# Patient Record
Sex: Male | Born: 1935 | Race: White | Hispanic: No | Marital: Married | Smoking: Never smoker
Health system: Southern US, Community
[De-identification: ages and names within clinical notes are randomized; demographics above are authoritative.]

## PROBLEM LIST (undated history)

## (undated) DIAGNOSIS — E785 Hyperlipidemia, unspecified: Secondary | ICD-10-CM

## (undated) DIAGNOSIS — K5901 Slow transit constipation: Secondary | ICD-10-CM

## (undated) DIAGNOSIS — D62 Acute posthemorrhagic anemia: Secondary | ICD-10-CM

## (undated) DIAGNOSIS — S7291XA Unspecified fracture of right femur, initial encounter for closed fracture: Secondary | ICD-10-CM

## (undated) DIAGNOSIS — I2510 Atherosclerotic heart disease of native coronary artery without angina pectoris: Secondary | ICD-10-CM

## (undated) DIAGNOSIS — I10 Essential (primary) hypertension: Secondary | ICD-10-CM

## (undated) DIAGNOSIS — E559 Vitamin D deficiency, unspecified: Secondary | ICD-10-CM

## (undated) DIAGNOSIS — F419 Anxiety disorder, unspecified: Secondary | ICD-10-CM

## (undated) DIAGNOSIS — R339 Retention of urine, unspecified: Secondary | ICD-10-CM

## (undated) DIAGNOSIS — R2681 Unsteadiness on feet: Secondary | ICD-10-CM

## (undated) DIAGNOSIS — F0390 Unspecified dementia without behavioral disturbance: Secondary | ICD-10-CM

## (undated) HISTORY — DX: Atherosclerotic heart disease of native coronary artery without angina pectoris: I25.10

## (undated) HISTORY — DX: Unspecified fracture of right femur, initial encounter for closed fracture: S72.91XA

## (undated) HISTORY — DX: Unsteadiness on feet: R26.81

## (undated) HISTORY — DX: Retention of urine, unspecified: R33.9

## (undated) HISTORY — DX: Vitamin D deficiency, unspecified: E55.9

## (undated) HISTORY — DX: Anxiety disorder, unspecified: F41.9

## (undated) HISTORY — DX: Acute posthemorrhagic anemia: D62

## (undated) HISTORY — PX: INGUINAL HERNIA REPAIR: SUR1180

## (undated) HISTORY — PX: REPLACEMENT TOTAL KNEE BILATERAL: SUR1225

## (undated) HISTORY — DX: Hyperlipidemia, unspecified: E78.5

## (undated) HISTORY — DX: Slow transit constipation: K59.01

## (undated) HISTORY — DX: Essential (primary) hypertension: I10

---

## 2001-04-17 ENCOUNTER — Ambulatory Visit (HOSPITAL_COMMUNITY): Admission: RE | Admit: 2001-04-17 | Discharge: 2001-04-17 | Payer: Self-pay | Admitting: *Deleted

## 2002-11-14 ENCOUNTER — Ambulatory Visit (HOSPITAL_COMMUNITY): Admission: RE | Admit: 2002-11-14 | Discharge: 2002-11-14 | Payer: Self-pay | Admitting: Surgery

## 2002-11-14 ENCOUNTER — Encounter: Payer: Self-pay | Admitting: Surgery

## 2003-04-05 HISTORY — PX: CORONARY ANGIOPLASTY WITH STENT PLACEMENT: SHX49

## 2003-08-19 ENCOUNTER — Inpatient Hospital Stay (HOSPITAL_COMMUNITY): Admission: EM | Admit: 2003-08-19 | Discharge: 2003-08-22 | Payer: Self-pay | Admitting: *Deleted

## 2003-09-08 ENCOUNTER — Encounter (HOSPITAL_COMMUNITY): Admission: RE | Admit: 2003-09-08 | Discharge: 2003-12-07 | Payer: Self-pay | Admitting: Cardiovascular Disease

## 2003-12-08 ENCOUNTER — Encounter (HOSPITAL_COMMUNITY): Admission: RE | Admit: 2003-12-08 | Discharge: 2004-03-07 | Payer: Self-pay | Admitting: Cardiovascular Disease

## 2004-02-06 ENCOUNTER — Ambulatory Visit: Payer: Self-pay | Admitting: Cardiovascular Disease

## 2004-07-21 ENCOUNTER — Ambulatory Visit: Payer: Self-pay | Admitting: Cardiovascular Disease

## 2004-07-22 ENCOUNTER — Ambulatory Visit: Payer: Self-pay

## 2004-08-03 ENCOUNTER — Ambulatory Visit: Payer: Self-pay | Admitting: Cardiovascular Disease

## 2004-08-20 ENCOUNTER — Ambulatory Visit: Payer: Self-pay | Admitting: Cardiovascular Disease

## 2005-02-04 ENCOUNTER — Ambulatory Visit: Payer: Self-pay | Admitting: Cardiovascular Disease

## 2005-05-03 ENCOUNTER — Ambulatory Visit: Payer: Self-pay | Admitting: Cardiovascular Disease

## 2005-06-29 ENCOUNTER — Ambulatory Visit: Payer: Self-pay | Admitting: Cardiovascular Disease

## 2005-10-17 ENCOUNTER — Ambulatory Visit (HOSPITAL_COMMUNITY): Admission: RE | Admit: 2005-10-17 | Discharge: 2005-10-17 | Payer: Self-pay | Admitting: Chiropractic Medicine

## 2006-01-02 ENCOUNTER — Ambulatory Visit: Payer: Self-pay | Admitting: Cardiovascular Disease

## 2006-07-12 ENCOUNTER — Ambulatory Visit: Payer: Self-pay | Admitting: Cardiovascular Disease

## 2006-07-26 ENCOUNTER — Encounter: Admission: RE | Admit: 2006-07-26 | Discharge: 2006-07-26 | Payer: Self-pay | Admitting: Family Medicine

## 2006-09-06 ENCOUNTER — Inpatient Hospital Stay (HOSPITAL_COMMUNITY): Admission: RE | Admit: 2006-09-06 | Discharge: 2006-09-07 | Payer: Self-pay | Admitting: Neurosurgery

## 2006-10-04 ENCOUNTER — Ambulatory Visit: Payer: Self-pay | Admitting: Cardiovascular Disease

## 2006-11-27 ENCOUNTER — Ambulatory Visit: Payer: Self-pay | Admitting: Cardiovascular Disease

## 2006-11-27 ENCOUNTER — Ambulatory Visit: Payer: Self-pay

## 2007-04-17 ENCOUNTER — Ambulatory Visit: Payer: Self-pay | Admitting: Cardiovascular Disease

## 2007-08-29 ENCOUNTER — Emergency Department (HOSPITAL_COMMUNITY): Admission: EM | Admit: 2007-08-29 | Discharge: 2007-08-29 | Payer: Self-pay | Admitting: Emergency Medicine

## 2008-01-25 ENCOUNTER — Ambulatory Visit: Payer: Self-pay | Admitting: Cardiovascular Disease

## 2008-03-10 ENCOUNTER — Inpatient Hospital Stay (HOSPITAL_COMMUNITY): Admission: RE | Admit: 2008-03-10 | Discharge: 2008-03-14 | Payer: Self-pay | Admitting: Orthopedic Surgery

## 2008-05-23 ENCOUNTER — Encounter: Payer: Self-pay | Admitting: Cardiovascular Disease

## 2008-05-23 LAB — CONVERTED CEMR LAB
AST: 17 units/L
Total Bilirubin: 0.85 mg/dL

## 2008-07-17 ENCOUNTER — Telehealth: Payer: Self-pay | Admitting: Cardiovascular Disease

## 2008-07-21 ENCOUNTER — Encounter: Payer: Self-pay | Admitting: Cardiovascular Disease

## 2008-07-21 ENCOUNTER — Ambulatory Visit: Payer: Self-pay | Admitting: Cardiovascular Disease

## 2008-07-21 DIAGNOSIS — Z9889 Other specified postprocedural states: Secondary | ICD-10-CM | POA: Insufficient documentation

## 2008-07-21 DIAGNOSIS — E782 Mixed hyperlipidemia: Secondary | ICD-10-CM

## 2008-07-21 DIAGNOSIS — I10 Essential (primary) hypertension: Secondary | ICD-10-CM | POA: Insufficient documentation

## 2008-07-21 DIAGNOSIS — I2510 Atherosclerotic heart disease of native coronary artery without angina pectoris: Secondary | ICD-10-CM

## 2008-07-21 LAB — CONVERTED CEMR LAB
HDL: 68 mg/dL
LDL Cholesterol: 41 mg/dL
Total CHOL/HDL Ratio: 1.7

## 2008-12-11 ENCOUNTER — Encounter (INDEPENDENT_AMBULATORY_CARE_PROVIDER_SITE_OTHER): Payer: Self-pay | Admitting: *Deleted

## 2009-01-23 ENCOUNTER — Encounter: Payer: Self-pay | Admitting: Cardiovascular Disease

## 2009-01-28 ENCOUNTER — Ambulatory Visit: Payer: Self-pay | Admitting: Cardiovascular Disease

## 2009-03-18 ENCOUNTER — Telehealth: Payer: Self-pay | Admitting: Internal Medicine

## 2009-05-15 ENCOUNTER — Encounter: Payer: Self-pay | Admitting: Cardiovascular Disease

## 2009-06-04 ENCOUNTER — Encounter (INDEPENDENT_AMBULATORY_CARE_PROVIDER_SITE_OTHER): Payer: Self-pay | Admitting: *Deleted

## 2009-07-20 ENCOUNTER — Telehealth (INDEPENDENT_AMBULATORY_CARE_PROVIDER_SITE_OTHER): Payer: Self-pay | Admitting: *Deleted

## 2009-07-23 ENCOUNTER — Ambulatory Visit: Payer: Self-pay | Admitting: Cardiovascular Disease

## 2009-07-23 ENCOUNTER — Telehealth (INDEPENDENT_AMBULATORY_CARE_PROVIDER_SITE_OTHER): Payer: Self-pay | Admitting: *Deleted

## 2010-01-19 ENCOUNTER — Ambulatory Visit: Payer: Self-pay | Admitting: Cardiovascular Disease

## 2010-01-27 ENCOUNTER — Telehealth (INDEPENDENT_AMBULATORY_CARE_PROVIDER_SITE_OTHER): Payer: Self-pay | Admitting: *Deleted

## 2010-03-18 ENCOUNTER — Ambulatory Visit: Payer: Self-pay | Admitting: Cardiovascular Disease

## 2010-05-04 ENCOUNTER — Telehealth: Payer: Self-pay | Admitting: Cardiovascular Disease

## 2010-05-04 NOTE — Progress Notes (Signed)
  Phone Note Outgoing Call   Call placed by: Deliah Goody, RN,  July 23, 2009 1:23 PM Summary of Call: pt here for appt today and was unsure of meds. pt called and meds confirmed. pt states as of today dr Eden Emms had told him to stop the amlodipine. wioll foward to dr Eden Emms to review. Deliah Goody, RN  July 23, 2009 1:24 PM   Follow-up for Phone Call        ok to stop amlodimpine Follow-up by: Colon Branch, MD, [facility],  July 24, 2009 11:54 AM     Appended Document:  pt aware

## 2010-05-04 NOTE — Assessment & Plan Note (Signed)
Summary: F6M/DM   CC:  no compliants.  History of Present Illness: Austin Gallagher is seen today for f/U of CAD.  He had a stent to the RCA in 2005.  He has had both knees replaced the left most recently last year by Dr. Despina Hick.  He is active walking his dog Beauregard.  He is not having SSCP, palpitations, dyspnea or syncope  His recent LDL is 49 with normal LFT's   His BP meds have been changed by Dr. Ronne Binning   He thinks his Cozaar is making him itchy and wonders if his memory is worse on statin Rx  Current Problems (verified): 1)  Total Knee Replacement, Hx of  (ICD-V45.89) 2)  Mixed Hyperlipidemia  (ICD-272.2) 3)  Essential Hypertension, Benign  (ICD-401.1) 4)  Coronary Atherosclerosis, Native Vessel  (ICD-414.01)  Current Medications (verified): 1)  Plavix 75 Mg Tabs (Clopidogrel Bisulfate) .... Take 1 Tablet By Mouth Once A Day 2)  Toprol Xl 25 Mg Xr24h-Tab (Metoprolol Succinate) .... Take 1/2 By Mouth Once Daily 3)  Vitamin C 500 Mg Tabs (Ascorbic Acid) .Marland Kitchen.. 1 Tab By Mouth Once Daily 4)  Amlodipine Besylate 10 Mg Tabs (Amlodipine Besylate) .Marland Kitchen.. 1 Once Daily  Allergies (verified): No Known Drug Allergies  Past History:  Past Medical History: Last updated: 07/21/2008 CAD:  Stent RCA 2005 Downey IMI Bilateral knee replacement Inguinal Hernia Repair Hyperlipidemia  Family History: Last updated: 07/21/2008 non-contributory  Social History: Last updated: 07/21/2008 Married Daughter lives on [address] Non-smoker Non-drinker Enjoys golf  Review of Systems       Denies fever, malais, weight loss, blurry vision, decreased visual acuity, cough, sputum, SOB, hemoptysis, pleuritic pain, palpitaitons, heartburn, abdominal pain, melena, lower extremity edema, claudication, or rash.   Vital Signs:  Patient profile:   77 year old male Height:      66 inches Weight:      133 pounds BMI:     21.54 Pulse rate:   59 / minute Resp:     14 per minute BP sitting:   143 / 72   (left arm)  Vitals Entered By: Kem Parkinson (January 19, 2010 2:27 PM)  Physical Exam  General:  Affect appropriate Healthy:  appears stated age HEENT: normal Neck supple with no adenopathy JVP normal no bruits no thyromegaly Lungs clear with no wheezing and good diaphragmatic motion Heart:  S1/S2 no murmur,rub, gallop or click PMI normal Abdomen: benighn, BS positve, no tenderness, no AAA no bruit.  No HSM or HJR Distal pulses intact with no bruits No edema Neuro non-focal Skin warm and dry    Impression & Recommendations:  Problem # 1:  MIXED HYPERLIPIDEMIA (ICD-272.2) Hold lipitor for 8 weeks and see if memory improves.  Sustpect it is more age/dementia related The following medications were removed from the medication list:    Lipitor 40 Mg Tabs (Atorvastatin calcium) .Marland Kitchen... Take one tablet by mouth daily.  Problem # 2:  ESSENTIAL HYPERTENSION, BENIGN (ICD-401.1) D/C Cozzaar.  Issues with Cr/K make calcuim blocker best choice for him The following medications were removed from the medication list:    Hyzaar 100-12.5 Mg Tabs (Losartan potassium-hctz) .Marland Kitchen... Take 1 tablet by mouth once a day    Amlodipine Besylate 5 Mg Tabs (Amlodipine besylate) .Marland Kitchen... 1 tab by mouth once daily His updated medication list for this problem includes:    Toprol Xl 25 Mg Xr24h-tab (Metoprolol succinate) .Marland Kitchen... Take 1/2 by mouth once daily    Amlodipine Besylate 10 Mg Tabs (Amlodipine besylate) .Marland KitchenMarland KitchenMarland KitchenMarland Kitchen  1 once daily  Problem # 5:  CORONARY ATHEROSCLEROSIS, NATIVE VESSEL (ICD-414.01) Stabel no angina continue Plavix and asa The following medications were removed from the medication list:    Amlodipine Besylate 5 Mg Tabs (Amlodipine besylate) .Marland Kitchen... 1 tab by mouth once daily His updated medication list for this problem includes:    Plavix 75 Mg Tabs (Clopidogrel bisulfate) .Marland Kitchen... Take 1 tablet by mouth once a day    Toprol Xl 25 Mg Xr24h-tab (Metoprolol succinate) .Marland Kitchen... Take 1/2 by mouth once  daily    Amlodipine Besylate 10 Mg Tabs (Amlodipine besylate) .Marland Kitchen... 1 once daily  Orders: EKG w/ Interpretation (93000)  Patient Instructions: 1)  Your physician recommends that you schedule a follow-up appointment in: 10 weeks with dr Eden Emms 2)  Your physician has recommended you make the following change in your medication: STOP LIPITOR AND COZAAR 3)  START AMLODIPINE 10 MG Prescriptions: AMLODIPINE BESYLATE 10 MG TABS (AMLODIPINE BESYLATE) 1 once daily  #7 x 0   Entered by:   Scherrie Bateman, LPN   Authorized by:   Colon Branch, MD, [facility]   Signed by:   Scherrie Bateman, LPN on 32/95/1884   Method used:   Electronically to        [facility]* (retail)       [address]       [place], [place]  [address]       Ph: [phone]       Fax: [phone]   RxID:   [id number] AMLODIPINE BESYLATE 10 MG TABS (AMLODIPINE BESYLATE) 1 once daily  #90 x 3   Entered by:   Scherrie Bateman, LPN   Authorized by:   Colon Branch, MD, [facility]   Signed by:   Scherrie Bateman, LPN on 73/71/0626   Method used:   Faxed to ...       [facility] (mail-order)             , [place]         Ph:        Fax: [phone]   RxID:   [id number]    EKG Report  Procedure date:  01/19/2010  Findings:      SR 59 Normal ECG

## 2010-05-04 NOTE — Assessment & Plan Note (Signed)
Summary: f24m/dm   History of Present Illness: Austin Gallagher is seen today for f/U of CAD.  He had a stent to the RCA in 2005.  He has had both knees replaced the left most recently last year by Dr. Despina Hick.  He is active walking his dog Beauregard.  He is not having SSCP, palpitations, dyspnea or syncope  His recent LDL is 49 with normal LFT's   His BP meds have been changed by Dr. Ronne Binning   He may be on both vasotec and Hyzaar.  The enalapril is cause pruritis and throat "tickling"  I told him to stop this and call us with his exact med list.  Hi  Current Medications (verified): 1)  Hyzaar 100-12.5 Mg Tabs (Losartan Potassium-Hctz) .... Take 1 Tablet By Mouth Once A Day 2)  Lipitor 40 Mg Tabs (Atorvastatin Calcium) .... Take One Tablet By Mouth Daily. 3)  Plavix 75 Mg Tabs (Clopidogrel Bisulfate) .... Take 1 Tablet By Mouth Once A Day 4)  Toprol Xl 25 Mg Xr24h-Tab (Metoprolol Succinate) .... Take 1/2 By Mouth Once Daily 5)  Amlodipine Besylate 5 Mg Tabs (Amlodipine Besylate) .Marland Kitchen.. 1 Tab By Mouth Once Daily  Allergies (verified): No Known Drug Allergies  Past History:  Past Medical History: Last updated: 07/21/2008 CAD:  Stent RCA 2005 Downey IMI Bilateral knee replacement Inguinal Hernia Repair Hyperlipidemia  Family History: Last updated: 07/21/2008 non-contributory  Social History: Last updated: 07/21/2008 Married Daughter lives on [address] Non-smoker Non-drinker Enjoys golf  Review of Systems       Denies fever, malais, weight loss, blurry vision, decreased visual acuity, cough, sputum, SOB, hemoptysis, pleuritic pain, palpitaitons, heartburn, abdominal pain, melena, lower extremity edema, claudication, or rash.    Impression & Recommendations:  Problem # 1:  MIXED HYPERLIPIDEMIA (ICD-272.2) Good control with no side effects His updated medication list for this problem includes:    Lipitor 40 Mg Tabs (Atorvastatin calcium) .Marland Kitchen... Take one tablet by mouth  daily.  CHOL: 118 (07/21/2008)   LDL: 41 (07/21/2008)   HDL: 68 (07/21/2008)   TG: 43 (07/21/2008)  Problem # 2:  ESSENTIAL HYPERTENSION, BENIGN (ICD-401.1) Well controlled will clarify meds His updated medication list for this problem includes:    Hyzaar 100-12.5 Mg Tabs (Losartan potassium-hctz) .Marland Kitchen... Take 1 tablet by mouth once a day    Toprol Xl 25 Mg Xr24h-tab (Metoprolol succinate) .Marland Kitchen... Take 1/2 by mouth once daily    Amlodipine Besylate 5 Mg Tabs (Amlodipine besylate) .Marland Kitchen... 1 tab by mouth once daily  Problem # 3:  CORONARY ATHEROSCLEROSIS, NATIVE VESSEL (ICD-414.01) Stable no angina His updated medication list for this problem includes:    Plavix 75 Mg Tabs (Clopidogrel bisulfate) .Marland Kitchen... Take 1 tablet by mouth once a day    Toprol Xl 25 Mg Xr24h-tab (Metoprolol succinate) .Marland Kitchen... Take 1/2 by mouth once daily    Amlodipine Besylate 5 Mg Tabs (Amlodipine besylate) .Marland Kitchen... 1 tab by mouth once daily  Patient Instructions: 1)  Your physician recommends that you schedule a follow-up appointment in: 6 months 2)  CALL ME WITH YOUR MEDICATION

## 2010-05-04 NOTE — Letter (Signed)
Summary: Appointment - Reminder 2  [facility], Main Office  [address]   [place], [place] [address]   Phone: [phone]  Fax: [phone]     June 04, 2009 MRN: [id number]   Austin Gallagher [address] [place], [place]  [address]   Dear [name],  Our records indicate that it is time to schedule a follow-up appointment with Dr. Eden Emms. It is very important that we reach you to schedule this appointment. We look forward to participating in your health care needs. Please contact us at the number listed above at your earliest convenience to schedule your appointment.  If you are unable to make an appointment at this time, give [place] a call so we can update our records.     Sincerely,   [facility] Scheduling Team

## 2010-05-04 NOTE — Progress Notes (Signed)
  Phone Note Call from Patient   Caller: Patient Summary of Call: pt was seen 01/19/10 by dr Eden Emms and was started on amlodipine 10mg  once daily. he called to report swelling in his feet. he denies SOB. will foward for dr Eden Emms review Deliah Goody, RN  January 27, 2010 5:20 PM   Follow-up for Phone Call        Mr. Shells calls today b/c he started itching and his ankles started to swell after taking amlodipine.  He has d/c'd medication and symptoms have resolved.  Mr. Radler would like to know what other medication does Dr. Eden Emms recommend.  I will forward this to Dr. Eden Emms for his review. Mylo Red RN     Appended Document:  Altace 5 mg  Appended Document:  Pt aware of new med. Mylo Red RN   Clinical Lists Changes  Medications: Changed medication from AMLODIPINE BESYLATE 10 MG TABS (AMLODIPINE BESYLATE) 1 once daily to RAMIPRIL 5 MG CAPS (RAMIPRIL) Take 1 tablet daily - Signed Rx of RAMIPRIL 5 MG CAPS (RAMIPRIL) Take 1 tablet daily;  #30 x 6;  Signed;  Entered by: Lisabeth Devoid RN;  Authorized by: Colon Branch, MD, [facility];  Method used: Electronically to [facility]*, [address], [place], [place]  [address], Ph: [phone], Fax: [phone]    Prescriptions: RAMIPRIL 5 MG CAPS (RAMIPRIL) Take 1 tablet daily  #30 x 6   Entered by:   Lisabeth Devoid RN   Authorized by:   Colon Branch, MD, [facility]   Signed by:   Lisabeth Devoid RN on 01/29/2010   Method used:   Electronically to        [facility]* (retail)       [address]       [place], [place]  [address]       Ph: [phone]       Fax: [phone]   RxID:   [id number]

## 2010-05-04 NOTE — Letter (Signed)
Summary: [facility] Office Note  [facility] Office Note   Imported By: Roderic Ovens 06/19/2009 15:31:05  _____________________________________________________________________  External Attachment:    Type:   Image     Comment:   External Document

## 2010-05-04 NOTE — Progress Notes (Signed)
Summary: refill 90 x3 [facility]  Phone Note Refill Request Message from:  Patient on July 20, 2009 10:54 AM  Refills Requested: Medication #1:  PLAVIX 75 MG TABS Take 1 tablet by mouth once a day [facility] [id number]  Initial call taken by: Judie Grieve,  July 20, 2009 10:55 AM  Follow-up for Phone Call        Rx faxed to pharmacy [facility] Follow-up by: Oswald Hillock,  July 20, 2009 11:25 AM    Prescriptions: PLAVIX 75 MG TABS (CLOPIDOGREL BISULFATE) Take 1 tablet by mouth once a day  #90 x 3   Entered by:   Oswald Hillock   Authorized by:   Colon Branch, MD, [facility]   Signed by:   Oswald Hillock on 07/20/2009   Method used:   Electronically to        [facility]* (mail-order)             ,          Ph: [phone]       Fax: [phone]   RxID:   [id number]

## 2010-05-06 NOTE — Assessment & Plan Note (Signed)
Summary: PER CHECK OUT/SF   Visit Type:  Follow-up  CC:  No cardiac complaints.  History of Present Illness: Austin Gallagher is seen today for f/U of CAD.  He had a stent to the RCA in 2005.  He has had both knees replaced the left most recently last year by Dr. Despina Hick.  He is active walking his dog Beauregard.  He is not having SSCP, palpitations, dyspnea or syncope  His recent LDL is 49 with normal LFT's   His BP meds have been changed by Dr. Ronne Binning   He thinks his Cozaar is making him itchy and wonders if his memory is worse on statin Rx  Current Problems (verified): 1)  Total Knee Replacement, Hx of  (ICD-V45.89) 2)  Mixed Hyperlipidemia  (ICD-272.2) 3)  Essential Hypertension, Benign  (ICD-401.1) 4)  Coronary Atherosclerosis, Native Vessel  (ICD-414.01)  Current Medications (verified): 1)  Plavix 75 Mg Tabs (Clopidogrel Bisulfate) .... Take 1 Tablet By Mouth Once A Day 2)  Toprol Xl 25 Mg Xr24h-Tab (Metoprolol Succinate) .... Take 1/2 By Mouth Once Daily 3)  Vitamin C 500 Mg Tabs (Ascorbic Acid) .Marland Kitchen.. 1 Tab By Mouth Once Daily 4)  Ramipril 10 Mg Caps (Ramipril) .... Take One Capsule By Mouth Daily  Allergies (verified): 1)  ! * Amlodipine  Past History:  Past Medical History: Last updated: 07/21/2008 CAD:  Stent RCA 2005 Downey IMI Bilateral knee replacement Inguinal Hernia Repair Hyperlipidemia  Family History: Last updated: 07/21/2008 non-contributory  Social History: Last updated: 07/21/2008 Married Daughter lives on [address] Non-smoker Non-drinker Enjoys golf  Review of Systems       Denies fever, malais, weight loss, blurry vision, decreased visual acuity, cough, sputum, SOB, hemoptysis, pleuritic pain, palpitaitons, heartburn, abdominal pain, melena, lower extremity edema, claudication, or rash.   Vital Signs:  Patient profile:   77 year old male Height:      66 inches Weight:      131 pounds BMI:     21.22 Pulse rate:   68 / minute Pulse  rhythm:   regular Resp:     18 per minute BP sitting:   164 / 80  (left arm) Cuff size:   regular  Vitals Entered By: Austin Gallagher (March 18, 2010 11:14 AM)  Physical Exam  General:  Affect appropriate Healthy:  appears stated age HEENT: normal Neck supple with no adenopathy JVP normal no bruits no thyromegaly Lungs clear with no wheezing and good diaphragmatic motion Heart:  S1/S2 no murmur,rub, gallop or click PMI normal Abdomen: benighn, BS positve, no tenderness, no AAA no bruit.  No HSM or HJR Distal pulses intact with no bruits No edema Neuro non-focal Skin warm and dry    Impression & Recommendations:  Problem # 1:  MIXED HYPERLIPIDEMIA (ICD-272.2) Stable contineu current Rx CHOL: 118 (07/21/2008)   LDL: 41 (07/21/2008)   HDL: 68 (07/21/2008)   TG: 43 (07/21/2008)  Problem # 2:  ESSENTIAL HYPERTENSION, BENIGN (ICD-401.1) Increase ramipril for better control His updated medication list for this problem includes:    Toprol Xl 25 Mg Xr24h-tab (Metoprolol succinate) .Marland Kitchen... Take 1/2 by mouth once daily    Ramipril 10 Mg Caps (Ramipril) .Marland Kitchen... Take one capsule by mouth daily  Problem # 3:  CORONARY ATHEROSCLEROSIS, NATIVE VESSEL (ICD-414.01) Stable no angina  Continue antiplatlet and BB His updated medication list for this problem includes:    Plavix 75 Mg Tabs (Clopidogrel bisulfate) .Marland Kitchen... Take 1 tablet by mouth once a day  Toprol Xl 25 Mg Xr24h-tab (Metoprolol succinate) .Marland Kitchen... Take 1/2 by mouth once daily    Ramipril 10 Mg Caps (Ramipril) .Marland Kitchen... Take one capsule by mouth daily  Patient Instructions: 1)  Your physician recommends that you schedule a follow-up appointment in: 3 MONTHS 2)  Your physician has recommended you make the following change in your medication: INCREASE RAMIPRIL 10MG  ONCE DAILY Prescriptions: RAMIPRIL 10 MG CAPS (RAMIPRIL) Take one capsule by mouth daily  #30 x 12   Entered by:   Deliah Goody, RN   Authorized by:   Colon Branch,  MD, [facility]   Signed by:   Deliah Goody, RN on 03/18/2010   Method used:   Electronically to        [facility]* (retail)       [address]       [place], [place]  [address]       Ph: [phone]       Fax: [phone]   RxID:   [id number]

## 2010-05-12 NOTE — Progress Notes (Signed)
Summary: refill  Phone Note Refill Request Call back at [phone] [facility] Message from:  Pharmacy on May 04, 2010 11:18 AM  Refills Requested: Medication #1:  RAMIPRIL 10 MG CAPS Take one capsule by mouth daily. Need a new prescription for new dosage for this medocation  Initial call taken by: Judie Grieve,  May 04, 2010 11:19 AM Caller: [facility]*    Prescriptions: RAMIPRIL 10 MG CAPS (RAMIPRIL) Take one capsule by mouth daily  #30 x 12   Entered by:   Kem Parkinson   Authorized by:   Colon Branch, MD, [facility]   Signed by:   Kem Parkinson on 05/04/2010   Method used:   Electronically to        [facility]* (retail)       [address]       [place], [place]  [address]       Ph: [phone]       Fax: [phone]   RxID:   [id number]

## 2010-05-12 NOTE — Progress Notes (Signed)
Summary: refill meds  Phone Note Refill Request Message from:  Pharmacy on May 04, 2010 3:30 PM  Refills Requested: Medication #1:  RAMIPRIL 10 MG CAPS Take one capsule by mouth daily. [facility] [place]- phone #  [phone].   Method Requested: Fax to Mail Away Pharmacy Initial call taken by: Lorne Skeens,  May 04, 2010 3:30 PM  Follow-up for Phone Call        called pharmacy gave pt refills 1 year supply Follow-up by: Kem Parkinson,  May 04, 2010 3:33 PM

## 2010-06-11 ENCOUNTER — Encounter: Payer: Self-pay | Admitting: Cardiovascular Disease

## 2010-06-11 ENCOUNTER — Ambulatory Visit (INDEPENDENT_AMBULATORY_CARE_PROVIDER_SITE_OTHER): Payer: Medicare Other | Admitting: Cardiovascular Disease

## 2010-06-11 DIAGNOSIS — E785 Hyperlipidemia, unspecified: Secondary | ICD-10-CM

## 2010-06-11 DIAGNOSIS — I2510 Atherosclerotic heart disease of native coronary artery without angina pectoris: Secondary | ICD-10-CM

## 2010-06-11 DIAGNOSIS — I10 Essential (primary) hypertension: Secondary | ICD-10-CM

## 2010-06-15 NOTE — Assessment & Plan Note (Signed)
Summary: PT NEEDS F3M IN MARCH FROM BUMPLIST=MJ   History of Present Illness: Austin Gallagher is seen today for f/U of CAD.  He had a stent to the RCA in 2005.  He has had both knees replaced the left most recently last year by Dr. Despina Hick.  He is active walking his dog Beauregard.  He is not having SSCP, palpitations, dyspnea or syncope  His recent LDL is 49 with normal LFT's   His BP meds have been changed by Dr. Ronne Binning   He thinks his Cozaar is making him itchy and wonders if his memory is worse on statin Rx  The itchyness is better on ramipril and he prefers to staty off his statin    Current Problems (verified): 1)  Total Knee Replacement, Hx of  (ICD-V45.89) 2)  Mixed Hyperlipidemia  (ICD-272.2) 3)  Essential Hypertension, Benign  (ICD-401.1) 4)  Coronary Atherosclerosis, Native Vessel  (ICD-414.01)  Current Medications (verified): 1)  Plavix 75 Mg Tabs (Clopidogrel Bisulfate) .... Take 1 Tablet By Mouth Once A Day 2)  Toprol Xl 25 Mg Xr24h-Tab (Metoprolol Succinate) .... Take 1/2 By Mouth Once Daily 3)  Vitamin C 500 Mg Tabs (Ascorbic Acid) .Marland Kitchen.. 1 Tab By Mouth Once Daily 4)  Ramipril 10 Mg Caps (Ramipril) .... Take One Capsule By Mouth Daily  Allergies (verified): 1)  ! * Amlodipine  Past History:  Past Medical History: Last updated: 07/21/2008 CAD:  Stent RCA 2005 Downey IMI Bilateral knee replacement Inguinal Hernia Repair Hyperlipidemia  Family History: Last updated: 07/21/2008 non-contributory  Social History: Last updated: 07/21/2008 Married Daughter lives on [address] Non-smoker Non-drinker Enjoys golf  Review of Systems       Denies fever, malais, weight loss, blurry vision, decreased visual acuity, cough, sputum, SOB, hemoptysis, pleuritic pain, palpitaitons, heartburn, abdominal pain, melena, lower extremity edema, claudication, or rash.   Vital Signs:  Patient profile:   77 year old male Height:      66 inches Weight:      132 pounds BMI:      21.38 Pulse rate:   70 / minute Resp:     16 per minute BP sitting:   139 / 74  (left arm)  Vitals Entered By: Kem Parkinson (June 11, 2010 9:17 AM)  Physical Exam  General:  Affect appropriate Healthy:  appears stated age HEENT: normal Neck supple with no adenopathy JVP normal no bruits no thyromegaly Lungs clear with no wheezing and good diaphragmatic motion Heart:  S1/S2 no murmur,rub, gallop or click PMI normal Abdomen: benighn, BS positve, no tenderness, no AAA no bruit.  No HSM or HJR Distal pulses intact with no bruits No edema Neuro non-focal Skin warm and dry    Impression & Recommendations:  Problem # 1:  MIXED HYPERLIPIDEMIA (ICD-272.2) Statin stopped due to memory issues.  Continue diet Rx  Problem # 2:  ESSENTIAL HYPERTENSION, BENIGN (ICD-401.1) Improved with ramipril  Low sodium diet His updated medication list for this problem includes:    Toprol Xl 25 Mg Xr24h-tab (Metoprolol succinate) .Marland Kitchen... Take 1/2 by mouth once daily    Ramipril 10 Mg Caps (Ramipril) .Marland Kitchen... Take one capsule by mouth daily  Problem # 3:  CORONARY ATHEROSCLEROSIS, NATIVE VESSEL (ICD-414.01) Stable no angina His updated medication list for this problem includes:    Plavix 75 Mg Tabs (Clopidogrel bisulfate) .Marland Kitchen... Take 1 tablet by mouth once a day    Toprol Xl 25 Mg Xr24h-tab (Metoprolol succinate) .Marland Kitchen... Take 1/2 by mouth once daily  Ramipril 10 Mg Caps (Ramipril) .Marland Kitchen... Take one capsule by mouth daily  Patient Instructions: 1)  Your physician wants you to follow-up in: 6 MONTHS  You will receive a reminder letter in the mail two months in advance. If you don't receive a letter, please call our office to schedule the follow-up appointment.

## 2010-07-13 ENCOUNTER — Other Ambulatory Visit: Payer: Self-pay

## 2010-07-13 MED ORDER — RAMIPRIL 10 MG PO TABS: 10.0000 mg | ORAL_TABLET | Freq: Every day | ORAL | Status: DC

## 2010-07-29 ENCOUNTER — Telehealth: Payer: Self-pay | Admitting: Cardiovascular Disease

## 2010-07-29 NOTE — Telephone Encounter (Signed)
RN s/w Pt re: stopping ramipril due to a cough that began 1 month ago. Pt reports being hard of hearing and states his cough is much worse today, but he associates the cough with ramipril. RN advised will forward to Dr Eden Emms to review and decide on medication changes and his nurse will call back. Pt verbalizes understanding.

## 2010-07-29 NOTE — Telephone Encounter (Signed)
Try cozaar 25mg 

## 2010-07-29 NOTE — Telephone Encounter (Signed)
Pt taking ramipril making him cough, needs to change med [employer], pt [phone]

## 2010-07-30 MED ORDER — LOSARTAN POTASSIUM 25 MG PO TABS: 25.0000 mg | ORAL_TABLET | Freq: Every day | ORAL | Status: DC

## 2010-07-30 NOTE — Telephone Encounter (Signed)
Left message for pt of med change Austin Gallagher

## 2010-08-17 NOTE — H&P (Signed)
NAME:  Austin Gallagher, Austin Gallagher NO.:  [id number]   MEDICAL RECORD NO.:  [id number]          PATIENT TYPE:  INP   LOCATION:                               FACILITY:  [facility]   PHYSICIAN:  Ollen Gross, M.D.    DATE OF BIRTH:  02-16-1926   DATE OF ADMISSION:  03/10/2008  DATE OF DISCHARGE:  03/14/2008                              HISTORY & PHYSICAL   CHIEF COMPLAINT:  Left knee pain.   HISTORY OF PRESENT ILLNESS:  The patient is an 77 year old male who has  seen by Dr. Lequita Halt for ongoing left knee pain.  He has had a previous  right total knee many years ago and has done well with that.  He has  significant bone-on-bone arthritis with the left knee and now presents  for total knee arthroplasty.  He has been seen preoperatively by Dr.  Charlton Haws and felt to be stable for surgery.   ALLERGIES:  NO KNOWN DRUG ALLERGIES.   CURRENT MEDICATIONS:  Lipitor, Hyzaar, Toprol, Sular, Plavix, aspirin,  multivitamins.   PAST MEDICAL HISTORY:  Hypertension, coronary arterial disease, history  of myocardial infarction 2005, status post cardiac catheterization and  coronary stenting x1, hiatal hernia.   PAST SURGICAL HISTORY:  Right total knee surgery, hernia repair.   FAMILY HISTORY:  Father with history of pneumonia.  Mother with history  of stroke.   SOCIAL HISTORY:  Married, worked for [employer] for 30 years, nonsmoker.  One  drink a day.  Three children.  Wife will be assisting with care after  surgery.  He does have three or four steps entering to a one level home.   REVIEW OF SYSTEMS:  GENERAL:  No fevers, chills, night sweats.  NEURO:  No seizures, syncope or paralysis.  RESPIRATORY:  No shortness breath, productive cough or hemoptysis.  CARDIOVASCULAR:  No chest pain, angina.  GI: No nausea, vomiting, diarrhea or constipation.  GU: No dysuria, hematuria or discharge.  MUSCULOSKELETAL:  Left knee.   PHYSICAL EXAMINATION:  VITAL SIGNS:  Pulse 72, respirations 12, blood  pressure 146/62.  GENERAL:  77 year old thin, small framed male, alert, oriented and  cooperative, pleasant, excellent historian.  HEENT: Normocephalic, atraumatic.  Pupils round and reactive.  EOMs  intact.  NECK:  Supple.  No carotid bruits.  CHEST:  Clear anterior posterior chest walls.  No rhonchi, rales or  wheezing.  HEART: Regular rate and rhythm.  No murmur, S1-S2 noted.  ABDOMEN:  Soft, nontender.  Bowel sounds present.  RECTAL:  Not done.  GENITALIA:  Not done.  EXTREMITIES:  Left knee no effusion, marked crepitus, varus malalignment  deformity.  Range of motion 5-120.  No instability.   IMPRESSION:  1. Osteoarthritis left knee.   PLAN:  The patient admitted to [facility] to undergo a left  total knee replacement arthroplasty.  Surgery will be performed by Dr.  Ollen Gross.      Alexzandrew L. Perkins, P.A.C.      Ollen Gross, M.D.  Electronically Signed    ALP/MEDQ  D:  03/09/2008  T:  03/09/2008  Job:  [id number]   cc:   [name]. Eden Emms, MD, [facility]  [address]  [address]  [place]  [place] [address]   Quita Skye. Artis Flock, M.D.  Fax: [phone]   Ollen Gross, M.D.  Fax: [phone]
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
## 2010-08-17 NOTE — Op Note (Signed)
NAME:  Austin Gallagher, Austin Gallagher NO.:  [id number]   MEDICAL RECORD NO.:  [id number]          PATIENT TYPE:  INP   LOCATION:  2899                         FACILITY:  [facility]   PHYSICIAN:  Clydene Fake, M.D.  DATE OF BIRTH:  1925/04/12   DATE OF PROCEDURE:  09/06/2006  DATE OF DISCHARGE:                               OPERATIVE REPORT   DIAGNOSIS:  Herniated nucleus pulposus, left L5-S1, with left L5  radiculopathy.   POSTOPERATIVE DIAGNOSIS:  Herniated nucleus pulposus, left L5-S1, with  left L5 radiculopathy.   PROCEDURE PERFORMED:  Left L5-S1 semi hemilaminectomy and diskectomy,  microdissection with the microscope.   SURGEON:  Clydene Fake, M.D.   ASSISTANT SURGEON:  Payton Doughty, M.D..   ANESTHESIA:  General endotracheal tube anesthesia.   ESTIMATED BLOOD LOSS:  Minimal.   DRAINS:  None.   COMPLICATIONS:  None.   REASON FOR THE PROCEDURE:  The patient is a 77 year old gentleman who  has been having left leg pain and numbness with some decreased strength,  left EHL.  The patient has not improved despite steroids and  antiinflammatory medicines, and MRI scan showed a disk herniation, left-  sided L5-S1, extending cephalad, compressing the 5 root.  The patient  was brought in for decompression.   DESCRIPTION OF PROCEDURE IN DETAIL:  The patient was brought into the  operating room and general anesthesia was induced.  The patient was  placed in the prone position on the Wilson frame where all pressure  points were padded.  The patient was prepared and draped in the sterile  fashion.  The site of incision was injected with 10 mL of 1% lidocaine  with epinephrine.  A needle was placed in the interspace.  X-ray was  obtained confirming that we were pointing at the L5-S1 interspace.  The  incision was then made in the midline over the lumbar spine and centered  where the needle was.  The incision was taken down to the fascia, and  hemostasis was obtained with  Bovie cauterization.   On the left side, subperiosteal dissection was done over the L5-S1  spinous process and lamina out to the facet.  A self-retaining retractor  was placed.  A marker was placed in the interspace.  Another x-ray was  obtained confirming our position at L5-S1.  The microscope was brought  in for microdissection.  At this point a high speed drill was used to  start the semi hemilaminectomy.  The medial facetectomy was completed  with Kerrison punches.  The ligamentum flavum was removed.  Explored the  epidural space and there was some hypertrophic ligament closing the  foramen at the 5 root it was exiting.  We removed this trying, to  decompress that nerve root.  We got down to the epidural space, found  the disk space in this lateral cephalad surface where a free fragment of  disk extended out through that up under the 5 root.  We used a nerve  hook and started removing the disk material, pulling it out, and  removing it with pituitary rongeurs.  When  we were finished, we had good  decompression.   The lateral epidural space, at both the thecal sac and the L5 nerve  root, we used curets to continue removing some hypertrophic ligament out  at this foramen.  When we were finished, we could decompress the 5 root.  The S1 root was also well decompressed.  There was no further disk  coming out from the annular tear and we did not enter the disk space.  Hemostasis was obtained with Gelfoam and thrombin.  This was irrigated  out.  We irrigated with antibiotic solution.  We had good hemostasis.  The retractors were removed.  The fascia was closed with 0 Vicryl  interrupted sutures; the subcutaneous tissues were  closed with 0-2 and  3-0 Vicryl interrupted sutures; and the skin was closed with benzoin and  Steri-Strips.  A dressing was placed.  The patient was placed back in  the supine position, awakened from anesthesia, and returned to the  recovery room in stable  condition.           ______________________________  Clydene Fake, M.D.     JRH/MEDQ  D:  09/06/2006  T:  09/07/2006  Job:  [id number]

## 2010-08-17 NOTE — Assessment & Plan Note (Signed)
[facility]                            CARDIOLOGY OFFICE NOTE   NAME:Gentile, RACE LATOUR                      MRN:          [id number]  DATE:10/04/2006                            DOB:          Apr 08, 1925    Austin Gallagher returns today for followup.  He has coronary artery disease with  previous stenting of the right coronary artery.  He had been on long-  term Plavix.  This had to be stopped on two occasions recently.  He had  back surgery by Dr. Phoebe Perch on September 06, 2006 for a bulging disk.  Subsequently, he had a tooth pulled last week.  He is now back on  Plavix.  He has not had any significant angina or chest pain, PND, or  orthopnea.   After his back surgery, he did have a little bit of lethargy.  This  seems to have resolved.   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  This is from his sciatica and L5-S1 nerve root problems.   He said he has had good relief from the surgery.   CURRENT MEDICATIONS:  1. Toprol 25 a day.  2. Plavix 75 a day.  3. Lipitor 40 a day.  4. Prednisone 1 mg on Monday, Wednesday, Friday.  5. Fish oil and aspirin a day.  6. Hyzaar 100/12.5.  7. Sular.   PHYSICAL EXAMINATION:  GENERAL:  His exam is remarkable for a healthy-  appearing, thin elderly white male in no distress.  Affect is  appropriate.  VITAL SIGNS:  Weight is 138.  Blood pressure is 136/66.  Pulse is 65 and  regular.  Respiratory rate is 14.  He is afebrile.  HEENT:  Normal.  NECK:  Carotids are normal without bruit.  There is no JVP elevation.  No lymphadenopathy.  No thyromegaly.  LUNGS:  Clear with good diaphragmatic motion.  No wheezing.  CARDIAC:  There is an S1 and S2 with a short systolic murmur.  PMI is  normal.  ABDOMEN:  Benign.  Bowel sounds are positive.  No organomegaly.  No  tenderness.  No hepatosplenomegaly.  No hepatojugular reflux.  BACK:  He is status post recent lumbar back surgery.  The scar has  healed well.  VASCULAR:  Distal pulses are intact.  There is trace edema by the medial  malleolus.  There is no evidence of DVT.  NEURO:  Nonfocal.  MUSCULOSKELETAL:  There is no muscular weakness.   IMPRESSION:  1. Coronary disease, previous stenting in the right coronary artery.      Continue aspirin and Plavix.  Patient will have a follow-up      treadmill test in about eight weeks when he can walk better without      disrupting his back.  2. Hypercholesterolemia:  Continue Lipitor 40 mg a day.  Follow up      lipid and liver profile in six months.  3. Hypertension:  Continue drugs, particularly Hyzaar 100/12.5.  Samples were given today.  Continue low salt diet.  4. Mild peripheral edema and lower extremity edema.  I have no      indication for Neurontin.  Hopefully, this will slowly improve now      that the nerve compression is gone.  He is on low dose prednisone.      He will continue his low dose diuretic and elevate his legs at the      end of the day.   Overall, I think Austin Gallagher is doing well, and I will see him back in about  eight weeks when he has his stress test.     Theron Arista C. Eden Emms, MD, [facility]  Electronically Signed    PCN/MedQ  DD: 10/04/2006  DT: 10/05/2006  Job #: [id number]

## 2010-08-17 NOTE — Op Note (Signed)
NAME:  SCOUT, GUMBS NO.:  [id number]   MEDICAL RECORD NO.:  [id number]          PATIENT TYPE:  INP   LOCATION:  0003                         FACILITY:  [facility]   PHYSICIAN:  Ollen Gross, M.D.    DATE OF BIRTH:  [birth year]   DATE OF PROCEDURE:  03/10/2008  DATE OF DISCHARGE:                               OPERATIVE REPORT   PREOPERATIVE DIAGNOSIS:  Osteoarthritis, left knee.   POSTOPERATIVE DIAGNOSIS:  Osteoarthritis, left knee.   PROCEDURE:  Left total knee arthroplasty.   SURGEON:  Ollen Gross, M.D.   ASSISTANT:  Arlyn Leak, PA-C.   ANESTHESIA:  Spinal with Duramorph.   ESTIMATED BLOOD LOSS:  Minimal.   DRAINS:  None.   TOURNIQUET TIME:  33 minutes at 300 mmHg.   COMPLICATIONS:  None.   CONDITION:  Stable to recovery room.   CLINICAL NOTE:  Mr. Mcelhiney is an 77 year old male with severe end-stage  arthritis of the left knee with progressively worsening pain and  dysfunction.  He has failed nonoperative management and presents now for  left total knee arthroplasty.   PROCEDURE IN DETAIL:  After successful administration of spinal  anesthetic, a tourniquet was placed on the left thigh and left lower  extremity prepped and draped in usual sterile fashion.  Extremity is  wrapped in Esmarch, knee flexed, tourniquet inflated to 300 mmHg.  Midline incision is made with 10 blade through subcutaneous tissue to  the level of the extensor mechanism.  A fresh blade is used to make a  medial parapatellar arthrotomy.  Soft tissue over the proximal medial  tibia is subperiosteally elevated to the joint line with the knife and  into the semimembranosus bursa with a Cobb elevator.  Soft tissue  laterally is elevated with attention being paid to avoiding the patellar  tendon on tibial tubercle.  The patella subluxed laterally, knee flexed  to 90 degrees, ACL and PCL removed.  Drill was used create a starting  hole in the distal femur and the canal was thoroughly  irrigated.  The 5  degrees left valgus alignment guide is placed and referencing off  posterior condyles, rotation is marked and a block pinned to remove 10  mm off the distal femur.  Distal femoral resection is made with an  oscillating saw.  Sizing blocks placed and size 4 is the most  appropriate.  Rotation is marked at the epicondylar axis.  Size 4  cutting blocks placed and the anterior, posterior and chamfer cuts made.   Tibia subluxed forward and menisci removed.  Extramedullary tibial  alignment guide is placed referencing proximally at the medial aspect of  the tibial tubercle and distally along the second metatarsal axis and  tibial crest.  The block is pinned to remove 2 mm off the deficient  medial side.  There is a fair amount of deficiency medially.  The tibial  resection is made with an oscillating saw.  Size 4 is the most  appropriate tibial component and the proximal tibia is prepared with the  modular drill and keel punch for the size 4.  Femoral preparation  is  completed the intercondylar cut.   Size 4 mobile bearing tibial trial, size 4 posterior stabilized femoral  trial and a 12.5 mm posterior stabilized rotating platform insert trial  are placed.  I used a 12.5 because of the large that tibial cut that we  made to get down to the bottom of the defect.  Even with the 12.5 there  was a little bit of anterior-posterior play, so I went to a 15 which  allowed for full extension with excellent varus-valgus, anterior and  posterior balance.  Patella was everted and thickness measured to be 25  mm.  Freehand resection is taken to 14 mm, 41 template is placed, lug  holes were drilled, trial patella was placed and it tracks normally.  Osteophytes removed off the posterior femur with the trial in place.  All trials removed and the cut bone surfaces are prepared with pulsatile  lavage.  Cement was mixed and once ready for implantation, a size 4  mobile bearing tibia, size 4  posterior stabilized femur and 41 patella  are cemented into place.  The patella was held with a clamp.  Trial 15-  mm inserts placed, knee held in full extension and all extruded cement  removed.  When the cement was fully hardened then the permanent 15 mm  posterior stabilized rotating platform insert is placed into the tibial  tray.  The wound is copiously irrigated with saline solution and then  FloSeal injected on the posterior capsule mediolateral gutters and  suprapatellar area.  Moist sponge is placed and the tourniquet is  released for a total time of 33 minutes.  The sponge is held for 2  minutes and removed.  Minimal bleeding was encountered and bleeding that  is encountered is stopped with electrocautery.  Wound is again irrigated  with saline and then the arthrotomy closed with interrupted #1 PDS.  Flexion against gravity to 140 degrees.  Subcu closed with interrupted 2-  0 Vicryl and subcuticular running 4-0 Monocryl.  The incision is cleaned  and dried and Steri-Strips and bulky sterile dressing applied.  He is  then placed into a knee immobilizer, awakened and transferred to  recovery in stable condition.      Ollen Gross, M.D.  Electronically Signed     FA/MEDQ  D:  03/10/2008  T:  03/10/2008  Job:  [id number]

## 2010-08-17 NOTE — Assessment & Plan Note (Signed)
[facility]                            CARDIOLOGY OFFICE NOTE   NAME:Gallagher, Austin CAPANO                      MRN:          [id number]  DATE:04/17/2007                            DOB:          1925/07/02    Austin Gallagher returns today for followup. He has had previous subendocardial  MI in the inferior wall with stenting of the RCA.   He has moderate left sided disease.  He had a Myoview last August which  was nonischemic with an inferior wall scar and an EF 64% and was  considered low risk. He is not having any significant chest pain.  He is  compliant with his meds. He is active.  I actually see him when he  visits his daughter who lives on our street. He had multiple issues to  discuss. Austin Gallagher tends to read a lot of the lay literature including  newsletters from [facility] We Dr. been about stress testing  rationales and duration of Plavix use with stents.   Since I last saw Austin Gallagher, his brother actually had a couple stents  placed in [place].   REVIEW OF SYSTEMS:  Remarkable for continued paresthesias after back  surgery.  He also has significant dry skin; otherwise negative.   CURRENT MEDICATIONS:  1. Include Toprol 25 a day.  2. Plavix 75 a day.  3. Multivitamins.  4. Lipitor 40 a day.  5. Fish oil.  6. Aspirin a day.  7. Hyzaar 100/12 .5.  8. Sular.   PHYSICAL EXAMINATION:  His exam is remarkable for healthy-appearing  elderly white male in no distress.  He does have dry skin.  He may have some nummular eczema. Weight is 143,  blood pressure is 130/70, respiratory rate 14, afebrile.  HEENT:  Unremarkable.  Carotids are without bruit, no lymphadenopathy,  thyromegaly, or JVP elevation.  LUNGS:  Clear diaphragmatic motion.  No  wheezing.  S1, S2 with normal heart sounds.  PMI normal.  ABDOMEN:  Benign.  Bowel sounds positive. No bruit. No  hepatosplenomegaly. No hepatojugular reflux.  Distal pulses intact, no  edema.   His EKG shows  sinus rhythm; it is otherwise normal.   IMPRESSION:  1. Coronary disease.  Followup Myoview August 2009.  Continue aspirin      and Plavix and beta blocker.  No angina.  2. Risk factor modification.  Continue statin drug. He is due to have      a physical with Dr. Artis Flock. He will get his liver and lipids checked      at that time. Target goal would be for an LDL less than 80.  3. Labile hypertension.  His blood pressure reached near 200 with      exercise.  He has a bit of white coat syndrome. His blood pressure      was fine today at 120-130 systolic and tends to run there at home.      Would keep him on his current medications and not increase them.  4. Back pain, improvement with disk surgery.  Continued mild      paresthesias; follow-up with orthopedic  doctor.   Overall, I think Austin Gallagher is doing well.  His cardiac status appears  stable.  I also encouraged him to go see the dermatologist in regards to  his dry skin. He was encouraged to use vitamin E lotion and place oil on  his skin at the end of the shower. I also encouraged him not to use very  very hot water during his hours as this will tend to  dry up the skin  particularly in the wintertime.   I do not think any of his medicines were causing pruritus.     [name]. Eden Emms, MD, [facility]  Electronically Signed    PCN/MedQ  DD: 04/17/2007  DT: 04/17/2007  Job #: [id number]

## 2010-08-17 NOTE — Assessment & Plan Note (Signed)
[facility]                            CARDIOLOGY OFFICE NOTE   NAME:Gallagher, Austin KLEINPETER                      MRN:          [id number]  DATE:11/27/2006                            DOB:          1925-10-12    Austin Gallagher returns today for followup.  He just had a stress test today.  He is status post previous stent to the RCA in 2005.  He had some LAD  disease.  His last Myoview in April of 2006 showed an inferobasal wall  scar.  His Myoview today looked reassuring.  He did have a small  inferobasal wall infarction with no ischemia.  Anterior wall was free of  defects.  He has not had chest pain.  He just underwent back surgery and  has had some relief with the pain, still has some tingling in his left  foot.   His risk factors are well modified.  He has been compliant with his  blood pressure medicines.  His activity has been limited by his back  pain, but this is starting to increase.  He was able to exercise for 7  minutes and 30 seconds on a Bruce protocol.  He has been watching his  blood pressure at home.  They have been stable.  He was somewhat  hypertensive with exercise, up to 200 systolic.   REVIEW OF SYSTEMS:  Otherwise negative.   MEDICATIONS:  Include:  1. Toprol 25 a day.  2. Plavix 75 a day.  3. Lipitor 40 a day.  4. Prednisone intermittently.  5. Aspirin a day.  6. Hyzaar 100/12.5.  7. Sular, I believe 30 a day.   EXAMINATION:  GENERAL:  Remarkable for a thin, elderly white male in no  distress.  Affect is appropriate.  VITAL SIGNS:  Weight is 138, blood pressure is 130/66, pulse 65 and  regular.  He is afebrile.  Affect is appropriate.  HEENT:  Normal.  NECK:  Carotids normal without bruits.  There is no lymphadenopathy.  No  thyromegaly, no JVP elevation.  LUNGS:  Clear with good diaphragmatic motion, no wheezing.  HEART:  There is an S1, S2, normal heart tones.  PMI is normal.  ABDOMEN:  Benign.  Bowel sounds positive, no  tenderness, no  hepatosplenomegaly or hepatojugular reflux.  No AAA, no bruits.  EXTREMITIES:  Pulse is +3 bilaterally with no bruits.  PTs are +3.  Distal pulses are intact with no edema.  NEURO:  Nonfocal.  He does have some paresthesias in the left leg below  the knee.  There is no muscular weakness.  SKIN:  Warm and dry.   IMPRESSION:  1. Coronary disease, distant history of angioplasty of the right      coronary artery with moderate LAD disease, nonischemic Myoview.      Continue aspirin and beta blocker therapy.  2. Hyperlipidemia.  Continue Lipitor 40 a day.  Follow up lipid and      liver profile in 6 months.  3. Hypertension.  Fairly well controlled, although he does have some      exercise-induced systolic rise in blood  pressure.  I think I would      make him much weaker and sick, if I tried to treat this.  He is      consistently in the 120 to 130 range at home.  Will continue his      Hyzaar and beta blocker at their current dosages.  4. Back problems.  Follow up with surgeon, some residual paresthesias.      The patient does not want to be on prednisone to decrease the      inflammation anymore.  I will leave this up to him.  5. Overall, I think  the patient is doing well.  Will see him back in      6 months.  He will call me if he has any significant chest pain.     [name]. Eden Emms, MD, [facility]  Electronically Signed    PCN/MedQ  DD: 11/27/2006  DT: 11/27/2006  Job #: [id number]

## 2010-08-20 NOTE — Procedures (Signed)
[facility]  Patient:    Austin Gallagher, Austin Gallagher Visit Number: [id number] MRN: [id number]          Service Type: END Location: ENDO Attending Physician:  Sabino Gasser Dictated by:   Sabino Gasser, M.D. Proc. Date: 04/17/01 Admit Date:  04/17/2001   CC:         Quita Skye. Artis Flock, M.D.   Procedure Report  DATE OF BIRTH:  [birth year]  PROCEDURE PERFORMED:  Colonoscopy.  ENDOSCOPIST:  Sabino Gasser, M.D.  INDICATIONS FOR PROCEDURE:  Rectal bleeding.  Colon cancer screening.  ANESTHESIA:  Demerol 80 mg, Versed 8 mg.  DESCRIPTION OF PROCEDURE:  With the patient mildly sedated in the left lateral decubitus position, a rectal exam was performed, which was unremarkable. Subsequently, the Olympus videoscopic colonoscope was inserted in the rectum and passed under direct vision into the cecum.  The cecum was identified by the ileocecal valve and appendiceal orifice, both of which were photographed. From this point, we entered into the terminal ileum, which was also negative. The colonoscope was slowly withdrawn, taking circumferential views of the entire colonic mucosa, stopping only in the rectum, which appeared normal on direct and retroflex view and showed internal hemorrhoids on retroflex view. The endoscope was straightened and withdrawn.  Patients vital signs and pulse oximeter remained stable.  The patient tolerated the procedure well and without apparent complications.  FINDINGS:  Prep was very good using Visicol tablets.  There was scattered diverticulosis of the sigmoid colon, internal hemorrhoids, otherwise an unremarkable examination.  IMPRESSION:  Probable bleeding from the hemorrhoids assumed.  No other bleeding site noted.  Otherwise an unremarkable examination other than above.  PLAN:  Repeat examination in approximately 5 to 10 years. Dictated by:   Sabino Gasser, M.D. Attending Physician:  Sabino Gasser DD:  04/17/01 TD:  04/17/01 Job:  [id number]

## 2010-08-20 NOTE — Cardiovascular Report (Signed)
NAME:  Austin Gallagher, Austin Gallagher                         ACCOUNT NO.:  [id number]   MEDICAL RECORD NO.:  [id number]                   PATIENT TYPE:  INP   LOCATION:  6525                                 FACILITY:  [facility]   PHYSICIAN:  Salvadore Farber, M.D. [facility]         DATE OF BIRTH:  1925-11-09   DATE OF PROCEDURE:  08/20/2003  DATE OF DISCHARGE:                              CARDIAC CATHETERIZATION   PROCEDURE:  Left heart catheterization, left ventriculography, coronary  angiography, drug-eluting stent placement in the RCA, Angio-Seal closure of  the right common femoral artery.   INDICATION:  Austin Gallagher is a 77 year old gentleman without prior history of  coronary artery disease.  His only risk factor is age and sex.  He presents  with non-ST-segment elevation myocardial infarction and is referred for  diagnostic angiography.   PROCEDURAL TECHNIQUE:  Informed consent was obtained.  Under 1% lidocaine  local anesthesia, a 6 French sheath was placed in the right common femoral  artery using the modified Seldinger technique.  Diagnostic angiography and  ventriculography were performed using JL-4, JR-4, and pigtail catheters.  The case then turned to intervention.   The patient was on Angiomax as part of the ACUITY study.  Additional  Angiomax was given for the intervention per study protocol.  He was also  given 300 mg of oral Plavix.  After ACT was confirmed to be greater than 225  seconds, a 6 [place] JR-4 guide with side holes was advanced over wire and  engaged in the ostium of the RCA.  An Luge wire was advanced to the distal  RCA without difficulty.  The lesion was predilated using a 3.0 x 9-mm  Maverick at 6 atmospheres.  The lesion was then stented using a 3.0 x 18-mm  Cypher at 16 atmospheres.  The mid portion of the stent was then post  dilated using a 3.0 x 15-mm PowerSail at 18 atmospheres.  Final angiogram  demonstrated no residual stenosis, TIMI-3 flow to the distal vasculature  and  no dissection.   Arteriography via the sheath demonstrated the sheath to enter the common  femoral artery.  The arteriotomy was then closed using a 6 [place] Angio-Seal  device.  The patient was then transferred to the holding room in stable  condition having tolerated the procedure well.   COMPLICATIONS:  None.   FINDINGS:  1. LV:  115/10/18. EF 65% without regional wall motion abnormality.  2. No aortic stenosis or mitral regurgitation.  3. Left main:  305 stenosis of the mid vessel.  4. LAD:  The LAD is a very large vessel wrapping the apex of the heart to     supply the majority of the inferior wall.  There is a single diagonal     branch.  The diagonal has a 70% ostial stenosis.  There is a 30% stenosis     of the distal LAD.  5. Circumflex:  Moderate size vessel  giving rise to a single obtuse     marginal.  There is a 30% stenosis proximally.  6. RCA:  Moderate size vessel.  The LAD supplies the majority of the     inferior wall.  The RCA does supply significant portion of the posterior     lateral wall.  There was 50% ostial stenosis and a 90% stenosis of the     mid vessel.  The mid vessel lesion was treated with drug-eluting stent     placement resulting in no residual stenosis and TIMI-3 flow to the distal     vasculature.   IMPRESSION/PLAN:  Successful drug-eluting stent placement in the culprit  lesion of the mid RCA.  The patient will be treated with Plavix for one  year.  Aspirin will be continued indefinitely.  Given his acute coronary  syndrome and the results of the PROVE IT trial, high dose statin will be  initiated.  Beta blocker will also be initiated.                                               Salvadore Farber, M.D. [facility]    WED/MEDQ  D:  08/20/2003  T:  08/21/2003  Job:  [id number]   cc:   Austin Gallagher, M.D.  [address]  [place]  [place] [address]  Fax: [phone]

## 2010-08-20 NOTE — Discharge Summary (Signed)
NAME:  Austin Gallagher, Austin Gallagher                         ACCOUNT NO.:  [id number]   MEDICAL RECORD NO.:  [id number]                   PATIENT TYPE:  INP   LOCATION:  6525                                 FACILITY:  [facility]   PHYSICIAN:  Veneda Melter, M.D.                   DATE OF BIRTH:  [birth year]   DATE OF ADMISSION:  08/19/2003  DATE OF DISCHARGE:  08/22/2003                           DISCHARGE SUMMARY - REFERRING   DISCHARGE DIAGNOSES:  1. Non-ST segment elevated myocardial infarction of the inferior artery     status post stent placement to the right coronary artery.  2. Sinus bradycardia, asymptomatic and resolved.  3. Hypertension, treated.  4. Hyponatremia, improved.  5. Hypokalemia, replaced.   HOSPITAL COURSE:  This is a 77 year old male patient who has no prior  cardiac history who developed upper chest discomfort around 9:00 a.m. on the  day of admission while playing golf.  He stopped playing golf, he then went  home, ate lunch and the chest pain got better.  He went to his primary care  physician and an EKG revealed no acute changes, but he was sent to [facility].  Cardiac isoenzymes were positive for a subendocardial  myocardial infarction with a maximum Troponin of 14.81 and a maximum CK at  471 with a 70 MB fraction. He was then taken to the cardiac catheterization  laboratory and found to have a 90% right coronary artery lesion in this  area.  He had a Cypher stent placed successfully with 0% residual and Plavix  is recommended for one year.  He did have some transient sinus bradycardia  during his admission and this gradually resolved.  He was kept in the  hospital until Aug 22, 2003 and we did assure that his enzymes were trending  down before his discharge.  He was discharged to home in stable condition.   LABORATORY DATA:  Hemoglobin 11.7, hematocrit 34.0, platelets 223,000.  Sodium 134, potassium 4.3, BUN 12, creatinine 1.0.  Total cholesterol 139,  triglycerides 49, HDL 67, LDL 62.  CRP was 13.8.  TSH was 2.535.   EKG revealed sinus bradycardia, rate 57, with no acute ST or T wave changes.  No Q waves were identified.   DISCHARGE MEDICATIONS:  1. Enteric coated aspirin 325 mg daily.  2. Steroids and vitamins as prior to admission.  3. Vasotec 5 mg daily.  4. Lipitor 80 mg q.h.s.  5. Toprol XL 25 mg a day.  6. Plavix 75 mg once a day.  7. The patient does have polymyalgia rheumatica and is on chronic steroid     therapy.  8. Sublingual nitroglycerin p.r.n. chest pain.   ACTIVITY:  1. No strenuous activity and no driving for one week.  2. Increase activity as per cardiac rehabilitation.   DIET:  Remain on a low fat diet.   WOUND CARE:  Clean catheterization site with soap  and water and no  scrubbing.   SPECIAL INSTRUCTIONS:  Call [phone] with questions or concerns.   FOLLOW UP:  1. Follow-up with Dr. Eden Emms on September 05, 2003 at 10:45 a.m.      Guy Franco, P.A. [facility]                      Veneda Melter, M.D.    LB/MEDQ  D:  11/17/2003  T:  11/17/2003  Job:  [id number]   cc:   Charlton Haws, M.D.   Rodolph Bong, M.D.  [address] [place], [place] [address]  Fax: [phone]

## 2010-08-20 NOTE — H&P (Signed)
NAME:  SARAH, ZERBY NO.:  [id number]   MEDICAL RECORD NO.:  [id number]                   PATIENT TYPE:  EMS   LOCATION:  MAJO                                 FACILITY:  [facility]   PHYSICIAN:  Veneda Melter, M.D.                   DATE OF BIRTH:  03-Oct-1925   DATE OF ADMISSION:  08/19/2003  DATE OF DISCHARGE:                                HISTORY & PHYSICAL   CHIEF COMPLAINT:  Chest pain.   HISTORY OF PRESENT ILLNESS:  Mr. Lipsett is a 77 year old white male without  prior cardiac history who presents for assessment of substernal chest  discomfort.  The patient has a history of polymyalgia rheumatica and has  been on chronic steroid use for the last three years.  He had been fairly  active in the past;  however, over the past several months has not been able  to walk due to inclement weather.  Today while playing golf, he noted  midsternal discomfort with radiation to both arms described as a heaviness.  This was associated with weakness and nausea.  No vomiting, diaphoresis, or  shortness of breath.  He stopped playing golf and had his friend take him  home, and after about 5 minutes he had complete relief of discomfort.  Several hours later, he then called Dr. Blair Heys office.  After discussion  with Dr. Daleen Squibb in the cardiologist office, it was recommended the patient  present to the emergency room for further assessment.  Currently, he has no  discomfort.  He denies any rest or exertional pain recently.  No orthopnea,  paroxysmal nocturnal dyspnea, or lower extremity edema.  The patient does  admit to very mild discomfort yesterday which is minimalizes.  The patient  initially felt that his discomfort was due to his polymyalgia rheumatica;  however, he has not had this pain for quite some time.  Review of systems  otherwise noncontributory.   PAST MEDICAL HISTORY:  1. Right total knee replacement in 1991.  2. Right inguinal herniorrhaphy in 2004.  3.  Polymyalgia rheumatica.   ALLERGIES:  No known drug allergies.   MEDICATIONS:  1. Prednisone 2 mg daily.  2. Aspirin 81 mg daily.  3. Multivitamins.   SOCIAL HISTORY:  The patient lives in [place] with his wife, retired from  Airline pilot, has six children.  Denies a history of tobacco use, does drink one  alcoholic beverage per day.   FAMILY HISTORY:  Mother died at age 76 of stroke.  Father died at age 24 of  pneumonia.  He has no history of coronary artery disease in his siblings.   PHYSICAL EXAMINATION:  GENERAL:  He is a well-developed, well-nourished  white male in no acute distress.  VITAL SIGNS:  Temperature is 98.2, pulse 61 and regular, respirations 20,  blood pressure 183/93, O2 saturation 98%.  HEENT:  Pupils equal, round, reactive to light.  Extraocular movements were  intact.  Oropharynx shows no lesions.  NECK:  Supple, no adenopathy, no bruits.  HEART:  Regular rate, no murmurs, positive S4.  LUNGS:  Clear to auscultation.  ABDOMEN:  Soft and nontender.  EXTREMITIES:  No edema.  Peripheral pulses 2+ and equal bilaterally.  NEUROLOGIC:  Motor strength is 5/5.  Sensory is intact to touch.   LABORATORY DATA:  ECG shows normal sinus rhythm at 58, with no ST-T wave  changes.  Laboratory data is pending.   ASSESSMENT AND PLAN:  Mr. Gunn is a 77 year old white male who presents  with substernal chest discomfort with radiation to both arms with associated  weakness and nausea that is highly suggestive of symptomatic coronary  disease.  We plan on admitting the patient to the hospital and ruling out  acute myocardial infarction with serial enzymes and following his  electrocardiogram.  His blood pressure was elevated, and we will plan on  initiating antihypertensive therapy.  This may be reactive due to his acute  stress.  He has not had any history of hypertension in the past, and it  would be unusual for him to present with new onset hypertension at this age.  I  discussed further treatment options with the patient and his wife in  detail, including stress imaging study versus cardiac catheterization.  Given his symptoms, I believe that it would be prudent to proceed with  cardiac catheterization.  The risks, benefits, and alternatives were  discussed in detail, and the patient and his wife understand and agree to  proceed and this will be arranged per schedule.  In the interim, we will  also obtain lipid profile and initiate statin therapy as indicated.  The  patient does not have any shortness of breath, pulmonary embolism, or back  pain, or abnormal pulses to suggest aortic dissection.  Should his cardiac  catheterization prove unrevealing, further assessment of his  gastrointestinal system may be indicated, and we will go ahead and start him  on a proton pump inhibitor.                                                Veneda Melter, M.D.    NG/MEDQ  D:  08/19/2003  T:  08/19/2003  Job:  [id number]   cc:   Quita Skye. Artis Flock, M.D.  [address], [address]  [place]  [place] [address]  Fax: [phone]

## 2010-08-20 NOTE — Assessment & Plan Note (Signed)
[facility]                            CARDIOLOGY OFFICE NOTE   NAME:Grabe, CHAZ MCGLASSON                      MRN:          [id number]  DATE:07/12/2006                            DOB:          1925/04/13    Austin Gallagher returns today for followup. He is doing fairly well. He has had  previous angioplasty of the right coronary artery in 2005.   He denied any significant chest pain. His risk factors are well  modified. He is on Lipitor 40 a day with an LDL of 44. He was due to  have a followup stress test April 1. It has been over 2 years since his  last one; however, he came down with significant sciatica. He [place] in  [place] and sat around too much. He has been placed on  prednisone and Flexeril.   REVIEW OF SYSTEMS:  His 10-point review of systems is remarkable for  some irritability and edginess which is likely due to his Flexeril and  prednisone. He denied any significant chest pain, PND or orthopnea.  Interestingly on the prednisone, his blood pressure has been fine.   MEDICATIONS:  1. Toprol 25 a day.  2. Plavix 75 a day.  3. Lipitor 40 a day.  4. Prednisone Monday, Wednesday and Friday.  5. Aspirin a day.  6. Hyzaar 100/12.5.  7. Sular 30 a day.   His lab work from Dr. Blair Heys office looked fine. His LFTs were normal.   PHYSICAL EXAMINATION:  VITAL SIGNS:  Today the blood pressure was  130/70, pulse is a little higher than usual at 75.  HEENT:  Normal.  NECK:  Carotids normal without bruits.  LUNGS:  S1, S2 with normal heart sounds.  ABDOMEN:  Benign.  EXTREMITIES:  Lower extremities intact pulses. No edema.  NEUROLOGIC:  Nonfocal.  SKIN:  Warm and dry.  MUSCULOSKELETAL:  Remarkable for significant pain in the left hip with  leg extension or abduction.   His symptoms are consistent with sciatica.   IMPRESSION:  Stable coronary artery disease. Previous stenting of the  right coronary artery. Followup Myoview in 4 weeks or  whenever his  sciatica improves. I would like for him to walk in the treadmill which  he usually does. He will continue his aspirin, Plavix and beta blocker  therapy.   His LDL is superb on Lipitor 40 a day and he will continue this. I will  leave it up to Dr. Artis Flock in regards to following his lab work. His blood  pressure is well controlled on Hyzaar and Sular.   He will follow with Dr. Artis Flock in regards to his sciatic and weaning of  his Flexeril and prednisone. I will see him after his stress test in a  month or two.     [name]. Eden Emms, MD, [facility]  Electronically Signed    PCN/MedQ  DD: 07/12/2006  DT: 07/12/2006  Job #: [id number]

## 2010-08-20 NOTE — Op Note (Signed)
NAME:  Austin Gallagher, Austin Gallagher                         ACCOUNT NO.:  [id number]   MEDICAL RECORD NO.:  [id number]                   PATIENT TYPE:  AMB   LOCATION:  DAY                                  FACILITY[facility]  [facility]   PHYSICIAN:  Sandria Bales. Ezzard Standing, M.D.               DATE OF BIRTH:  1926/01/08   DATE OF PROCEDURE:  11/14/2002  DATE OF DISCHARGE:                                 OPERATIVE REPORT   CCS [phone].   PREOPERATIVE DIAGNOSIS:  Right inguinal hernia.   POSTOPERATIVE DIAGNOSIS:  Medium-sized direct right inguinal hernia.   PROCEDURE:  Laparoscopic right inguinal hernia repair with onlay 4 x 6 inch  Atrium mesh.   SURGEON:  Sandria Bales. Ezzard Standing, M.D.   ANESTHESIA:  General endotracheal.   ESTIMATED BLOOD LOSS:  Minimal.   INDICATION FOR PROCEDURE:  Austin Gallagher is a 77 year old white male who has a  symptomatic right inguinal hernia and now comes for repair of this hernia.  Discussed both open and laparoscopic methods, and he decided to go with a  laparoscopic repair.  Discussed the risks of the procedure with the patient.  The risks include but not are not limited to bleeding, infection, nerve  injury, and recurrence of the hernia.   Patient placed in a supine position with both his arms tucked, a Foley  catheter in place, given 1 g of Ancef at the initiation of the procedure.  He was given a bolus of steroids because he has been on low-grade low doses  of prednisone for polymyalgia for some time.   His lower abdomen was shaved and his abdomen prepped with Betadine solution  and sterilely draped.   An infraumbilical incision was made with sharp dissection carried down to  the rectus abdominis fascia onto the right side, went through the anterior  rectus abdominis fascia, retracted the rectus abdominis muscle anteriorly,  passed the preperitoneal balloon down into the preperitoneal space, and  dilated this under direct laparoscopic evaluation.   The distention of the balloon was  good.  There was no obvious peritoneal  tear.  I then removed the balloon and replaced it with the Hasson trocar  with the balloon tip.  I then insufflated the preperitoneal space and  inserted two 5 mm Apple trocars, one on the right side and one on the left  side at the level of the anterior iliac spine.  I then carried out the  dissection, identified the pubic tubercle, Cooper's ligament.  I found the  hernia was sort of lateral in the inguinal floor, was kind of a medium  direct inguinal hernia.  I isolated the cord structures themselves.  There  was no evidence of any indirect hernia.  I found the peritoneum around the  cord structures and reflected this back.   I then cut a piece of 6 x 4 Atrium mesh to about a 4 x 4 size with a  slight  taper laterally and then placed this in the preperitoneal space.  I tacked  it medial to the pubic tubercle, inferiorly to Cooper's ligament, superiorly  to the transversalis fascia.  I avoided the area lateral to the vessels and  inferior to the shelving edge of the inguinal ligament, and then out  laterally I hand-palpated where I tacked it above the iliopubic tract.   The mesh lay flat.  I took photos of this and included these in the chart.  I then inspected the ports.  I removed the ports.  There was no bleeding at  the port sites.  I desufflated the preperitoneal space, closed the fascia  with a 0 Vicryl suture, the skin with a 5-0 Vicryl suture, painted with  tincture of Benzoin, and steri-stripped it.   The patient tolerated the procedure well, was transported to the recovery  room in good condition.  He will be discharged home today and return to see  me in two weeks for follow-up.                                               Sandria Bales. Ezzard Standing, M.D.    DHN/MEDQ  D:  11/14/2002  T:  11/14/2002  Job:  [id number]   cc:   Quita Skye. Artis Flock, M.D.  [address]  [place]  [place] [address]  Fax: [phone]

## 2010-08-20 NOTE — Assessment & Plan Note (Signed)
[facility]                              CARDIOLOGY OFFICE NOTE   NAME:[name], [name]                      MRN:          [id number]  DATE:01/02/2006                            DOB:          [birth year]    Austin Gallagher returns today for follow up.  He is doing well.  He has  hypertension, hypercholesterolemia.  His blood pressure is better controlled  on Sular and Hyzaar.  Unfortunately one of his prescription got called in  incorrectly.  He has been on 25 of diuretic in his Hyzaar and does not like  this.  We will change him back to the 12.50. His home blood pressure  readings have been fine.  He needs to have a follow up lipid and liver  profile with Dr. Artis Flock.  I explained to him that his target LDL should be in  the 60 to 70 range.   He understands the importance of getting his liver checked.   From a heart disease standpoint he has had a previous angioplasty and stent  of his RCA in 2005.  He had some residual diagonal disease.  He is active.  He is not having exertional chest pain. I told him we could probably wait 6  months to do a repeat stress test.   REVIEW OF SYSTEMS:  Otherwise remarkable for some dermatological problems  with a biopsy done in his right arm.  He continues to bruise easily.   EXAM:  The blood pressure is 130/70.  Pulse is 56 and regular.  LUNGS:  Are clear.  Pupils are equal, round and reactive to light and accommodation.  NEUROLOGIC:  Was nonfocal.  Carotids were normal.  He has an abnormal calcific deposit in his right  clavicle  __________ joints and manubrium.  There is an S1, S2 with normal heart sounds.  ABDOMEN:  __________  __________ pulses, no edema.  SKIN:  Is warm and dry.   EKG is normal.   IMPRESSION:  Stable hypertension on Sular and Hyzaar.  Continue current  dosages.  1. Hypercholesterolemia on Lipitor to be followed by Dr. Artis Flock with lipid      and liver profile in January.  2. Coronary disease  stable without angina.  Continue aspirin and Plavix.  3. I will see the patient back in 6 months when he has his stress test.            ______________________________  [name]. Eden Emms, MD, [facility]     PCN/MedQ  DD:  01/02/2006  DT:  01/02/2006  Job #:  [id number]

## 2010-08-20 NOTE — Discharge Summary (Signed)
NAME:  ADYN, SERNA NO.:  [id number]   MEDICAL RECORD NO.:  [id number]          PATIENT TYPE:  INP   LOCATION:  1604                         FACILITY:  [facility]   PHYSICIAN:  Ollen Gross, M.D.    DATE OF BIRTH:  1925/11/14   DATE OF ADMISSION:  03/10/2008  DATE OF DISCHARGE:  03/14/2008                               DISCHARGE SUMMARY   ADMITTING DIAGNOSES:  1. Osteoarthritis left knee.  2. Hypertension.  3. Coronary arterial disease.  4. History of myocardial infarction 2005.  5. Status post cardiac catheterization with coronary stenting x1.  6. Hiatal hernia.   DISCHARGE DIAGNOSES:  1. Osteoarthritis left knee, status post left total knee replacement      arthroplasty.  2. Postop acute blood loss anemia.  3. Status post transfusion without sequelae.  4. Preop hyponatremia with exacerbation postop, but improved.  5. Mild postop hypokalemia.  6. Hypertension.  7. Coronary arterial disease.  8. History of myocardial infarction 2005.  9. Status post cardiac catheterization with coronary stenting x1.  10.Hiatal hernia.   PROCEDURE:  March 10, 2008 left total knee.  Surgeon, Dr. Lequita Halt.  Assistant, Oneida Alar, PA-C.   ANESTHESIA:  Spinal with Duramorph.   CONSULTS:  None.   BRIEF HISTORY:  Mr. Chovanec is an 77 year old male with severe end-stage  arthritis of left knee, progressive worsening pain and dysfunction,  failed nonoperative management now presents for total knee arthroplasty.   LABORATORY DATA:  Preop CBC hemoglobin 12.5, hematocrit 35.9, white cell  count 6, platelets 286,000.  Postop hemoglobin 9.8 drifted down to 8.3  as low as 8.1 given blood.  Post H and H back up to 8.3 and 24.1.  PT/PTT preop 14.4 and 37 respectively.  INR 1.1.  Serial pro time  followed per Coumadin protocol.  Last PT/INR 21.7 and 1.8.  Chem panel  on admission showed low sodium preop of 130.  Remaining chem panel  within normal limits.  Serial BMET followed.  Sodium  did drop down to a  low level of 125 but improved back up to a level higher than preop of  133, potassium started out of 4.4, got as low as 3.5 and came back up to  3.9.  Last noted minimally low at 3.4.  Preop UA negative.  Blood group  type A positive.   CHEST X-RAY:  March 05, 2008 hyperinflation without acute superimposed  abnormality.   EKG:  April 17, 2007 sinus bradycardia otherwise normal confirmed by  Dr. Eden Emms.   HOSPITAL COURSE:  The patient was admitted to [facility],  tolerated procedure well.  Later transferred to recovery room and the  orthopedic floor.  Started on PCA and p.o. analgesic pain control  following surgery.  Started on Coumadin for DVT prophylaxis.  Doing  pretty well on the morning of day one.  Had a little bit of pain but was  using the PCA with good control.  Encouraged p.o. meds.  A little bit of  low urinary output so we felt some of the blood pressure medications.  That was an ARB.  The patient was noted to have some low sodium  preoperatively which was followed.  Did get a little bit worse postop  down to 127 and then 125 on day one and day two, felt to be due to  delusional component.  The pressure was stable so we decreased the  fluids.  Once his output improved did have to use fluids to increase his  output.  Got up with therapy day one.  By day two was doing very well,  no complaints.  Pain was under excellent control.  Had a little bit of  hiccup, so we gave him some low-dose Thorazine.  His output picked up.  The sodium was down low.  We felt that was going to concentrate back up  since he was diuresing his fluids well.  Hemoglobin was down to 8.3.  He  did receive some blood during the hospital stay.  Started walking a  little bit, about 25 feet on day two.  By day three, that is when he  received 1 unit of blood and when it got down to 8.1 a hemoglobin came  back up.  The sodium had already turned the corner and started   improving.  He needed at least another day of therapy with the blood.  Did get up and walk about 80 feet.  Continued to improve and by postop  day #4 he was tolerating his meds.  Hemoglobin was on the mend and was  back up to 8.3.  Sodium was back up to 133, potassium was just barely  low at 3.4, doing well.  No other complaints.  He was discharged home.   DISCHARGE/PLAN:  1. Discharged home on March 14, 2008.  2. Discharge diagnoses, please see above.  3. Discharge meds:  Percocet, Robaxin, Nu-Iron, Coumadin.  4. Diet, heart-healthy diet.  5. Activity, weightbearing as tolerated.  Total knee protocol.  Home      health PT and home health nursing.  6. Follow up in 2 weeks.   DISPOSITION:  Home.   CONDITION ON DISCHARGE:  Improving.      Alexzandrew L. Perkins, P.A.C.      Ollen Gross, M.D.  Electronically Signed    ALP/MEDQ  D:  04/17/2008  T:  04/17/2008  Job:  [id number]   cc:   [name]. Eden Emms, MD, [facility]  [address]  [address]  [place]  [place] [address]   Rodolph Bong, M.D.  Fax: [phone]

## 2010-11-10 ENCOUNTER — Encounter: Payer: Self-pay | Admitting: Cardiovascular Disease

## 2010-11-16 ENCOUNTER — Other Ambulatory Visit: Payer: Self-pay | Admitting: *Deleted

## 2010-11-16 MED ORDER — METOPROLOL SUCCINATE ER 25 MG PO TB24: 12.5000 mg | ORAL_TABLET | Freq: Every day | ORAL | Status: DC

## 2010-11-17 ENCOUNTER — Other Ambulatory Visit: Payer: Self-pay | Admitting: Cardiovascular Disease

## 2010-11-17 MED ORDER — METOPROLOL SUCCINATE ER 25 MG PO TB24: 12.5000 mg | ORAL_TABLET | Freq: Every day | ORAL | Status: DC

## 2010-12-08 ENCOUNTER — Ambulatory Visit (INDEPENDENT_AMBULATORY_CARE_PROVIDER_SITE_OTHER): Payer: Medicare Other | Admitting: Cardiovascular Disease

## 2010-12-08 ENCOUNTER — Encounter: Payer: Self-pay | Admitting: Cardiovascular Disease

## 2010-12-08 DIAGNOSIS — E782 Mixed hyperlipidemia: Secondary | ICD-10-CM

## 2010-12-08 DIAGNOSIS — I2510 Atherosclerotic heart disease of native coronary artery without angina pectoris: Secondary | ICD-10-CM

## 2010-12-08 DIAGNOSIS — I10 Essential (primary) hypertension: Secondary | ICD-10-CM

## 2010-12-08 NOTE — Assessment & Plan Note (Signed)
Stable with no angina and good activity level.  Continue medical Rx  

## 2010-12-08 NOTE — Patient Instructions (Signed)
Your physician recommends that you schedule a follow-up appointment in: 5-6 WEEKS WITH DR [facility]  Your physician recommends that you continue on your current medications as directed. Please refer to the Current Medication list given to you today.

## 2010-12-08 NOTE — Assessment & Plan Note (Signed)
Cholesterol is at goal.  Continue current dose of statin and diet Rx.  No myalgias or side effects.  F/U  LFT's in 6 months. Lab Results  Component Value Date   LDLCALC 41 07/21/2008

## 2010-12-08 NOTE — Assessment & Plan Note (Signed)
Home monitoring  May need to start ACE back.  F/U 6-8 weeks

## 2010-12-08 NOTE — Progress Notes (Signed)
Austin Gallagher is seen today for f/U of CAD. He had a stent to the RCA in 2005. He has had both knees replaced the left most recently last year by Dr. Despina Hick. He is active walking his dog Beauregard. He is not having SSCP, palpitations, dyspnea or syncope His recent LDL is 49 with normal LFT's His BP meds have been changed by Dr. Ronne Binning He thinks his Cozaar is making him itchy and wonders if his memory is worse on statin Rx  BP is hight today and needs to be monitored at home  Had a gun go off in his hand and is having hearing problems  ROS: Denies fever, malais, weight loss, blurry vision, decreased visual acuity, cough, sputum, SOB, hemoptysis, pleuritic pain, palpitaitons, heartburn, abdominal pain, melena, lower extremity edema, claudication, or rash.  All other systems reviewed and negative  General: Affect appropriate Healthy:  appears stated age HEENT: normal Neck supple with no adenopathy JVP normal no bruits no thyromegaly Lungs clear with no wheezing and good diaphragmatic motion Heart:  S1/S2 no murmur,rub, gallop or click PMI normal Abdomen: benighn, BS positve, no tenderness, no AAA no bruit.  No HSM or HJR Distal pulses intact with no bruits No edema Neuro non-focal Skin warm and dry No muscular weakness   Current Outpatient Prescriptions  Medication Sig Dispense Refill  . aspirin 81 MG tablet Take 81 mg by mouth daily.        Marland Kitchen atorvastatin (LIPITOR) 10 MG tablet Take 5 mg by mouth daily.        . metoprolol succinate (TOPROL-XL) 25 MG 24 hr tablet Take 0.5 tablets (12.5 mg total) by mouth daily.  30 tablet  1    Allergies  Amlodipine  Electrocardiogram:  Assessment and Plan

## 2010-12-28 ENCOUNTER — Encounter: Payer: Self-pay | Admitting: Cardiovascular Disease

## 2010-12-28 ENCOUNTER — Other Ambulatory Visit: Payer: Self-pay | Admitting: Cardiovascular Disease

## 2010-12-28 NOTE — Telephone Encounter (Signed)
Pt at the pharmacy now.

## 2010-12-28 NOTE — Telephone Encounter (Signed)
Error

## 2010-12-28 NOTE — Telephone Encounter (Signed)
Pt called to let dr Eden Emms know he has a bad cough from the cozaar. He wants to know if there is something else he can take. Will forward for dr Eden Emms review  Deliah Goody

## 2010-12-29 LAB — POCT CARDIAC MARKERS
CKMB, poc: 1.4
Myoglobin, poc: 52.4
Operator id: 290111
Troponin i, poc: 0.07 — ABNORMAL HIGH
Troponin i, poc: <0.05

## 2010-12-29 LAB — DIFFERENTIAL
Eosinophils Absolute: 0.2
Eosinophils Relative: 2
Lymphocytes Relative: 12
Lymphs Abs: 1.3
Monocytes Absolute: 1
Monocytes Relative: 9

## 2010-12-29 LAB — COMPREHENSIVE METABOLIC PANEL
ALT: 22
AST: 28
Alkaline Phosphatase: 56
Calcium: 9.4
Potassium: 3.7
Sodium: 131 — ABNORMAL LOW
Total Protein: 6.2

## 2010-12-29 LAB — CBC
MCHC: 35.1
MCV: 99.2
Platelets: 215
RBC: 3.64 — ABNORMAL LOW
WBC: 11.4 — ABNORMAL HIGH

## 2010-12-30 MED ORDER — METOPROLOL SUCCINATE ER 25 MG PO TB24: 12.5000 mg | ORAL_TABLET | Freq: Every day | ORAL | Status: DC

## 2011-01-07 LAB — COMPREHENSIVE METABOLIC PANEL
Albumin: 4 g/dL (ref 3.5–5.2)
Alkaline Phosphatase: 54 U/L (ref 39–117)
BUN: 15 mg/dL (ref 6–23)
Creatinine, Ser: 0.99 mg/dL (ref 0.4–1.5)
Potassium: 4.4 mEq/L (ref 3.5–5.1)
Total Protein: 6.6 g/dL (ref 6.0–8.3)

## 2011-01-07 LAB — CBC
HCT: 23.3 % — ABNORMAL LOW (ref 39.0–52.0)
HCT: 23.5 % — ABNORMAL LOW (ref 39.0–52.0)
HCT: 24.1 % — ABNORMAL LOW (ref 39.0–52.0)
Hemoglobin: 12.5 g/dL — ABNORMAL LOW (ref 13.0–17.0)
Hemoglobin: 8.1 g/dL — ABNORMAL LOW (ref 13.0–17.0)
Hemoglobin: 8.3 g/dL — ABNORMAL LOW (ref 13.0–17.0)
Hemoglobin: 8.3 g/dL — ABNORMAL LOW (ref 13.0–17.0)
MCHC: 34.7 g/dL (ref 30.0–36.0)
MCHC: 34.9 g/dL (ref 30.0–36.0)
MCV: 100.7 fL — ABNORMAL HIGH (ref 78.0–100.0)
MCV: 99.1 fL (ref 78.0–100.0)
Platelets: 225 10*3/uL (ref 150–400)
Platelets: 286 10*3/uL (ref 150–400)
RBC: 2.31 MIL/uL — ABNORMAL LOW (ref 4.22–5.81)
RBC: 2.31 MIL/uL — ABNORMAL LOW (ref 4.22–5.81)
RDW: 12.3 % (ref 11.5–15.5)
RDW: 12.4 % (ref 11.5–15.5)
RDW: 13.4 % (ref 11.5–15.5)
WBC: 9.9 10*3/uL (ref 4.0–10.5)

## 2011-01-07 LAB — BASIC METABOLIC PANEL
BUN: 9 mg/dL (ref 6–23)
CO2: 26 mEq/L (ref 19–32)
Calcium: 8.1 mg/dL — ABNORMAL LOW (ref 8.4–10.5)
Calcium: 8.2 mg/dL — ABNORMAL LOW (ref 8.4–10.5)
Chloride: 97 mEq/L (ref 96–112)
Chloride: 97 mEq/L (ref 96–112)
Creatinine, Ser: 0.83 mg/dL (ref 0.4–1.5)
Creatinine, Ser: 0.98 mg/dL (ref 0.4–1.5)
GFR calc Af Amer: >60 mL/min (ref >60)
GFR calc Af Amer: >60 mL/min (ref >60)
GFR calc non Af Amer: >60 mL/min (ref >60)
GFR calc non Af Amer: >60 mL/min (ref >60)
GFR calc non Af Amer: >60 mL/min (ref >60)
Glucose, Bld: 113 mg/dL — ABNORMAL HIGH (ref 70–99)
Glucose, Bld: 135 mg/dL — ABNORMAL HIGH (ref 70–99)
Potassium: 3.4 mEq/L — ABNORMAL LOW (ref 3.5–5.1)
Potassium: 3.7 mEq/L (ref 3.5–5.1)
Potassium: 3.9 mEq/L (ref 3.5–5.1)
Sodium: 125 mEq/L — ABNORMAL LOW (ref 135–145)
Sodium: 129 mEq/L — ABNORMAL LOW (ref 135–145)
Sodium: 133 mEq/L — ABNORMAL LOW (ref 135–145)

## 2011-01-07 LAB — PROTIME-INR
INR: 1.1 (ref 0.00–1.49)
INR: 1.2 (ref 0.00–1.49)
INR: 1.6 — ABNORMAL HIGH (ref 0.00–1.49)
Prothrombin Time: 14.4 seconds (ref 11.6–15.2)
Prothrombin Time: 19.3 seconds — ABNORMAL HIGH (ref 11.6–15.2)

## 2011-01-07 LAB — URINALYSIS, ROUTINE W REFLEX MICROSCOPIC
Bilirubin Urine: NEGATIVE
Ketones, ur: NEGATIVE mg/dL
Nitrite: NEGATIVE
Protein, ur: NEGATIVE mg/dL
Urobilinogen, UA: 0.2 mg/dL (ref 0.0–1.0)
pH: 7 (ref 5.0–8.0)

## 2011-01-07 LAB — TYPE AND SCREEN
ABO/RH(D): A POS
Antibody Screen: NEGATIVE

## 2011-01-10 ENCOUNTER — Ambulatory Visit (INDEPENDENT_AMBULATORY_CARE_PROVIDER_SITE_OTHER): Payer: Medicare Other | Admitting: Cardiovascular Disease

## 2011-01-10 ENCOUNTER — Encounter: Payer: Self-pay | Admitting: Cardiovascular Disease

## 2011-01-10 VITALS — BP 199/82 | HR 60 | Ht 66.0 in | Wt 132.0 lb

## 2011-01-10 DIAGNOSIS — I2510 Atherosclerotic heart disease of native coronary artery without angina pectoris: Secondary | ICD-10-CM

## 2011-01-10 DIAGNOSIS — E782 Mixed hyperlipidemia: Secondary | ICD-10-CM

## 2011-01-10 DIAGNOSIS — I10 Essential (primary) hypertension: Secondary | ICD-10-CM

## 2011-01-10 MED ORDER — METOPROLOL SUCCINATE ER 25 MG PO TB24: 12.5000 mg | ORAL_TABLET | Freq: Every day | ORAL | Status: DC

## 2011-01-10 NOTE — Patient Instructions (Signed)
Your physician wants you to follow-up in: 6 MONTHS WITH DR Haywood Filler will receive a reminder letter in the mail two months in advance. If you don't receive a letter, please call our office to schedule the follow-up appointment.  Your physician has recommended you make the following change in your medication: STOP RAMIPRIL  AND START DIOVAN 320 MG 1 EVERY DAY

## 2011-01-10 NOTE — Assessment & Plan Note (Signed)
Cholesterol is at goal.  Continue current dose of statin and diet Rx.  No myalgias or side effects.  F/U  LFT's in 6 months. Lab Results  Component Value Date   LDLCALC 41 07/21/2008             

## 2011-01-10 NOTE — Progress Notes (Signed)
Austin Gallagher is seen today for f/U of CAD. He had a stent to the RCA in 2005. He has had both knees replaced the left most recently last year by Dr. Despina Hick. He is active walking his dog Beauregard. He is not having SSCP, palpitations, dyspnea or syncope His recent LDL is 49 with normal LFT's His BP meds have been changed by Dr. Ronne Binning He thinks his Cozaar is making him itchy and wonders if his memory is worse on statin Rx BP is hight today and needs to be monitored at home  Had a gun go off in his hand and is having hearing problems   ROS: Denies fever, malais, weight loss, blurry vision, decreased visual acuity, cough, sputum, SOB, hemoptysis, pleuritic pain, palpitaitons, heartburn, abdominal pain, melena, lower extremity edema, claudication, or rash.  All other systems reviewed and negative  General: Affect appropriate Healthy:  appears stated age HEENT: normal Neck supple with no adenopathy JVP normal no bruits no thyromegaly Lungs clear with no wheezing and good diaphragmatic motion Heart:  S1/S2 no murmur,rub, gallop or click PMI normal Abdomen: benighn, BS positve, no tenderness, no AAA no bruit.  No HSM or HJR Distal pulses intact with no bruits No edema Neuro non-focal Skin warm and dry No muscular weakness   Current Outpatient Prescriptions  Medication Sig Dispense Refill  . aspirin 81 MG tablet Take 81 mg by mouth daily.        . metoprolol succinate (TOPROL-XL) 25 MG 24 hr tablet Take 0.5 tablets (12.5 mg total) by mouth daily.  30 tablet  1  . ramipril (ALTACE) 10 MG tablet Take 10 mg by mouth daily.          Allergies  Amlodipine  Electrocardiogram:  12/08/10 NSR 61 normal ECG  Assessment and Plan

## 2011-01-10 NOTE — Assessment & Plan Note (Signed)
Home readings ok.  ? Cough with ramipril.  Cozaar D/C in past for itching and memory issues.  Try Diovan 320 and continue low dose Toprol 12.5 daily

## 2011-01-10 NOTE — Assessment & Plan Note (Signed)
Stable with no angina and good activity level.  Continue medical Rx  

## 2011-01-12 ENCOUNTER — Other Ambulatory Visit: Payer: Self-pay | Admitting: *Deleted

## 2011-01-12 MED ORDER — METOPROLOL SUCCINATE ER 25 MG PO TB24: 12.5000 mg | ORAL_TABLET | Freq: Every day | ORAL | Status: DC

## 2011-01-20 LAB — URINALYSIS, ROUTINE W REFLEX MICROSCOPIC
Glucose, UA: NEGATIVE
Hgb urine dipstick: NEGATIVE
Specific Gravity, Urine: 1.015 (ref 1.005–1.035)

## 2011-01-20 LAB — BASIC METABOLIC PANEL
CO2: 26
Chloride: 97
Creatinine, Ser: 0.95
GFR calc Af Amer: >60
Glucose, Bld: 98
Sodium: 133 — ABNORMAL LOW

## 2011-01-20 LAB — CBC
Hemoglobin: 12.3 — ABNORMAL LOW
MCHC: 34.7
MCV: 98.6
RBC: 3.6 — ABNORMAL LOW

## 2011-02-08 ENCOUNTER — Telehealth: Payer: Self-pay | Admitting: Cardiovascular Disease

## 2011-02-08 MED ORDER — VALSARTAN 320 MG PO TABS: 320.0000 mg | ORAL_TABLET | Freq: Every day | ORAL | Status: DC

## 2011-02-08 NOTE — Telephone Encounter (Signed)
Pt as given samples of diovan 320mg  and now out and wants refill called into [facility] [address], asap pls

## 2011-03-03 ENCOUNTER — Encounter: Payer: Self-pay | Admitting: Cardiovascular Disease

## 2011-04-11 ENCOUNTER — Other Ambulatory Visit: Payer: Self-pay | Admitting: Cardiovascular Disease

## 2011-04-11 MED ORDER — VALSARTAN 320 MG PO TABS: 320.0000 mg | ORAL_TABLET | Freq: Every day | ORAL | Status: DC

## 2011-04-13 ENCOUNTER — Other Ambulatory Visit: Payer: Self-pay | Admitting: Cardiovascular Disease

## 2011-04-13 MED ORDER — VALSARTAN 320 MG PO TABS: 320.0000 mg | ORAL_TABLET | Freq: Every day | ORAL | Status: DC

## 2011-05-10 ENCOUNTER — Ambulatory Visit: Payer: Medicare Other

## 2011-05-10 ENCOUNTER — Ambulatory Visit (INDEPENDENT_AMBULATORY_CARE_PROVIDER_SITE_OTHER): Payer: Medicare Other | Admitting: Family Medicine

## 2011-05-10 DIAGNOSIS — T148XXA Other injury of unspecified body region, initial encounter: Secondary | ICD-10-CM

## 2011-05-10 DIAGNOSIS — M549 Dorsalgia, unspecified: Secondary | ICD-10-CM | POA: Diagnosis not present

## 2011-05-10 DIAGNOSIS — Y92009 Unspecified place in unspecified non-institutional (private) residence as the place of occurrence of the external cause: Secondary | ICD-10-CM | POA: Diagnosis not present

## 2011-05-10 DIAGNOSIS — IMO0002 Reserved for concepts with insufficient information to code with codable children: Secondary | ICD-10-CM

## 2011-05-10 DIAGNOSIS — W19XXXA Unspecified fall, initial encounter: Secondary | ICD-10-CM | POA: Diagnosis not present

## 2011-05-10 MED ORDER — MUPIROCIN 2 % EX OINT: TOPICAL_OINTMENT | Freq: Two times a day (BID) | CUTANEOUS | Status: AC

## 2011-05-10 MED ORDER — TRAMADOL HCL 50 MG PO TABS: 50.0000 mg | ORAL_TABLET | Freq: Three times a day (TID) | ORAL | Status: AC | PRN

## 2011-05-10 NOTE — Progress Notes (Signed)
 [facility]:  Office Visit  Chief Complaint:  Chief Complaint  Patient presents with  . Back Pain    5 days ago fell in bathroom and hit back.    HPI: Austin Gallagher is a 78 y.o. male who complains of  Mid back pain s/p fall 5 days ago at night in bathroom at home. Denies hitting head or LOC. Back pain worsened last night. Increased with ROM. He denies osteopenia. He denies prior back trauma. He slipped and did not have any HA, dizziness, confusion, chestpain, palpitations or SOB when this all happened. He states that he simply slipped on something in bathroom fell and was able to break his fall by hitting his right elbow and back against a cabinet. Patient did jot have immediate pain but started having pain last night, unable to sleep well. He is only on aspirin and no there blood thinners.   Past Medical History  Diagnosis Date  . CAD (coronary artery disease)     Stent to RCA in 2005  . Hyperlipidemia    Past Surgical History  Procedure Date  . Replacement total knee bilateral   . Inguinal hernia repair    History   Social History  . Marital Status: Married    Spouse Name: N/A    Number of Children: 1  . Years of Education: N/A   Social History Main Topics  . Smoking status: Never Smoker   . Smokeless tobacco: None  . Alcohol Use: No  . Drug Use: None  . Sexually Active: None   Other Topics Concern  . None   Social History Narrative  . None   No family history on file. Allergies  Allergen Reactions  . Amlodipine     REACTION: ankles itching   Prior to Admission medications   Medication Sig Start Date End Date Taking? Authorizing Provider  aspirin 81 MG tablet Take 81 mg by mouth daily.     Yes Historical Provider, MD  metoprolol succinate (TOPROL-XL) 25 MG 24 hr tablet Take 0.5 tablets (12.5 mg total) by mouth daily. 01/12/11  Yes Wendall Stade, MD  valsartan (DIOVAN) 320 MG tablet Take 1 tablet (320 mg total) by mouth daily. 04/13/11  Yes  Wendall Stade, MD     ROS: The patient denies confusion, LOC, AMS, fevers, chills, night sweats, unintentional weight loss, chest pain, palpitations, wheezing, dyspnea on exertion, nausea, vomiting, abdominal pain, dysuria, hematuria, melena, numbness, weakness, or tingling.   + back pain, wound on back where fell  All other systems have been reviewed and were otherwise negative with the exception of those mentioned in the HPI and as above.    PHYSICAL EXAM: Filed Vitals:   05/10/11 1552  BP: 182/66  Pulse: 60  Temp: 98 F (36.7 C)  Resp: 14   Filed Vitals:   05/10/11 1552  Height: 5' 5.75" (1.67 m)  Weight: 137 lb (62.143 kg)   Body mass index is 22.28 kg/(m^2).  General: Alert, no acute distress HEENT:  Normocephalic, atraumatic, oropharynx patent. Cardiovascular:  Regular rate and rhythm, no rubs murmurs or gallops.  No Carotid bruits, radial pulse intact. No pedal edema.  Respiratory: Clear to auscultation bilaterally.  No wheezes, rales, or rhonchi.  No cyanosis, no use of accessory musculature GI: No organomegaly, abdomen is soft and non-tender, positive bowel sounds.  No masses. Skin: + wound from skin tear s/p fall, + ecchymosis Neurologic: Facial musculature symmetric.ROM intact in thoracic and lumbar spine.  NO gross neuor deficits appreciated Psychiatric: Patient is appropriate throughout our interaction. Lymphatic: No axillary or cervical lymphadenopathy Musculoskeletal: Gait intact.+ tenderness at T10-T12    EKG/XRAY:   Primary read interpreted by Dr. Conley Rolls at [facility].Thoracic and LUmbar spine-no appreciable acute compression fx  However hard to interpret thoracic spine due to DJD   ASSESSMENT/PLAN: 1. Mid back pain secondary to fall-most likely contusion , no fx or dislocation. Rx Tramadol 2. Skin Tear on back and right elbow-Wound care BID with soap and water, Bactroban and dry dressing BID.   F/u prn     ,  PHUONG, DO 05/10/2011 4:18 PM

## 2011-05-19 DIAGNOSIS — Z125 Encounter for screening for malignant neoplasm of prostate: Secondary | ICD-10-CM | POA: Diagnosis not present

## 2011-05-19 DIAGNOSIS — Z0000 Encounter for general adult medical examination without abnormal findings: Secondary | ICD-10-CM | POA: Diagnosis not present

## 2011-05-19 DIAGNOSIS — E785 Hyperlipidemia, unspecified: Secondary | ICD-10-CM | POA: Diagnosis not present

## 2011-05-19 DIAGNOSIS — I10 Essential (primary) hypertension: Secondary | ICD-10-CM | POA: Diagnosis not present

## 2011-05-19 DIAGNOSIS — I2510 Atherosclerotic heart disease of native coronary artery without angina pectoris: Secondary | ICD-10-CM | POA: Diagnosis not present

## 2011-05-26 DIAGNOSIS — E785 Hyperlipidemia, unspecified: Secondary | ICD-10-CM | POA: Diagnosis not present

## 2011-05-26 DIAGNOSIS — M810 Age-related osteoporosis without current pathological fracture: Secondary | ICD-10-CM | POA: Diagnosis not present

## 2011-05-26 DIAGNOSIS — I10 Essential (primary) hypertension: Secondary | ICD-10-CM | POA: Diagnosis not present

## 2011-05-26 DIAGNOSIS — I2510 Atherosclerotic heart disease of native coronary artery without angina pectoris: Secondary | ICD-10-CM | POA: Diagnosis not present

## 2011-08-01 DIAGNOSIS — C44320 Squamous cell carcinoma of skin of unspecified parts of face: Secondary | ICD-10-CM | POA: Diagnosis not present

## 2011-08-01 DIAGNOSIS — L570 Actinic keratosis: Secondary | ICD-10-CM | POA: Diagnosis not present

## 2011-08-01 DIAGNOSIS — C44319 Basal cell carcinoma of skin of other parts of face: Secondary | ICD-10-CM | POA: Diagnosis not present

## 2011-08-01 DIAGNOSIS — Z85828 Personal history of other malignant neoplasm of skin: Secondary | ICD-10-CM | POA: Diagnosis not present

## 2011-08-01 DIAGNOSIS — L821 Other seborrheic keratosis: Secondary | ICD-10-CM | POA: Diagnosis not present

## 2011-08-03 ENCOUNTER — Encounter: Payer: Self-pay | Admitting: *Deleted

## 2011-08-04 ENCOUNTER — Ambulatory Visit (INDEPENDENT_AMBULATORY_CARE_PROVIDER_SITE_OTHER): Payer: Medicare Other | Admitting: Cardiovascular Disease

## 2011-08-04 ENCOUNTER — Encounter: Payer: Self-pay | Admitting: Cardiovascular Disease

## 2011-08-04 VITALS — BP 149/66 | HR 69 | Wt 141.0 lb

## 2011-08-04 DIAGNOSIS — E782 Mixed hyperlipidemia: Secondary | ICD-10-CM | POA: Diagnosis not present

## 2011-08-04 DIAGNOSIS — F0390 Unspecified dementia without behavioral disturbance: Secondary | ICD-10-CM

## 2011-08-04 DIAGNOSIS — I10 Essential (primary) hypertension: Secondary | ICD-10-CM | POA: Diagnosis not present

## 2011-08-04 DIAGNOSIS — I2510 Atherosclerotic heart disease of native coronary artery without angina pectoris: Secondary | ICD-10-CM | POA: Diagnosis not present

## 2011-08-04 NOTE — Progress Notes (Signed)
Patient ID: Austin Gallagher, male   DOB: 29-Apr-1925, 78 y.o.   MRN: [id number] Steele is seen today for f/U of CAD. He had a stent to the RCA in 2005. He has had both knees replaced the left most 2011 by Dr. Despina Hick. He is active walking his dog Beauregard. He is not having SSCP, palpitations, dyspnea or syncope His recent LDL is 49 with normal LFT's His BP meds have been changed by Dr. Ronne Binning He thinks his Cozaar is making him itchy and wonders if his memory is worse on statin Rx Primary started diovan hctz  Had a gun go off in his hand and is having hearing problems  ROS: Denies fever, malais, weight loss, blurry vision, decreased visual acuity, cough, sputum, SOB, hemoptysis, pleuritic pain, palpitaitons, heartburn, abdominal pain, melena, lower extremity edema, claudication, or rash.  All other systems reviewed and negative  General: Affect appropriate Thin elderly male HEENT: poor hearing bilateral hearing aids Neck supple with no adenopathy JVP normal no bruits no thyromegaly Lungs clear with no wheezing and good diaphragmatic motion Heart:  S1/S2 no murmur, no rub, gallop or click PMI normal Abdomen: benighn, BS positve, no tenderness, no AAA no bruit.  No HSM or HJR Distal pulses intact with no bruits No edema Neuro non-focal Skin warm and dry No muscular weakness   Current Outpatient Prescriptions  Medication Sig Dispense Refill  . alendronate (FOSAMAX) 70 MG tablet Take 1 tablet by mouth Daily.      Marland Kitchen aspirin 81 MG tablet Take 81 mg by mouth daily.        . metoprolol succinate (TOPROL-XL) 25 MG 24 hr tablet Take 0.5 tablets (12.5 mg total) by mouth daily.  45 tablet  3  . valsartan (DIOVAN) 320 MG tablet Take 1 tablet (320 mg total) by mouth daily.  90 tablet  3  . valsartan-hydrochlorothiazide (DIOVAN-HCT) 320-12.5 MG per tablet Take 1 tablet by mouth Daily.        Allergies  Amlodipine  Electrocardiogram:  NSR rate 61  Done 12/18/10  Assessment and Plan

## 2011-08-04 NOTE — Patient Instructions (Signed)
Your physician wants you to follow-up in:  6 MONTHS WITH DR NISHAN  You will receive a reminder letter in the mail two months in advance. If you don't receive a letter, please call our office to schedule the follow-up appointment. Your physician recommends that you continue on your current medications as directed. Please refer to the Current Medication list given to you today. 

## 2011-08-04 NOTE — Assessment & Plan Note (Signed)
Well controlled.  Continue current medications and low sodium Dash type diet.    

## 2011-08-04 NOTE — Assessment & Plan Note (Signed)
Cholesterol is at goal.  Continue current dose of statin and diet Rx.  No myalgias or side effects.  F/U  LFT's in 6 months. Lab Results  Component Value Date   LDLCALC 41 07/21/2008             

## 2011-08-04 NOTE — Assessment & Plan Note (Signed)
Stable with no angina and good activity level.  Continue medical Rx  

## 2011-08-04 NOTE — Assessment & Plan Note (Signed)
No longer drinving car.  Told relative to talk to primary about namenda or aricept

## 2011-08-23 DIAGNOSIS — I2510 Atherosclerotic heart disease of native coronary artery without angina pectoris: Secondary | ICD-10-CM | POA: Diagnosis not present

## 2011-08-23 DIAGNOSIS — E785 Hyperlipidemia, unspecified: Secondary | ICD-10-CM | POA: Diagnosis not present

## 2011-08-23 DIAGNOSIS — R413 Other amnesia: Secondary | ICD-10-CM | POA: Diagnosis not present

## 2011-08-23 DIAGNOSIS — I10 Essential (primary) hypertension: Secondary | ICD-10-CM | POA: Diagnosis not present

## 2011-08-25 DIAGNOSIS — H903 Sensorineural hearing loss, bilateral: Secondary | ICD-10-CM | POA: Diagnosis not present

## 2011-08-30 DIAGNOSIS — C44319 Basal cell carcinoma of skin of other parts of face: Secondary | ICD-10-CM | POA: Diagnosis not present

## 2011-09-01 DIAGNOSIS — H6120 Impacted cerumen, unspecified ear: Secondary | ICD-10-CM | POA: Diagnosis not present

## 2011-09-14 DIAGNOSIS — R413 Other amnesia: Secondary | ICD-10-CM | POA: Diagnosis not present

## 2011-09-14 DIAGNOSIS — R269 Unspecified abnormalities of gait and mobility: Secondary | ICD-10-CM | POA: Diagnosis not present

## 2011-09-21 ENCOUNTER — Encounter: Payer: Medicare Other | Admitting: Physical Therapy

## 2011-09-21 ENCOUNTER — Ambulatory Visit: Payer: Medicare Other | Attending: Diagnostic Neuroimaging | Admitting: Physical Therapy

## 2011-09-21 DIAGNOSIS — IMO0001 Reserved for inherently not codable concepts without codable children: Secondary | ICD-10-CM | POA: Diagnosis not present

## 2011-09-21 DIAGNOSIS — R269 Unspecified abnormalities of gait and mobility: Secondary | ICD-10-CM | POA: Insufficient documentation

## 2011-09-23 ENCOUNTER — Ambulatory Visit: Payer: Medicare Other | Admitting: Physical Therapy

## 2011-09-28 ENCOUNTER — Other Ambulatory Visit: Payer: Self-pay | Admitting: Diagnostic Neuroimaging

## 2011-09-28 ENCOUNTER — Encounter: Payer: Medicare Other | Admitting: Physical Therapy

## 2011-09-28 ENCOUNTER — Ambulatory Visit
Admission: RE | Admit: 2011-09-28 | Discharge: 2011-09-28 | Disposition: A | Discharge status: Home / Self Care | Payer: Medicare Other | Source: Ambulatory Visit | Attending: Diagnostic Neuroimaging | Admitting: Diagnostic Neuroimaging

## 2011-09-28 DIAGNOSIS — R269 Unspecified abnormalities of gait and mobility: Secondary | ICD-10-CM

## 2011-09-28 DIAGNOSIS — R413 Other amnesia: Secondary | ICD-10-CM

## 2011-09-30 ENCOUNTER — Ambulatory Visit: Payer: Medicare Other | Admitting: Physical Therapy

## 2011-10-05 ENCOUNTER — Encounter: Payer: Medicare Other | Admitting: Physical Therapy

## 2011-10-05 ENCOUNTER — Ambulatory Visit: Payer: Medicare Other | Attending: Diagnostic Neuroimaging | Admitting: Physical Therapy

## 2011-10-05 DIAGNOSIS — IMO0001 Reserved for inherently not codable concepts without codable children: Secondary | ICD-10-CM | POA: Insufficient documentation

## 2011-10-05 DIAGNOSIS — R269 Unspecified abnormalities of gait and mobility: Secondary | ICD-10-CM | POA: Diagnosis not present

## 2011-10-10 ENCOUNTER — Ambulatory Visit: Payer: Medicare Other | Admitting: Physical Therapy

## 2011-10-12 ENCOUNTER — Ambulatory Visit: Payer: Medicare Other | Admitting: Physical Therapy

## 2011-10-19 ENCOUNTER — Ambulatory Visit: Payer: Medicare Other | Admitting: Physical Therapy

## 2011-10-21 ENCOUNTER — Encounter: Payer: Medicare Other | Admitting: Physical Therapy

## 2011-10-24 ENCOUNTER — Encounter: Payer: Medicare Other | Admitting: Physical Therapy

## 2011-11-23 DIAGNOSIS — M810 Age-related osteoporosis without current pathological fracture: Secondary | ICD-10-CM | POA: Diagnosis not present

## 2011-11-23 DIAGNOSIS — E785 Hyperlipidemia, unspecified: Secondary | ICD-10-CM | POA: Diagnosis not present

## 2011-11-23 DIAGNOSIS — I10 Essential (primary) hypertension: Secondary | ICD-10-CM | POA: Diagnosis not present

## 2011-11-29 DIAGNOSIS — I2510 Atherosclerotic heart disease of native coronary artery without angina pectoris: Secondary | ICD-10-CM | POA: Diagnosis not present

## 2011-11-29 DIAGNOSIS — E785 Hyperlipidemia, unspecified: Secondary | ICD-10-CM | POA: Diagnosis not present

## 2011-11-29 DIAGNOSIS — M810 Age-related osteoporosis without current pathological fracture: Secondary | ICD-10-CM | POA: Diagnosis not present

## 2011-11-29 DIAGNOSIS — I10 Essential (primary) hypertension: Secondary | ICD-10-CM | POA: Diagnosis not present

## 2011-12-12 DIAGNOSIS — F068 Other specified mental disorders due to known physiological condition: Secondary | ICD-10-CM | POA: Diagnosis not present

## 2011-12-15 DIAGNOSIS — M546 Pain in thoracic spine: Secondary | ICD-10-CM | POA: Diagnosis not present

## 2011-12-15 DIAGNOSIS — Z23 Encounter for immunization: Secondary | ICD-10-CM | POA: Diagnosis not present

## 2011-12-15 DIAGNOSIS — M47812 Spondylosis without myelopathy or radiculopathy, cervical region: Secondary | ICD-10-CM | POA: Diagnosis not present

## 2012-01-14 ENCOUNTER — Other Ambulatory Visit: Payer: Self-pay | Admitting: Cardiovascular Disease

## 2012-01-18 DIAGNOSIS — L570 Actinic keratosis: Secondary | ICD-10-CM | POA: Diagnosis not present

## 2012-02-13 DIAGNOSIS — F0280 Dementia in other diseases classified elsewhere without behavioral disturbance: Secondary | ICD-10-CM | POA: Diagnosis not present

## 2012-02-13 DIAGNOSIS — G309 Alzheimer's disease, unspecified: Secondary | ICD-10-CM | POA: Diagnosis not present

## 2012-02-21 DIAGNOSIS — H52209 Unspecified astigmatism, unspecified eye: Secondary | ICD-10-CM | POA: Diagnosis not present

## 2012-02-21 DIAGNOSIS — H5210 Myopia, unspecified eye: Secondary | ICD-10-CM | POA: Diagnosis not present

## 2012-02-21 DIAGNOSIS — H2510 Age-related nuclear cataract, unspecified eye: Secondary | ICD-10-CM | POA: Diagnosis not present

## 2012-04-30 ENCOUNTER — Telehealth: Payer: Self-pay | Admitting: Cardiovascular Disease

## 2012-04-30 MED ORDER — METOPROLOL SUCCINATE ER 25 MG PO TB24: ORAL_TABLET | ORAL | Status: DC

## 2012-04-30 NOTE — Telephone Encounter (Signed)
New Problem:    Patient's wife called in needing a prescription for his metoprolol succinate (TOPROL-XL) 25 MG 24 hr tablet sent to [facility] -[phone].  Please call back back once the order has been filled.

## 2012-05-10 ENCOUNTER — Encounter: Payer: Self-pay | Admitting: Internal Medicine

## 2012-05-10 ENCOUNTER — Ambulatory Visit (INDEPENDENT_AMBULATORY_CARE_PROVIDER_SITE_OTHER): Payer: Medicare Other | Admitting: Internal Medicine

## 2012-05-10 DIAGNOSIS — S0180XA Unspecified open wound of other part of head, initial encounter: Secondary | ICD-10-CM

## 2012-05-10 DIAGNOSIS — S0033XA Contusion of nose, initial encounter: Secondary | ICD-10-CM

## 2012-05-10 DIAGNOSIS — R6250 Unspecified lack of expected normal physiological development in childhood: Secondary | ICD-10-CM

## 2012-05-10 DIAGNOSIS — S0003XA Contusion of scalp, initial encounter: Secondary | ICD-10-CM | POA: Diagnosis not present

## 2012-05-10 DIAGNOSIS — S0083XA Contusion of other part of head, initial encounter: Secondary | ICD-10-CM

## 2012-05-10 DIAGNOSIS — S1093XA Contusion of unspecified part of neck, initial encounter: Secondary | ICD-10-CM | POA: Diagnosis not present

## 2012-05-10 MED ORDER — MUPIROCIN 2 % EX OINT: TOPICAL_OINTMENT | Freq: Three times a day (TID) | CUTANEOUS | Status: DC

## 2012-05-10 NOTE — Progress Notes (Signed)
Procedure: Consent obtained.  Local anesthesia with 1% lido with epi.  Wound explored and cleaned - Closed with 5-0 Ethilon #6 SI sutures. Good wound closure.  Wound care d/w pt and wife.  Clean with soap and water.  RTC 6-7 days for suture removal.  No ointments of creams.

## 2012-05-10 NOTE — Patient Instructions (Addendum)
Wound Care Wound care helps prevent pain and infection.  You may need a tetanus shot if:  You cannot remember when you had your last tetanus shot.  You have never had a tetanus shot.  The injury broke your skin. If you need a tetanus shot and you choose not to have one, you may get tetanus. Sickness from tetanus can be serious. HOME CARE   Only take medicine as told by your doctor.  Clean the wound daily with mild soap and water.  Change any bandages (dressings) as told by your doctor.  Put medicated cream and a bandage on the wound as told by your doctor.  Change the bandage if it gets wet, dirty, or starts to smell.  Take showers. Do not take baths, swim, or do anything that puts your wound under water.  Rest and raise (elevate) the wound until the pain and puffiness (swelling) are better.  Keep all doctor visits as told. GET HELP RIGHT AWAY IF:   Yellowish-white fluid (pus) comes from the wound.  Medicine does not lessen your pain.  There is a red streak going away from the wound.  You have a fever. MAKE SURE YOU:   Understand these instructions.  Will watch your condition.  Will get help right away if you are not doing well or get worse. Document Released: 12/29/2007 Document Revised: 06/13/2011 Document Reviewed: 07/25/2010 [facility] Patient Information 2013 [place], [place]. Fall Prevention and Home Safety Falls cause injuries and can affect all age groups. It is possible to use preventive measures to significantly decrease the likelihood of falls. There are many simple measures which can make your home safer and prevent falls. OUTDOORS  Repair cracks and edges of walkways and driveways.  Remove high doorway thresholds.  Trim shrubbery on the main path into your home.  Have good outside lighting.  Clear walkways of tools, rocks, debris, and clutter.  Check that handrails are not broken and are securely fastened. Both sides of steps should have  handrails.  Have leaves, snow, and ice cleared regularly.  Use sand or salt on walkways during winter months.  In the garage, clean up grease or oil spills. BATHROOM  Install night lights.  Install grab bars by the toilet and in the tub and shower.  Use non-skid mats or decals in the tub or shower.  Place a plastic non-slip stool in the shower to sit on, if needed.  Keep floors dry and clean up all water on the floor immediately.  Remove soap buildup in the tub or shower on a regular basis.  Secure bath mats with non-slip, double-sided rug tape.  Remove throw rugs and tripping hazards from the floors. BEDROOMS  Install night lights.  Make sure a bedside light is easy to reach.  Do not use oversized bedding.  Keep a telephone by your bedside.  Have a firm chair with side arms to use for getting dressed.  Remove throw rugs and tripping hazards from the floor. KITCHEN  Keep handles on pots and pans turned toward the center of the stove. Use back burners when possible.  Clean up spills quickly and allow time for drying.  Avoid walking on wet floors.  Avoid hot utensils and knives.  Position shelves so they are not too high or low.  Place commonly used objects within easy reach.  If necessary, use a sturdy step stool with a grab bar when reaching.  Keep electrical cables out of the way.  Do not use floor polish  or wax that makes floors slippery. If you must use wax, use non-skid floor wax.  Remove throw rugs and tripping hazards from the floor. STAIRWAYS  Never leave objects on stairs.  Place handrails on both sides of stairways and use them. Fix any loose handrails. Make sure handrails on both sides of the stairways are as long as the stairs.  Check carpeting to make sure it is firmly attached along stairs. Make repairs to worn or loose carpet promptly.  Avoid placing throw rugs at the top or bottom of stairways, or properly secure the rug with carpet  tape to prevent slippage. Get rid of throw rugs, if possible.  Have an electrician put in a light switch at the top and bottom of the stairs. OTHER FALL PREVENTION TIPS  Wear low-heel or rubber-soled shoes that are supportive and fit well. Wear closed toe shoes.  When using a stepladder, make sure it is fully opened and both spreaders are firmly locked. Do not climb a closed stepladder.  Add color or contrast paint or tape to grab bars and handrails in your home. Place contrasting color strips on first and last steps.  Learn and use mobility aids as needed. Install an electrical emergency response system.  Turn on lights to avoid dark areas. Replace light bulbs that burn out immediately. Get light switches that glow.  Arrange furniture to create clear pathways. Keep furniture in the same place.  Firmly attach carpet with non-skid or double-sided tape.  Eliminate uneven floor surfaces.  Select a carpet pattern that does not visually hide the edge of steps.  Be aware of all pets. OTHER HOME SAFETY TIPS  Set the water temperature for 120 F (48.8 C).  Keep emergency numbers on or near the telephone.  Keep smoke detectors on every level of the home and near sleeping areas. Document Released: 03/11/2002 Document Revised: 09/20/2011 Document Reviewed: 06/10/2011 [facility] Patient Information 2013 [place], [place].

## 2012-05-10 NOTE — Progress Notes (Signed)
  Subjective:    Patient ID: Austin Gallagher, male    DOB: [birth year], 79 y.o.   MRN: [id number]  HPI [name] walking the dog, has unsteady gait, arthritis, dementia. Wife, Austin Gallagher brought him in. Has wounds on face and nose and hand. No joint injurys, is alert for him, not on blood thinners except baby asa.   Review of Systems     Objective:   Physical Exam  Vitals reviewed. Constitutional: He appears well-developed and well-nourished.  HENT:  Right Ear: External ear normal.  Left Ear: External ear normal.  Mouth/Throat: Oropharynx is clear and moist.  Skin: Bruising, ecchymosis and laceration noted.      Nose wound needs repair All wounds cleaned normal saline Neuro exam intact, joints intact  Repaired by Ms. Weber    Assessment & Plan:  Contusion face and nose Wounds face and nose Pain face Ice/wound care/mupirocin/

## 2012-05-18 ENCOUNTER — Ambulatory Visit (INDEPENDENT_AMBULATORY_CARE_PROVIDER_SITE_OTHER): Payer: Medicare Other | Admitting: Internal Medicine

## 2012-05-18 VITALS — BP 167/67 | HR 77 | Temp 97.9°F | Resp 16

## 2012-05-18 DIAGNOSIS — S0180XA Unspecified open wound of other part of head, initial encounter: Secondary | ICD-10-CM | POA: Diagnosis not present

## 2012-05-18 DIAGNOSIS — S0033XA Contusion of nose, initial encounter: Secondary | ICD-10-CM

## 2012-05-18 DIAGNOSIS — S0003XA Contusion of scalp, initial encounter: Secondary | ICD-10-CM | POA: Diagnosis not present

## 2012-05-18 DIAGNOSIS — S0083XA Contusion of other part of head, initial encounter: Secondary | ICD-10-CM | POA: Diagnosis not present

## 2012-05-18 DIAGNOSIS — W19XXXA Unspecified fall, initial encounter: Secondary | ICD-10-CM

## 2012-05-18 NOTE — Progress Notes (Signed)
  Subjective:    Patient ID: Austin Gallagher, male    DOB: [birth year], 79 y.o.   MRN: [id number]  HPI [name] walking the dog 1 week ago. Chipped tooth, has seen dentist. No HAs or dizzyness. No other joint or bone pain. No neck pain, has full function all sxs except his problem list.   Review of Systems     Objective:   Physical Exam  Vitals reviewed. Constitutional: He appears well-developed and well-nourished.  Eyes: EOM are normal.  Neck: Normal range of motion. Neck supple.  Pulmonary/Chest: Effort normal.  Musculoskeletal: Normal range of motion. He exhibits no edema and no tenderness.  Neurological: He is alert. No cranial nerve deficit. He exhibits normal muscle tone. Coordination normal.  Psychiatric: He has a normal mood and affect. His behavior is normal.   Appears well Wounds face and nose healed no infection. Remove all sutures./dressing applied       Assessment & Plan:  Wound care

## 2012-05-18 NOTE — Progress Notes (Signed)
  Subjective:    Patient ID: Austin Gallagher, male    DOB: [birth year], 79 y.o.   MRN: [id number]  HPI Pt presents to clinic today for suture removal on the bridge of his nose. Pt has no complaints of aches and pains.   Review of Systems     Objective:   Physical Exam        Assessment & Plan:

## 2012-05-22 DIAGNOSIS — L821 Other seborrheic keratosis: Secondary | ICD-10-CM | POA: Diagnosis not present

## 2012-05-22 DIAGNOSIS — L570 Actinic keratosis: Secondary | ICD-10-CM | POA: Diagnosis not present

## 2012-05-22 DIAGNOSIS — Z85828 Personal history of other malignant neoplasm of skin: Secondary | ICD-10-CM | POA: Diagnosis not present

## 2012-05-29 DIAGNOSIS — E785 Hyperlipidemia, unspecified: Secondary | ICD-10-CM | POA: Diagnosis not present

## 2012-05-29 DIAGNOSIS — Z1331 Encounter for screening for depression: Secondary | ICD-10-CM | POA: Diagnosis not present

## 2012-05-29 DIAGNOSIS — Z125 Encounter for screening for malignant neoplasm of prostate: Secondary | ICD-10-CM | POA: Diagnosis not present

## 2012-05-29 DIAGNOSIS — I10 Essential (primary) hypertension: Secondary | ICD-10-CM | POA: Diagnosis not present

## 2012-05-29 DIAGNOSIS — M810 Age-related osteoporosis without current pathological fracture: Secondary | ICD-10-CM | POA: Diagnosis not present

## 2012-05-29 DIAGNOSIS — Z0000 Encounter for general adult medical examination without abnormal findings: Secondary | ICD-10-CM | POA: Diagnosis not present

## 2012-06-05 DIAGNOSIS — I2510 Atherosclerotic heart disease of native coronary artery without angina pectoris: Secondary | ICD-10-CM | POA: Diagnosis not present

## 2012-06-05 DIAGNOSIS — M810 Age-related osteoporosis without current pathological fracture: Secondary | ICD-10-CM | POA: Diagnosis not present

## 2012-06-05 DIAGNOSIS — I10 Essential (primary) hypertension: Secondary | ICD-10-CM | POA: Diagnosis not present

## 2012-06-05 DIAGNOSIS — E785 Hyperlipidemia, unspecified: Secondary | ICD-10-CM | POA: Diagnosis not present

## 2012-06-08 ENCOUNTER — Encounter (HOSPITAL_COMMUNITY): Payer: Self-pay | Admitting: Emergency Medicine

## 2012-06-08 ENCOUNTER — Inpatient Hospital Stay (HOSPITAL_COMMUNITY)
Admission: EM | Admit: 2012-06-08 | Discharge: 2012-06-11 | DRG: 101 | Disposition: A | Discharge status: Home Health | Payer: Medicare Other | Attending: Internal Medicine | Admitting: Internal Medicine

## 2012-06-08 ENCOUNTER — Emergency Department (HOSPITAL_COMMUNITY): Payer: Medicare Other

## 2012-06-08 ENCOUNTER — Inpatient Hospital Stay (HOSPITAL_COMMUNITY): Payer: Medicare Other

## 2012-06-08 DIAGNOSIS — I10 Essential (primary) hypertension: Secondary | ICD-10-CM

## 2012-06-08 DIAGNOSIS — R7989 Other specified abnormal findings of blood chemistry: Secondary | ICD-10-CM | POA: Diagnosis not present

## 2012-06-08 DIAGNOSIS — Z7982 Long term (current) use of aspirin: Secondary | ICD-10-CM

## 2012-06-08 DIAGNOSIS — Z9889 Other specified postprocedural states: Secondary | ICD-10-CM

## 2012-06-08 DIAGNOSIS — R569 Unspecified convulsions: Secondary | ICD-10-CM | POA: Diagnosis not present

## 2012-06-08 DIAGNOSIS — Z9861 Coronary angioplasty status: Secondary | ICD-10-CM | POA: Diagnosis not present

## 2012-06-08 DIAGNOSIS — R4701 Aphasia: Secondary | ICD-10-CM

## 2012-06-08 DIAGNOSIS — E782 Mixed hyperlipidemia: Secondary | ICD-10-CM

## 2012-06-08 DIAGNOSIS — J9819 Other pulmonary collapse: Secondary | ICD-10-CM | POA: Diagnosis not present

## 2012-06-08 DIAGNOSIS — F29 Unspecified psychosis not due to a substance or known physiological condition: Secondary | ICD-10-CM | POA: Diagnosis not present

## 2012-06-08 DIAGNOSIS — F0390 Unspecified dementia without behavioral disturbance: Secondary | ICD-10-CM | POA: Diagnosis not present

## 2012-06-08 DIAGNOSIS — E871 Hypo-osmolality and hyponatremia: Secondary | ICD-10-CM | POA: Diagnosis present

## 2012-06-08 DIAGNOSIS — I369 Nonrheumatic tricuspid valve disorder, unspecified: Secondary | ICD-10-CM

## 2012-06-08 DIAGNOSIS — Z96659 Presence of unspecified artificial knee joint: Secondary | ICD-10-CM

## 2012-06-08 DIAGNOSIS — I5189 Other ill-defined heart diseases: Secondary | ICD-10-CM | POA: Diagnosis present

## 2012-06-08 DIAGNOSIS — I639 Cerebral infarction, unspecified: Secondary | ICD-10-CM | POA: Insufficient documentation

## 2012-06-08 DIAGNOSIS — G40909 Epilepsy, unspecified, not intractable, without status epilepticus: Secondary | ICD-10-CM | POA: Diagnosis present

## 2012-06-08 DIAGNOSIS — I219 Acute myocardial infarction, unspecified: Secondary | ICD-10-CM | POA: Diagnosis not present

## 2012-06-08 DIAGNOSIS — I2510 Atherosclerotic heart disease of native coronary artery without angina pectoris: Secondary | ICD-10-CM

## 2012-06-08 DIAGNOSIS — I513 Intracardiac thrombosis, not elsewhere classified: Secondary | ICD-10-CM

## 2012-06-08 DIAGNOSIS — Z79899 Other long term (current) drug therapy: Secondary | ICD-10-CM | POA: Diagnosis not present

## 2012-06-08 DIAGNOSIS — R4182 Altered mental status, unspecified: Secondary | ICD-10-CM | POA: Diagnosis not present

## 2012-06-08 DIAGNOSIS — R000 Tachycardia, unspecified: Secondary | ICD-10-CM | POA: Diagnosis not present

## 2012-06-08 DIAGNOSIS — Z66 Do not resuscitate: Secondary | ICD-10-CM | POA: Diagnosis present

## 2012-06-08 HISTORY — DX: Unspecified dementia, unspecified severity, without behavioral disturbance, psychotic disturbance, mood disturbance, and anxiety: F03.90

## 2012-06-08 LAB — POCT I-STAT TROPONIN I: Troponin i, poc: 0.14 ng/mL (ref 0.00–0.08)

## 2012-06-08 LAB — URINE MICROSCOPIC-ADD ON

## 2012-06-08 LAB — CBC
HCT: 36.3 % — ABNORMAL LOW (ref 39.0–52.0)
Hemoglobin: 13 g/dL (ref 13.0–17.0)
MCH: 34.9 pg — ABNORMAL HIGH (ref 26.0–34.0)
MCHC: 36.1 g/dL — ABNORMAL HIGH (ref 30.0–36.0)
MCHC: 35.9 g/dL (ref 30.0–36.0)
MCV: 97.3 fL (ref 78.0–100.0)
RDW: 11.8 % (ref 11.5–15.5)

## 2012-06-08 LAB — URINALYSIS, ROUTINE W REFLEX MICROSCOPIC
Glucose, UA: 250 mg/dL — AB
Leukocytes, UA: NEGATIVE
Specific Gravity, Urine: 1.013 (ref 1.005–1.030)
pH: 6 (ref 5.0–8.0)

## 2012-06-08 LAB — DIFFERENTIAL
Basophils Absolute: 0 10*3/uL (ref 0.0–0.1)
Basophils Relative: 0 % (ref 0–1)
Monocytes Absolute: 0.9 10*3/uL (ref 0.1–1.0)
Neutro Abs: 10.6 10*3/uL — ABNORMAL HIGH (ref 1.7–7.7)

## 2012-06-08 LAB — PROTIME-INR
INR: 1.02 (ref 0.00–1.49)
Prothrombin Time: 13.3 seconds (ref 11.6–15.2)

## 2012-06-08 LAB — COMPREHENSIVE METABOLIC PANEL
AST: 26 U/L (ref 0–37)
Albumin: 3.9 g/dL (ref 3.5–5.2)
Calcium: 9.4 mg/dL (ref 8.4–10.5)
Chloride: 93 mEq/L — ABNORMAL LOW (ref 96–112)
Creatinine, Ser: 0.9 mg/dL (ref 0.50–1.35)
Total Bilirubin: 0.4 mg/dL (ref 0.3–1.2)

## 2012-06-08 LAB — TROPONIN I: Troponin I: 1.09 ng/mL (ref <0.30)

## 2012-06-08 LAB — LIPID PANEL
Cholesterol: 146 mg/dL (ref 0–200)
Triglycerides: 51 mg/dL (ref <150)
VLDL: 10 mg/dL (ref 0–40)

## 2012-06-08 LAB — GLUCOSE, CAPILLARY: Glucose-Capillary: 136 mg/dL — ABNORMAL HIGH (ref 70–99)

## 2012-06-08 LAB — POCT I-STAT, CHEM 8
Chloride: 96 mEq/L (ref 96–112)
HCT: 39 % (ref 39.0–52.0)
Potassium: 4.3 mEq/L (ref 3.5–5.1)

## 2012-06-08 LAB — CREATININE, SERUM: Creatinine, Ser: 0.81 mg/dL (ref 0.50–1.35)

## 2012-06-08 LAB — APTT: aPTT: 29 seconds (ref 24–37)

## 2012-06-08 MED ORDER — ASPIRIN 81 MG PO TABS: 81.0000 mg | ORAL_TABLET | Freq: Every day | ORAL | Status: DC

## 2012-06-08 MED ORDER — METOPROLOL SUCCINATE 12.5 MG HALF TABLET
12.5000 mg | ORAL_TABLET | Freq: Every day | ORAL | Status: DC
  Administered 2012-06-08 – 2012-06-11 (×3): 12.5 mg via ORAL
  Filled 2012-06-08 (×4): qty 1

## 2012-06-08 MED ORDER — ASPIRIN EC 81 MG PO TBEC
81.0000 mg | DELAYED_RELEASE_TABLET | Freq: Every day | ORAL | Status: DC
  Administered 2012-06-08 – 2012-06-11 (×3): 81 mg via ORAL
  Filled 2012-06-08 (×4): qty 1

## 2012-06-08 MED ORDER — ONDANSETRON HCL 4 MG PO TABS: 4.0000 mg | ORAL_TABLET | Freq: Four times a day (QID) | ORAL | Status: DC | PRN

## 2012-06-08 MED ORDER — SODIUM CHLORIDE 0.9 % IJ SOLN
3.0000 mL | Freq: Two times a day (BID) | INTRAMUSCULAR | Status: DC
  Administered 2012-06-08 – 2012-06-11 (×7): 3 mL via INTRAVENOUS

## 2012-06-08 MED ORDER — SODIUM CHLORIDE 0.9 % IV SOLN
500.0000 mg | Freq: Two times a day (BID) | INTRAVENOUS | Status: DC
  Filled 2012-06-08: qty 5

## 2012-06-08 MED ORDER — SODIUM CHLORIDE 0.9 % IV SOLN
500.0000 mg | Freq: Two times a day (BID) | INTRAVENOUS | Status: DC
  Administered 2012-06-08 – 2012-06-11 (×6): 500 mg via INTRAVENOUS
  Filled 2012-06-08 (×8): qty 5

## 2012-06-08 MED ORDER — DONEPEZIL HCL 10 MG PO TABS
10.0000 mg | ORAL_TABLET | Freq: Every evening | ORAL | Status: DC | PRN
  Administered 2012-06-08: 10 mg via ORAL
  Filled 2012-06-08 (×2): qty 1

## 2012-06-08 MED ORDER — ASPIRIN 300 MG RE SUPP
300.0000 mg | Freq: Every day | RECTAL | Status: DC
  Administered 2012-06-08: 300 mg via RECTAL
  Filled 2012-06-08 (×2): qty 1

## 2012-06-08 MED ORDER — SODIUM CHLORIDE 0.9 % IV SOLN: INTRAVENOUS | Status: DC

## 2012-06-08 MED ORDER — ENOXAPARIN SODIUM 30 MG/0.3ML ~~LOC~~ SOLN
30.0000 mg | SUBCUTANEOUS | Status: DC
  Administered 2012-06-08 – 2012-06-09 (×2): 30 mg via SUBCUTANEOUS
  Filled 2012-06-08 (×3): qty 0.3

## 2012-06-08 MED ORDER — ONDANSETRON HCL 4 MG/2ML IJ SOLN: 4.0000 mg | Freq: Four times a day (QID) | INTRAMUSCULAR | Status: DC | PRN

## 2012-06-08 MED ORDER — SODIUM CHLORIDE 0.9 % IV SOLN
500.0000 mg | INTRAVENOUS | Status: AC
  Administered 2012-06-08: 500 mg via INTRAVENOUS
  Filled 2012-06-08: qty 5

## 2012-06-08 NOTE — Evaluation (Signed)
Clinical/Bedside Swallow Evaluation Patient Details  Name: Austin Gallagher MRN: [id number] Date of Birth: [birth year]  Today's Date: 06/08/2012 Time: 2130-8657 SLP Time Calculation (min): 34 min  Past Medical History:  Past Medical History  Diagnosis Date  . CAD (coronary artery disease)     Stent to RCA in 2005  . Hyperlipidemia   . Essential hypertension, benign   . CORONARY ATHEROSCLEROSIS, NATIVE VESSEL   . Dementia    Past Surgical History:  Past Surgical History  Procedure Laterality Date  . Replacement total knee bilateral    . Inguinal hernia repair    . Coronary angioplasty with stent placement  20073   HPI:  79 year old male admitted today due to AMS, seizure activity and onset of receptive and expressive aphasia.   Assessment / Plan / Recommendation Clinical Impression  No overt s/s aspiration observed. Tongue is slightly swollen and bruised from where pt bit it this morning, but this does not affect speech or swallow function.  Per wife, pt on regular diet and thin liquids at home. Will begin soft diet with chopped meats for safety and energy conservation. ST to follow for education, diet tolerance and advancement.    Aspiration Risk  Mild    Diet Recommendation Dysphagia 3 (Mechanical Soft);Thin liquid (chop meats)   Liquid Administration via: Straw;Cup Medication Administration: Whole meds with liquid Supervision: Full supervision/cueing for compensatory strategies Compensations: Slow rate;Small sips/bites Postural Changes and/or Swallow Maneuvers: Seated upright 90 degrees;Upright 30-60 min after meal    Other  Recommendations Oral Care Recommendations: Oral care QID;Staff/trained caregiver to provide oral care   Follow Up Recommendations  24 hour supervision/assistance    Frequency and Duration min 1 x/week  1 week   Pertinent Vitals/Pain No pain indicated    SLP Swallow Goals Patient will consume recommended diet without observed clinical signs of  aspiration with: Supervision/safety Swallow Study Goal #1 - Progress: Progressing toward goal Patient will utilize recommended strategies during swallow to increase swallowing safety with: Supervision/safety Swallow Study Goal #2 - Progress: Progressing toward goal   Swallow Study Prior Functional Status   Pt tolerated regular diet with thin liquids prior to admit. No history of dysphagia.    General Date of Onset: 06/08/12 HPI: 79 year old male admitted today due to AMS, seizure activity and onset of receptive and expressive aphasia. Type of Study: Bedside swallow evaluation Diet Prior to this Study: NPO Temperature Spikes Noted: No Respiratory Status: Room air History of Recent Intubation: No Behavior/Cognition: Alert;Pleasant mood;Cooperative;Distractible;Requires cueing;Doesn't follow directions;Hard of hearing;Confused (dx dementia) Oral Cavity - Dentition: Adequate natural dentition Self-Feeding Abilities: Needs assist;Needs set up Patient Positioning: Upright in bed Baseline Vocal Quality: Clear Volitional Cough: Cognitively unable to elicit Volitional Swallow: Unable to elicit    Oral/Motor/Sensory Function Overall Oral Motor/Sensory Function:  (Tongue slightly swollen and bruised.)   Ice Chips Ice chips: Within functional limits Presentation: Spoon   Thin Liquid Thin Liquid: Within functional limits Presentation: Straw    Nectar Thick Nectar Thick Liquid: Not tested   Honey Thick Honey Thick Liquid: Not tested   Puree Puree: Within functional limits Presentation: Spoon   Solid   Celia B. Murvin [facility], CCC-SLP [phone] Solid: Within functional limits       Leigh Aurora 06/08/2012,3:27 PM

## 2012-06-08 NOTE — Progress Notes (Signed)
  Echocardiogram 2D Echocardiogram has been performed.  Austin Gallagher 06/08/2012, 4:58 PM

## 2012-06-08 NOTE — Consult Note (Signed)
NEURO HOSPITALIST CONSULT NOTE    Reason for Consult: code stroke  HPI:                                                                                                                                          Austin Gallagher is an 79 y.o. male who was last seen normal by wife at 8 AM.  She had went to let the dog out and then heard him moaning. When she turned around she found him leaned over moaning, drooling and shaking.  She laid him on the floor where he continued to shake.  EMS was called. When EMS arrived he was noted to have both receptive and expressive aphasia.  On arrival to ED patient continued to have difficulty with speech.  No acute infarct seen on CT head.  On examination patient was noted to have bitten tongue and it was divulged he had also urinated himself at some point.  Code stroke was canceled as patient likely suffered from seizure.   Past Medical History  Diagnosis Date  . CAD (coronary artery disease)     Stent to RCA in 2005  . Hyperlipidemia   . Essential hypertension, benign   . CORONARY ATHEROSCLEROSIS, NATIVE VESSEL     Past Surgical History  Procedure Laterality Date  . Replacement total knee bilateral    . Inguinal hernia repair        Family History: Mother HTN, CAD Father HTN  Social History:  reports that he has never smoked. He does not have any smokeless tobacco history on file. He reports that he does not drink alcohol. His drug history is not on file.  Allergies  Allergen Reactions  . Amlodipine     REACTION: ankles itching    MEDICATIONS:                                                                                                                     Current Facility-Administered Medications  Medication Dose Route Frequency Provider Last Rate Last Dose  . levETIRAcetam (KEPPRA) 500 mg in sodium chloride 0.9 % 100 mL IVPB  500 mg Intravenous NOW Ulice Dash, PA-C      . levETIRAcetam (KEPPRA) 500 mg in sodium  chloride 0.9 % 100 mL IVPB  500 mg Intravenous Q12H Ulice Dash, PA-C       Current Outpatient Prescriptions  Medication Sig Dispense Refill  . alendronate (FOSAMAX) 70 MG tablet Take 1 tablet by mouth Daily.      Marland Kitchen aspirin 81 MG tablet Take 81 mg by mouth daily.       Marland Kitchen donepezil (ARICEPT) 10 MG tablet Take 10 mg by mouth at bedtime as needed (Dementia).      . metoprolol succinate (TOPROL-XL) 25 MG 24 hr tablet TAKE 1/2 TABLET daily      . mupirocin ointment (BACTROBAN) 2 % Apply topically 3 (three) times daily.  22 g  0  . valsartan-hydrochlorothiazide (DIOVAN-HCT) 320-12.5 MG per tablet Take 1 tablet by mouth daily.          ROS:                                                                                                                                       History obtained from spouse  General ROS: negative for - chills, fatigue, fever, night sweats, weight gain or weight loss Psychological ROS: negative for - behavioral disorder, hallucinations, memory difficulties, mood swings or suicidal ideation Ophthalmic ROS: negative for - blurry vision, double vision, eye pain or loss of vision ENT ROS: negative for - epistaxis, nasal discharge, oral lesions, sore throat, tinnitus or vertigo Allergy and Immunology ROS: negative for - hives or itchy/watery eyes Hematological and Lymphatic ROS: negative for - bleeding problems, bruising or swollen lymph nodes Endocrine ROS: negative for - galactorrhea, hair pattern changes, polydipsia/polyuria or temperature intolerance Respiratory ROS: negative for - cough, hemoptysis, shortness of breath or wheezing Cardiovascular ROS: negative for - chest pain, dyspnea on exertion, edema or irregular heartbeat Gastrointestinal ROS: negative for - abdominal pain, diarrhea, hematemesis, nausea/vomiting or stool incontinence Genito-Urinary ROS: negative for - dysuria, hematuria, incontinence or urinary frequency/urgency Musculoskeletal ROS: negative for -  joint swelling or muscular weakness Neurological ROS: as noted in HPI Dermatological ROS: negative for rash and skin lesion changes   Blood pressure 127/68, pulse 74, temperature 97.5 F (36.4 C), temperature source Oral, resp. rate 17, SpO2 99.00%.   Neurologic Examination:                                                                                                      Mental Status: Alert,  Speech shows both evidence of expressive and receptive aphasia.  Able to intermittently mimic visual commands.  Cranial Nerves: II: Discs  flat bilaterally; Visual fields grossly normal -blinks to threat, pupils equal, round, reactive to light and accommodation III,IV, VI: ptosis not present, extra-ocular motions intact bilaterally V,VII: smile symmetric, facial light touch sensation normal bilaterally VIII: hearing normal bilaterally IX,X: gag reflex present XI: bilateral shoulder shrug XII: midline tongue extension Motor: Right : Upper extremity   5/5    Left:     Upper extremity   5/5  Lower extremity   5/5     Lower extremity   5/5 Tone and bulk:normal tone throughout; no atrophy noted Sensory: Pinprick and light touch intact throughout, bilaterally Deep Tendon Reflexes: 2+ and symmetric throughout bilateral UE, and KJ. No AJ Plantars: Right: downgoing   Left: downgoing Cerebellar: normal finger-to-nose no dysmetria.  H-S unable to assess due to language barrier.  CV: pulses palpable throughout    Lab Results  Component Value Date/Time   CHOL 118 07/21/2008  9:10 AM    Results for orders placed during the hospital encounter of 06/08/12 (from the past 48 hour(s))  GLUCOSE, CAPILLARY     Status: Abnormal   Collection Time    06/08/12 10:23 AM      Result Value Range   Glucose-Capillary 136 (*) 70 - 99 mg/dL  POCT I-STAT TROPONIN I     Status: Abnormal   Collection Time    06/08/12 10:35 AM      Result Value Range   Troponin i, poc 0.14 (*) 0.00 - 0.08 ng/mL   Comment NOTIFIED  PHYSICIAN     Comment 3            Comment: Due to the release kinetics of cTnI,     a negative result within the first hours     of the onset of symptoms does not rule out     myocardial infarction with certainty.     If myocardial infarction is still suspected,     repeat the test at appropriate intervals.  POCT I-STAT, CHEM 8     Status: Abnormal   Collection Time    06/08/12 10:37 AM      Result Value Range   Sodium 130 (*) 135 - 145 mEq/L   Potassium 4.3  3.5 - 5.1 mEq/L   Chloride 96  96 - 112 mEq/L   BUN 16  6 - 23 mg/dL   Creatinine, Ser 1.30  0.50 - 1.35 mg/dL   Glucose, Bld 865 (*) 70 - 99 mg/dL   Calcium, Ion 7.84  6.96 - 1.30 mmol/L   TCO2 26  0 - 100 mmol/L   Hemoglobin 13.3  13.0 - 17.0 g/dL   HCT 29.5  28.4 - 13.2 %    No results found.   Assessment/Plan: 79 YO male who was found with expressive and receptive aphasia after a period of jerking and drooling observed from wife.  With patients age, history of dementia and description of event that lead to patient being brought to ED,  episode likely represents seizure activity.   Recommend: 1) MRI brain to evaluate for seizure focus and possible CVA 2) EEG 3) Will load with 500 mg Keppra IV X1 and then follow with 500 mg IV Keppra BID.  4) seizure precautions 5) continue ASA 81 mg daily for now.   Assessment and plan discussed with with attending physician and they are in agreement.    Felicie Morn PA-C [facility] [phone]  Patient was personally including examination and formulation of above clinical assessment and recommendations.  CR Roseanne Reno  M.D. [facility] [phone]  06/08/2012, 10:53 AM

## 2012-06-08 NOTE — Consult Note (Signed)
PCP: Rene Paci, MD Cardiologist: PN  HPI:  Austin Gallagher is a 79 y.o. male with a history of CAD. He also has a history of dementia. He has been in his usual state of health lately. Today his wife noted he was having seizure activity and not speaking or moving normally. He was brought to the ER and a code stroke was called. He was seen by neuro who is following him. Cardiology was asked to see him because of an elevated Troponin.   Mr Fults does not answer questions well. His answers are not always responsive to the questions but he did give his wife's correct name and denied chest pain or SOB. No further information is available from the patient and he was not having ischemic symptoms per his wife.     Review of Systems:     Cardiac Review of Systems: {Y] = yes [ ]  = no  Chest Pain [    ]  Resting SOB [   ] Exertional SOB  [  ]  Orthopnea [  ]   Pedal Edema [   ]    Palpitations [  ] Syncope  [  ]   Presyncope [   ]  General Review of Systems: [Y] = yes [  ]=no Constitional: recent weight change [  ]; anorexia [  ]; fatigue [  ]; nausea [  ]; night sweats [  ]; fever [  ]; or chills [  ];                                                                                                                                          Dental: poor dentition[ y ];    Eye : blurred vision [  ]; diplopia [   ]; vision changes [  ];  Amaurosis fugax[  ]; Resp: cough [  ];  wheezing[  ];  hemoptysis[  ]; shortness of breath[  ]; paroxysmal nocturnal dyspnea[  ]; dyspnea on exertion[  ]; or orthopnea[  ];  GI:  gallstones[  ], vomiting[  ];  dysphagia[  ]; melena[  ];  hematochezia [  ]; heartburn[  ];   Hx of  Colonoscopy[  ]; GU: kidney stones [  ]; hematuria[  ];   dysuria [  ];  nocturia[  ];  history of     obstruction [  ];                 Skin: rash, swelling[  ];, hair loss[  ];  peripheral edema[  ];  or itching[  ]; Musculosketetal: myalgias[  ];  joint swelling[  ];  joint erythema[   ];  joint pain[  ];  back pain[  ];  Heme/Lymph: bruising[  ];  bleeding[  ];  anemia[  ];  Neuro: TIA[  ];  headaches[  ];  stroke[ ?y ];  vertigo[  ];  Seizures[?y  ];   paresthesias[  ];  difficulty walking[  ];  Psych:depression[  ]; anxiety[  ];  Endocrine: diabetes[  ];  thyroid dysfunction[  ];  Immunizations: Flu [  ]; Pneumococcal[  ];  Other:  Past Medical History  Diagnosis Date  . CAD (coronary artery disease)     Stent to RCA in 2005  . Hyperlipidemia   . Essential hypertension, benign   . CORONARY ATHEROSCLEROSIS, NATIVE VESSEL   . Dementia    Past Surgical History  Procedure Laterality Date  . Replacement total knee bilateral    . Inguinal hernia repair    . Coronary angioplasty with stent placement  2005   Prior to Admission medications   Medication Sig Start Date End Date Taking? Authorizing Provider  alendronate (FOSAMAX) 70 MG tablet Take 1 tablet by mouth Daily. 06/16/11  Yes Historical Provider, MD  aspirin 81 MG tablet Take 81 mg by mouth daily.    Yes Historical Provider, MD  donepezil (ARICEPT) 10 MG tablet Take 10 mg by mouth at bedtime as needed (Dementia).   Yes Historical Provider, MD  metoprolol succinate (TOPROL-XL) 25 MG 24 hr tablet TAKE 1/2 TABLET daily 04/30/12  Yes Wendall Stade, MD  mupirocin ointment (BACTROBAN) 2 % Apply topically 3 (three) times daily. 05/10/12  Yes Jonita Albee, MD  valsartan-hydrochlorothiazide (DIOVAN-HCT) 320-12.5 MG per tablet Take 1 tablet by mouth daily.   Yes Historical Provider, MD    Allergies  Allergen Reactions  . Amlodipine     REACTION: ankles itching    History   Social History  . Marital Status: Married    Spouse Name: N/A    Number of Children: 1  . Years of Education: N/A   Occupational History  . Retired [employer]    Social History Main Topics  . Smoking status: Never Smoker   . Smokeless tobacco: Not on file  . Alcohol Use: No  . Drug Use: Not on file  . Sexually Active: Not on file   Other  Topics Concern  . Not on file   Social History Narrative   He has no history of coronary artery disease in his siblings.  Lives with his wife who is his primary caregiver.    Family Status  Relation Status Death Age  . Mother Deceased 79    CVA  . Father Deceased 43    PNA    PHYSICAL EXAM: Filed Vitals:   06/08/12 1222  BP: 131/68  Pulse:   Temp:   Resp: 18   General:  Well appearing elderly white male. No respiratory difficulty, no acute distress HEENT: normal Neck: supple. no JVD. Carotids 2+ bilat; no bruits. No lymphadenopathy or thryomegaly appreciated. Cor: PMI nondisplaced. Regular rate & rhythm. No rubs, gallops or murmurs. Lungs: clear to auscultation bilaterally Abdomen: soft, nontender, nondistended. No hepatosplenomegaly. No bruits or masses. Good bowel sounds. Extremities: no cyanosis, clubbing, rash, edema Neuro: alert & oriented x 1, responds to his name.  Has expressive and receptive aphasia. He moves all 4 extremities w/o difficulty. Affect pleasant but not oriented.  ECG: 08-Jun-2012 09:49:29  SINUS RHYTHM ~ normal P axis, V-rate 50- 99 EARLY PRECORDIAL R/S TRANSITION ~ QRS area positive in V2 Standard 12 Lead Report ~ Unconfirmed Interpretation Otherwise Normal ECG 51mm/s 52mm/mV 150Hz  8.0.1 12SL 235 CID: [id number] Referred by: Unconfirmed Vent. rate 75 BPM PR interval 172 ms QRS duration 86 ms QT/QTc 396/442 ms  P-R-T axes 41 -1 54  Results for orders placed during the hospital encounter of 06/08/12 (from the past 24 hour(s))  PROTIME-INR     Status: None   Collection Time    06/08/12 10:23 AM      Result Value Range   Prothrombin Time 13.3  11.6 - 15.2 seconds   INR 1.02  0.00 - 1.49  APTT     Status: None   Collection Time    06/08/12 10:23 AM      Result Value Range   aPTT 29  24 - 37 seconds  CBC     Status: Abnormal   Collection Time    06/08/12 10:23 AM      Result Value Range   WBC 13.0 (*) 4.0 - 10.5 K/uL   RBC 3.72 (*) 4.22 - 5.81  MIL/uL   Hemoglobin 13.1  13.0 - 17.0 g/dL   HCT 57.8 (*) 46.9 - 62.9 %   MCV 97.6  78.0 - 100.0 fL   MCH 35.2 (*) 26.0 - 34.0 pg   MCHC 36.1 (*) 30.0 - 36.0 g/dL   RDW 52.8  41.3 - 24.4 %   Platelets 242  150 - 400 K/uL  DIFFERENTIAL     Status: Abnormal   Collection Time    06/08/12 10:23 AM      Result Value Range   Neutrophils Relative 82 (*) 43 - 77 %   Neutro Abs 10.6 (*) 1.7 - 7.7 K/uL   Lymphocytes Relative 9 (*) 12 - 46 %   Lymphs Abs 1.2  0.7 - 4.0 K/uL   Monocytes Relative 7  3 - 12 %   Monocytes Absolute 0.9  0.1 - 1.0 K/uL   Eosinophils Relative 2  0 - 5 %   Eosinophils Absolute 0.2  0.0 - 0.7 K/uL   Basophils Relative 0  0 - 1 %   Basophils Absolute 0.0  0.0 - 0.1 K/uL  COMPREHENSIVE METABOLIC PANEL     Status: Abnormal   Collection Time    06/08/12 10:23 AM      Result Value Range   Sodium 129 (*) 135 - 145 mEq/L   Potassium 4.3  3.5 - 5.1 mEq/L   Chloride 93 (*) 96 - 112 mEq/L   CO2 25  19 - 32 mEq/L   Glucose, Bld 127 (*) 70 - 99 mg/dL   BUN 15  6 - 23 mg/dL   Creatinine, Ser 0.10  0.50 - 1.35 mg/dL   Calcium 9.4  8.4 - 27.2 mg/dL   Total Protein 6.8  6.0 - 8.3 g/dL   Albumin 3.9  3.5 - 5.2 g/dL   AST 26  0 - 37 U/L   ALT 19  0 - 53 U/L   Alkaline Phosphatase 44  39 - 117 U/L   Total Bilirubin 0.4  0.3 - 1.2 mg/dL   GFR calc non Af Amer 75 (*) >90 mL/min   GFR calc Af Amer 87 (*) >90 mL/min  GLUCOSE, CAPILLARY     Status: Abnormal   Collection Time    06/08/12 10:23 AM      Result Value Range   Glucose-Capillary 136 (*) 70 - 99 mg/dL  TROPONIN I     Status: None   Collection Time    06/08/12 10:24 AM      Result Value Range   Troponin I <0.30  <0.30 ng/mL  POCT I-STAT TROPONIN I     Status: Abnormal   Collection Time  06/08/12 10:35 AM      Result Value Range   Troponin i, poc 0.14 (*) 0.00 - 0.08 ng/mL   Comment NOTIFIED PHYSICIAN     Comment 3           POCT I-STAT, CHEM 8     Status: Abnormal   Collection Time    06/08/12 10:37 AM       Result Value Range   Sodium 130 (*) 135 - 145 mEq/L   Potassium 4.3  3.5 - 5.1 mEq/L   Chloride 96  96 - 112 mEq/L   BUN 16  6 - 23 mg/dL   Creatinine, Ser 1.61  0.50 - 1.35 mg/dL   Glucose, Bld 096 (*) 70 - 99 mg/dL   Calcium, Ion 0.45  4.09 - 1.30 mmol/L   TCO2 26  0 - 100 mmol/L   Hemoglobin 13.3  13.0 - 17.0 g/dL   HCT 81.1  91.4 - 78.2 %  URINALYSIS, ROUTINE W REFLEX MICROSCOPIC     Status: Abnormal   Collection Time    06/08/12 10:47 AM      Result Value Range   Color, Urine YELLOW  YELLOW   APPearance HAZY (*) CLEAR   Specific Gravity, Urine 1.013  1.005 - 1.030   pH 6.0  5.0 - 8.0   Glucose, UA 250 (*) NEGATIVE mg/dL   Hgb urine dipstick MODERATE (*) NEGATIVE   Bilirubin Urine NEGATIVE  NEGATIVE   Ketones, ur NEGATIVE  NEGATIVE mg/dL   Protein, ur 956 (*) NEGATIVE mg/dL   Urobilinogen, UA 0.2  0.0 - 1.0 mg/dL   Nitrite NEGATIVE  NEGATIVE   Leukocytes, UA NEGATIVE  NEGATIVE  URINE MICROSCOPIC-ADD ON     Status: None   Collection Time    06/08/12 10:47 AM      Result Value Range   Squamous Epithelial / LPF RARE  RARE   WBC, UA 0-2  <3 WBC/hpf   RBC / HPF 3-6  <3 RBC/hpf   Bacteria, UA RARE  RARE   Urine-Other SPERM PRESENT    TROPONIN I     Status: Abnormal   Collection Time    06/08/12 12:06 PM      Result Value Range   Troponin I 1.09 (*) <0.30 ng/mL  LIPID PANEL     Status: None   Collection Time    06/08/12 12:06 PM      Result Value Range   Cholesterol 146  0 - 200 mg/dL   Triglycerides 51  <213 mg/dL   HDL 65  >08 mg/dL   Total CHOL/HDL Ratio 2.2     VLDL 10  0 - 40 mg/dL   LDL Cholesterol 71  0 - 99 mg/dL   Lab Results  Component Value Date   TROPONINI 1.09* 06/08/2012   Ct Head Wo Contrast  06/08/2012  *RADIOLOGY REPORT*  Clinical Data: Altered mental status  CT HEAD WITHOUT CONTRAST  Technique:  Contiguous axial images were obtained from the base of the skull through the vertex without contrast.  Comparison: 09/28/2011  Findings: Chronic ischemic  changes.  Global atrophy.  No mass effect, midline shift, or acute intracranial hemorrhage.  A right MCA branch within the right sylvian fissure is hyperdense on image 17.  Intravascular thrombus cannot be excluded.  IMPRESSION: Hyperdense right MCA branch as described.  Vessel thrombus cannot be excluded.  Chronic ischemic changes and global atrophy are noted. Critical Value/emergent results were called by telephone at the time of interpretation  on 06/08/2012 at 1050 hours to Dr. Rubin Payor, who verbally acknowledged these results.   Original Report Authenticated By: Jolaine Click, M.D.    Mr Brain Wo Contrast  06/08/2012  *RADIOLOGY REPORT*  Clinical Data: Acute onset of confusion.  Expressive aphasia.  MRI HEAD WITHOUT CONTRAST  Technique:  Multiplanar, multiecho pulse sequences of the brain and surrounding structures were obtained according to standard protocol without intravenous contrast.  Comparison: 06/08/2012 CT.  09/28/2011 MR.  Findings: Motion degraded exam.  No acute infarct.  No intracranial hemorrhage.  Moderate global atrophy.  Ventricular prominence may be related to atrophy although difficult to completely exclude mild component hydrocephalus.  Moderate small vessel disease type changes.  No intracranial mass lesion detected on this unenhanced exam.  Major intracranial vascular structures are patent.  Atherosclerotic type changes with narrowing left vertebral artery suspected.  Transverse ligament hypertrophy.  Prominent arachnoid granulations incidentally noted in the occipital bone.  IMPRESSION: No acute infarct.  Please see above.   Original Report Authenticated By: Lacy Duverney, M.D.    Dg Chest Port 1 View  06/08/2012  *RADIOLOGY REPORT*  Clinical Data: Altered mental status  PORTABLE CHEST - 1 VIEW  Comparison: 03/05/2008  Findings: Cardiomediastinal silhouette is stable.  There is poor inspiration.  No acute infiltrate or pulmonary edema.  Mild basilar atelectasis.  Degenerative changes  bilateral shoulders.  IMPRESSION: Poor inspiration.  No acute infiltrate or pulmonary edema.  Mild basilar atelectasis.   Original Report Authenticated By: Natasha Mead, M.D.      ASSESSMENT:  1.  Mixed hyperlipidemia  2.  Essential hypertension, benign  3.   CORONARY ATHEROSCLEROSIS, NATIVE VESSEL  4. Dementia  5. Seizure  6.  Stroke   7.  Elevated Troponin  PLAN/DISCUSSION:  Patient seen and examined with Theodore Demark, PA-C. We discussed all aspects of the encounter. I agree with the assessment as stated above. My thoughts below.  I suspect elevated troponin is related to neuro event and not primarily cardiac. That said, I cannot completely exclude demand ischemia or a precipitating arrhythmia. He is currently CP free and ECG is normal so I would not treat as NSTEMI.   Agree with ASA 81, monitoring on tele and checking echo. Can cycle enzymes. I doubt further cardiac work-up will be warranted. Even if cardiac enzymes trend up slightly, he would not be candidate for cath unless he developed symptoms and clinical evidence of ongoing ischemia.  Daniel Bensimhon,MD 3:01 PM

## 2012-06-08 NOTE — Progress Notes (Signed)
Utilization review completed.  P.J. Minnich,RN,BSN Case Manager [phone]  

## 2012-06-08 NOTE — ED Notes (Signed)
Results of troponin shown to Dr. Pickering 

## 2012-06-08 NOTE — ED Notes (Addendum)
Pt presents to ED via EMS with wife with complaints of altered mental status that started around 0730 this morning while eating breakfast. NAD. CBG 201 with EMS, PIV placed via EMS.

## 2012-06-08 NOTE — Progress Notes (Signed)
EEG completed.

## 2012-06-08 NOTE — H&P (Signed)
[facility] History and Physical  Austin Gallagher WUJ:[id number] DOB: 1926/02/22 DOA: 06/08/2012  Referring physician: Dr Rubin Payor. PCP: Rene Paci, MD  Specialists: Neurology, [facility].   Chief Complaint: AMS.  HPI: Austin Gallagher is a 79 y.o. male with PMH significant for CAD, Hyperlipidemia, HTN, mild dementia who was in his usual state of health until the morning of admission around 8 am, when wife notice AMS. Patient was sitting eating breakfast, wife heard a noise, he slump down, he was moaning, started to shake all over. He bite his tongue. There was mention of urinary incontinence. Patient was post ictal. He presented with expressive aphasia. Code stroke was call and subsequently cancer by neurology. History was obtain from wife. Patient unable to provide history due to aphasia.  Patient was also notice to have increase troponin. Cardiology was already consulted.   Review of Systems: Unable to obtain due to patient aphasia.   Past Medical History  Diagnosis Date  . CAD (coronary artery disease)     Stent to RCA in 2005  . Hyperlipidemia   . Essential hypertension, benign   . CORONARY ATHEROSCLEROSIS, NATIVE VESSEL    Past Surgical History  Procedure Laterality Date  . Replacement total knee bilateral    . Inguinal hernia repair     Social History: no prior history of alcohol or recreational drug. He live with wife. He was able to do activities of daily living.    Allergies  Allergen Reactions  . Amlodipine     REACTION: ankles itching   Family history: Mother died of cancer. Father died of PNA at young age.   Prior to Admission medications   Medication Sig Start Date End Date Taking? Authorizing Frankye Schwegel  alendronate (FOSAMAX) 70 MG tablet Take 1 tablet by mouth Daily. 06/16/11  Yes Historical Akacia Boltz, MD  aspirin 81 MG tablet Take 81 mg by mouth daily.    Yes Historical Ennio Houp, MD  donepezil (ARICEPT) 10 MG tablet Take 10 mg by mouth at  bedtime as needed (Dementia).   Yes Historical Cavin Longman, MD  metoprolol succinate (TOPROL-XL) 25 MG 24 hr tablet TAKE 1/2 TABLET daily 04/30/12  Yes Wendall Stade, MD  mupirocin ointment (BACTROBAN) 2 % Apply topically 3 (three) times daily. 05/10/12  Yes Jonita Albee, MD  valsartan-hydrochlorothiazide (DIOVAN-HCT) 320-12.5 MG per tablet Take 1 tablet by mouth daily.   Yes Historical Cesia Orf, MD   Physical Exam: Filed Vitals:   06/08/12 0950 06/08/12 1045 06/08/12 1050 06/08/12 1100  BP: 146/78 127/68 127/68 141/57  Pulse: 98 74  69  Temp: 97.5 F (36.4 C)     TempSrc: Oral     Resp:  14 17 15   SpO2: 100% 98% 99% 99%    General Appearance:    Alert, no distress, appears stated age  Head:    Normocephalic, without obvious abnormality, atraumatic  Eyes:    PERRL, conjunctiva/corneas clear, EOM's intact         Ears:    Normal TM's and external ear canals, both ears  Nose:   Nares normal, septum midline, mucosa normal, no drainage    or sinus tenderness  Throat:   Lips, mucosa, normal.  tongue with bite mark, echimosis.   Neck:   Supple, symmetrical, trachea midline, no adenopathy;       thyroid:  No enlargement/tenderness/nodules; no carotid   bruit or JVD     Lungs:     Clear to auscultation bilaterally, respirations unlabored  Chest wall:  No tenderness or deformity  Heart:    Regular rate and rhythm, S1 and S2 normal, Systolic  murmur, rub   or gallop  Abdomen:     Soft, non-tender, bowel sounds active all four quadrants,    no masses, no organomegaly        Extremities:   Extremities normal, atraumatic, no cyanosis or edema  Pulses:   2+ and symmetric all extremities  Skin:   Skin color, texture, turgor normal, no rashes or lesions  Lymph nodes:   Cervical, supraclavicular, and axillary nodes normal  Neurologic:   Not following command, aphasia expressive and receptive, not following command, CNII-XII intact. Normal strength, sensation and reflexes      throughout   Labs  on Admission:  Basic Metabolic Panel:  Recent Labs Lab 06/08/12 1023 06/08/12 1037  NA 129* 130*  K 4.3 4.3  CL 93* 96  CO2 25  --   GLUCOSE 127* 127*  BUN 15 16  CREATININE 0.90 1.00  CALCIUM 9.4  --    Liver Function Tests:  Recent Labs Lab 06/08/12 1023  AST 26  ALT 19  ALKPHOS 44  BILITOT 0.4  PROT 6.8  ALBUMIN 3.9   CBC:  Recent Labs Lab 06/08/12 1023 06/08/12 1037  WBC 13.0*  --   NEUTROABS 10.6*  --   HGB 13.1 13.3  HCT 36.3* 39.0  MCV 97.6  --   PLT 242  --    Cardiac Enzymes:  Recent Labs Lab 06/08/12 1024  TROPONINI <0.30    BNP (last 3 results) No results found for this basename: PROBNP,  in the last 8760 hours CBG:  Recent Labs Lab 06/08/12 1023  GLUCAP 136*    Radiological Exams on Admission: Ct Head Wo Contrast  06/08/2012  *RADIOLOGY REPORT*  Clinical Data: Altered mental status  CT HEAD WITHOUT CONTRAST  Technique:  Contiguous axial images were obtained from the base of the skull through the vertex without contrast.  Comparison: 09/28/2011  Findings: Chronic ischemic changes.  Global atrophy.  No mass effect, midline shift, or acute intracranial hemorrhage.  A right MCA branch within the right sylvian fissure is hyperdense on image 17.  Intravascular thrombus cannot be excluded.  IMPRESSION: Hyperdense right MCA branch as described.  Vessel thrombus cannot be excluded.  Chronic ischemic changes and global atrophy are noted. Critical Value/emergent results were called by telephone at the time of interpretation on 06/08/2012 at 1050 hours to Dr. Rubin Payor, who verbally acknowledged these results.   Original Report Authenticated By: Jolaine Click, M.D.    Dg Chest Port 1 View  06/08/2012  *RADIOLOGY REPORT*  Clinical Data: Altered mental status  PORTABLE CHEST - 1 VIEW  Comparison: 03/05/2008  Findings: Cardiomediastinal silhouette is stable.  There is poor inspiration.  No acute infiltrate or pulmonary edema.  Mild basilar atelectasis.   Degenerative changes bilateral shoulders.  IMPRESSION: Poor inspiration.  No acute infiltrate or pulmonary edema.  Mild basilar atelectasis.   Original Report Authenticated By: Natasha Mead, M.D.     EKG: Independently reviewed. Sinus rhythm, no ST segment Elevation.   Assessment/Plan Active Problems:   Stroke   Essential hypertension, benign   CORONARY ATHEROSCLEROSIS, NATIVE VESSEL   Seizure   1-Seizure: Patient was loaded with Keppra. Will continue with Keppra 500 mg IV BID. MRI brain evaluate for Stroke. Neurology recommendation appreciated. EEG. Seizure precaution. 2-Aphasia: MRI ordered to evaluate for stroke. Will allow permissive hypertension in case of acute stroke. Aspirin ordered. If MRI positive  for stroke will need to order , carotid doppler.  3-Increase troponin: NSTEMI, vs mild elevation demand ischemia, vs secondary to stroke. Cycle troponin. Continue with metoprolol, aspirin. Check fasting lipid panel. Cardiology Consulted. Will order ECHO.  4-Hypertension: permissive hypertension in case acute stroke. Hold HCTZ. 5-Dementia: Continue with Donepezil if pass swallow evaluation.    Code Status: DNR, discussed with wife who is POA.  Family Communication: Care discussed with Wife.  Disposition Plan: Expect 3  To 4 days inpatient.   Time spent: more than 60 minutes.   REGALADO,BELKYS [facility] Pager [phone]  If 7PM-7AM, please contact night-coverage [email or web address] Password [email or web address] 06/08/2012, 11:30 AM

## 2012-06-08 NOTE — ED Provider Notes (Signed)
History     CSN: [id number]  Arrival date & time 06/08/12  0944   First MD Initiated Contact with Patient 06/08/12 1015     Level 5 caveat due to aphasia Chief Complaint  Patient presents with  . Code Stroke    (Consider location/radiation/quality/duration/timing/severity/associated sxs/prior treatment) The history is provided by the patient and the spouse.  patient presents with altered mental status and difficulty speaking. Last normal was either 7:30 this morning or 8:30. Will need to be clarified with the wife. He reportedly had slumped down some. No trauma. No fevers. He has some mild dementia.  Past Medical History  Diagnosis Date  . CAD (coronary artery disease)     Stent to RCA in 2005  . Hyperlipidemia   . Essential hypertension, benign   . CORONARY ATHEROSCLEROSIS, NATIVE VESSEL   . Dementia     Past Surgical History  Procedure Laterality Date  . Replacement total knee bilateral    . Inguinal hernia repair    . Coronary angioplasty with stent placement  2005    History reviewed. No pertinent family history.  History  Substance Use Topics  . Smoking status: Never Smoker   . Smokeless tobacco: Not on file  . Alcohol Use: No      Review of Systems  Unable to perform ROS: Mental status change    Allergies  Amlodipine  Home Medications   No current outpatient prescriptions on file.  BP 112/62  Pulse 66  Temp(Src) 97.9 F (36.6 C) (Oral)  Resp 12  Ht 5\' 9"  (1.753 m)  Wt 137 lb 9.1 oz (62.4 kg)  BMI 20.31 kg/m2  SpO2 98%  Physical Exam  Constitutional: He appears well-developed.  HENT:  Head: Normocephalic.  Eyes: Pupils are equal, round, and reactive to light.  Cardiovascular: Normal rate and regular rhythm.   Pulmonary/Chest: Effort normal and breath sounds normal.  Abdominal: Soft. Bowel sounds are normal.  Musculoskeletal: Normal range of motion. He exhibits no edema.  Neurological: He is alert.  Patient appears to move all  extremities. He will speak some inappropriate words and would not follow commands. He is awake and appears alert.  Skin: Skin is warm.    ED Course  Procedures (including critical care time)  Labs Reviewed  CBC - Abnormal; Notable for the following:    WBC 13.0 (*)    RBC 3.72 (*)    HCT 36.3 (*)    MCH 35.2 (*)    MCHC 36.1 (*)    All other components within normal limits  DIFFERENTIAL - Abnormal; Notable for the following:    Neutrophils Relative 82 (*)    Neutro Abs 10.6 (*)    Lymphocytes Relative 9 (*)    All other components within normal limits  COMPREHENSIVE METABOLIC PANEL - Abnormal; Notable for the following:    Sodium 129 (*)    Chloride 93 (*)    Glucose, Bld 127 (*)    GFR calc non Af Amer 75 (*)    GFR calc Af Amer 87 (*)    All other components within normal limits  URINALYSIS, ROUTINE W REFLEX MICROSCOPIC - Abnormal; Notable for the following:    APPearance HAZY (*)    Glucose, UA 250 (*)    Hgb urine dipstick MODERATE (*)    Protein, ur 100 (*)    All other components within normal limits  GLUCOSE, CAPILLARY - Abnormal; Notable for the following:    Glucose-Capillary 136 (*)    All  other components within normal limits  TROPONIN I - Abnormal; Notable for the following:    Troponin I 1.09 (*)    All other components within normal limits  TROPONIN I - Abnormal; Notable for the following:    Troponin I 1.77 (*)    All other components within normal limits  TROPONIN I - Abnormal; Notable for the following:    Troponin I 1.66 (*)    All other components within normal limits  CBC - Abnormal; Notable for the following:    WBC 11.8 (*)    RBC 3.72 (*)    HCT 36.2 (*)    MCH 34.9 (*)    All other components within normal limits  CREATININE, SERUM - Abnormal; Notable for the following:    GFR calc non Af Amer 79 (*)    All other components within normal limits  BASIC METABOLIC PANEL - Abnormal; Notable for the following:    Sodium 128 (*)    Chloride 93  (*)    GFR calc non Af Amer 74 (*)    GFR calc Af Amer 86 (*)    All other components within normal limits  CBC - Abnormal; Notable for the following:    RBC 3.52 (*)    Hemoglobin 12.1 (*)    HCT 34.5 (*)    MCH 34.4 (*)    All other components within normal limits  RETICULOCYTES - Abnormal; Notable for the following:    RBC. 3.50 (*)    All other components within normal limits  POCT I-STAT, CHEM 8 - Abnormal; Notable for the following:    Sodium 130 (*)    Glucose, Bld 127 (*)    All other components within normal limits  POCT I-STAT TROPONIN I - Abnormal; Notable for the following:    Troponin i, poc 0.14 (*)    All other components within normal limits  MRSA PCR SCREENING  PROTIME-INR  APTT  TROPONIN I  URINE MICROSCOPIC-ADD ON  LIPID PANEL  VITAMIN B12  FOLATE  IRON AND TIBC  FERRITIN   Ct Head Wo Contrast  06/08/2012  *RADIOLOGY REPORT*  Clinical Data: Altered mental status  CT HEAD WITHOUT CONTRAST  Technique:  Contiguous axial images were obtained from the base of the skull through the vertex without contrast.  Comparison: 09/28/2011  Findings: Chronic ischemic changes.  Global atrophy.  No mass effect, midline shift, or acute intracranial hemorrhage.  A right MCA branch within the right sylvian fissure is hyperdense on image 17.  Intravascular thrombus cannot be excluded.  IMPRESSION: Hyperdense right MCA branch as described.  Vessel thrombus cannot be excluded.  Chronic ischemic changes and global atrophy are noted. Critical Value/emergent results were called by telephone at the time of interpretation on 06/08/2012 at 1050 hours to Dr. Rubin Payor, who verbally acknowledged these results.   Original Report Authenticated By: Jolaine Click, M.D.    Mr Brain Wo Contrast  06/08/2012  *RADIOLOGY REPORT*  Clinical Data: Acute onset of confusion.  Expressive aphasia.  MRI HEAD WITHOUT CONTRAST  Technique:  Multiplanar, multiecho pulse sequences of the brain and surrounding structures  were obtained according to standard protocol without intravenous contrast.  Comparison: 06/08/2012 CT.  09/28/2011 MR.  Findings: Motion degraded exam.  No acute infarct.  No intracranial hemorrhage.  Moderate global atrophy.  Ventricular prominence may be related to atrophy although difficult to completely exclude mild component hydrocephalus.  Moderate small vessel disease type changes.  No intracranial mass lesion detected on this unenhanced  exam.  Major intracranial vascular structures are patent.  Atherosclerotic type changes with narrowing left vertebral artery suspected.  Transverse ligament hypertrophy.  Prominent arachnoid granulations incidentally noted in the occipital bone.  IMPRESSION: No acute infarct.  Please see above.   Original Report Authenticated By: Lacy Duverney, M.D.    Dg Chest Port 1 View  06/08/2012  *RADIOLOGY REPORT*  Clinical Data: Altered mental status  PORTABLE CHEST - 1 VIEW  Comparison: 03/05/2008  Findings: Cardiomediastinal silhouette is stable.  There is poor inspiration.  No acute infiltrate or pulmonary edema.  Mild basilar atelectasis.  Degenerative changes bilateral shoulders.  IMPRESSION: Poor inspiration.  No acute infiltrate or pulmonary edema.  Mild basilar atelectasis.   Original Report Authenticated By: Natasha Mead, M.D.      1. Seizure   2. Aphasia   3. Coronary atherosclerosis of native coronary artery   4. Dementia   5. Essential hypertension, benign   6. Elevated troponin   7. LV (left ventricular) mural thrombus       MDM  Patient with altered mental status and had episode of aphasia. Possible seizure activity per neurology. CT is reassuring. Mental status has improved somewhat. He'll be admitted to medicine.        Juliet Rude. Rubin Payor, MD 06/09/12 [phone]

## 2012-06-09 DIAGNOSIS — I219 Acute myocardial infarction, unspecified: Secondary | ICD-10-CM

## 2012-06-09 DIAGNOSIS — I513 Intracardiac thrombosis, not elsewhere classified: Secondary | ICD-10-CM | POA: Diagnosis present

## 2012-06-09 DIAGNOSIS — R569 Unspecified convulsions: Secondary | ICD-10-CM

## 2012-06-09 DIAGNOSIS — R4701 Aphasia: Secondary | ICD-10-CM | POA: Diagnosis not present

## 2012-06-09 DIAGNOSIS — R7989 Other specified abnormal findings of blood chemistry: Secondary | ICD-10-CM | POA: Diagnosis not present

## 2012-06-09 DIAGNOSIS — F0390 Unspecified dementia without behavioral disturbance: Secondary | ICD-10-CM | POA: Diagnosis not present

## 2012-06-09 LAB — CBC
HCT: 34.5 % — ABNORMAL LOW (ref 39.0–52.0)
Hemoglobin: 12.1 g/dL — ABNORMAL LOW (ref 13.0–17.0)
MCH: 34.4 pg — ABNORMAL HIGH (ref 26.0–34.0)
MCHC: 35.1 g/dL (ref 30.0–36.0)
MCV: 98 fL (ref 78.0–100.0)

## 2012-06-09 LAB — RETICULOCYTES: Retic Ct Pct: 1.3 % (ref 0.4–3.1)

## 2012-06-09 LAB — BASIC METABOLIC PANEL
BUN: 13 mg/dL (ref 6–23)
Chloride: 93 mEq/L — ABNORMAL LOW (ref 96–112)
Glucose, Bld: 98 mg/dL (ref 70–99)
Potassium: 3.7 mEq/L (ref 3.5–5.1)

## 2012-06-09 MED ORDER — HALOPERIDOL LACTATE 5 MG/ML IJ SOLN
1.0000 mg | Freq: Four times a day (QID) | INTRAMUSCULAR | Status: DC | PRN
Start: 2012-06-09 — End: 2012-06-10
  Administered 2012-06-09: 1 mg via INTRAVENOUS
  Filled 2012-06-09 (×2): qty 0.2

## 2012-06-09 MED ORDER — QUETIAPINE 12.5 MG HALF TABLET
12.5000 mg | ORAL_TABLET | Freq: Every day | ORAL | Status: DC
  Administered 2012-06-09: 12.5 mg via ORAL
  Filled 2012-06-09 (×2): qty 1

## 2012-06-09 MED ORDER — IRBESARTAN 300 MG PO TABS
300.0000 mg | ORAL_TABLET | Freq: Every day | ORAL | Status: DC
  Administered 2012-06-09 – 2012-06-11 (×2): 300 mg via ORAL
  Filled 2012-06-09 (×4): qty 1

## 2012-06-09 NOTE — Procedures (Signed)
EEG NUMBER:  [id number].  INDICATION FOR STUDY:  An 79 year old man presenting with new onset of generalized seizure activity as well as a history of dementia.  DESCRIPTION:  This was a routine EEG recording performed during wakefulness.  The predominant background activity consisted of low amplitude, diffuse, irregular, but symmetrical, 1 to 2 Hz delta activity with superimposed mixed alpha and theta activity which was most prominent posteriorly.  Photic stimulation produced a symmetrical occipital driving response.  Hyperventilation was not performed.  No epileptiform discharges were recorded.  INTERPRETATION:  The EEG showed mild generalized nonspecific slowing of cerebral activity.  No evidence of an epileptic disorder was demonstrated.     Noel Christmas, MD    [id number] D:  06/08/2012 17:30:42  T:  06/09/2012 07:35:26  Job #:  [id number]

## 2012-06-09 NOTE — Progress Notes (Signed)
[facility] Progress Note [place] TEAM 1 - Stepdown/ICU TEAM   Austin Gallagher ZOX:[id number] DOB: 1925-12-03 DOA: 06/08/2012 PCP: Rene Paci, MD  Brief narrative: 79 y.o. male with PMH significant for CAD, Hyperlipidemia, HTN, mild dementia who was in his usual state of health until the morning of admission around 8 am when wife noticed AMS. Patient was sitting eating breakfast, wife heard a noise, he slumped over and was moaning, then started to shake all over. He bit his tongue. There was mention of urinary incontinence. He presented with expressive and apparent receptive aphasia. Code stroke was called and subsequently canceled by Neurology. Patient was unable to provide history at the time of his presentation due to aphasia.  Patient was also notice to have increase troponin during his initial w/u. Cardiology was consulted.   Assessment/Plan:  Seizure Was loaded w/ Keppra - Neurology following - waking up/returning to baseline - begin PT/OT - transfer to Neuro floor   Aphasia CT head w/o acute findings at time of presentation - likely a feature related to his Sz/postictal state - sx have resolved as of this morning - MRI w/o acute findings - cleared for D3 diet w/ thin liquids per SLP  Hyponatremia  Renal fxn is normal - appears to have a baseline Na of 127-133 (dating as far back as 2008) - will NOT resume HCTZ component of his home BP meds - follow trend  Elevated troponin w/ hx of CAD s/p PTCA/stent RCA 2005 Is on ASA and BB - observation and med tx at this time  LV apical thrombus Discussed w/ Cardiology - pt is an exceedingly high risk for anticoag given his dementia, new Sz d/o, and high fall risk - we will tx w/ ASA alone   Dementia Not yet at his cognitive baseline per family - will be high risk for sundowning - initiate low dose seroquel tonight   HTN Reasonably controlled at this time - no change in tx plan today   Code Status: NO CODE BLUE Family  Communication: spoke w/ wife at bedside Disposition Plan: transfer to Neuro floor   Consultants: Cardiology  Procedures: 3/7 - TTE - Apical akinesis with probable mural apical thrombus The cavity size was mildly dilated. Wall thickness was normal. Systolic function was mildly reduced. The estimated ejection fraction was in the range of 45% to 50%.  Antibiotics: none  DVT prophylaxis: lovenox  HPI/Subjective: Pt is alert and conversant, though very confused.  His wife reports that he is more confused than per his baseline.  He denies any complaints, and is fixated on going home.   Objective: Blood pressure 109/51, pulse 66, temperature 98.1 F (36.7 C), temperature source Oral, resp. rate 22, height 5\' 9"  (1.753 m), weight 62.4 kg (137 lb 9.1 oz), SpO2 99.00%.  Intake/Output Summary (Last 24 hours) at 06/09/12 0848 Last data filed at 06/09/12 0600  Gross per 24 hour  Intake    430 ml  Output    600 ml  Net   -170 ml   Exam: General: No acute respiratory distress - no focal neuro deficits - pt is alert but not oriented  Lungs: Clear to auscultation bilaterally without wheezes or crackles Cardiovascular: Regular rate and rhythm without murmur gallop or rub normal S1 and S2 Abdomen: Nontender, nondistended, soft, bowel sounds positive, no rebound, no ascites, no appreciable mass Extremities: No significant cyanosis, clubbing, or edema bilateral lower extremities  Data Reviewed: Basic Metabolic Panel:  Recent Labs Lab 06/08/12 1023 06/08/12  1037 06/08/12 1832 06/09/12 0628  NA 129* 130*  --  128*  K 4.3 4.3  --  3.7  CL 93* 96  --  93*  CO2 25  --   --  26  GLUCOSE 127* 127*  --  98  BUN 15 16  --  13  CREATININE 0.90 1.00 0.81 0.94  CALCIUM 9.4  --   --  9.2   Liver Function Tests:  Recent Labs Lab 06/08/12 1023  AST 26  ALT 19  ALKPHOS 44  BILITOT 0.4  PROT 6.8  ALBUMIN 3.9   CBC:  Recent Labs Lab 06/08/12 1023 06/08/12 1037 06/08/12 1832  06/09/12 0628  WBC 13.0*  --  11.8* 8.9  NEUTROABS 10.6*  --   --   --   HGB 13.1 13.3 13.0 12.1*  HCT 36.3* 39.0 36.2* 34.5*  MCV 97.6  --  97.3 98.0  PLT 242  --  262 253   Cardiac Enzymes:  Recent Labs Lab 06/08/12 1024 06/08/12 1206 06/08/12 1832 06/08/12 2235  TROPONINI <0.30 1.09* 1.77* 1.66*   CBG:  Recent Labs Lab 06/08/12 1023  GLUCAP 136*    Recent Results (from the past 240 hour(s))  MRSA PCR SCREENING     Status: None   Collection Time    06/08/12  8:00 PM      Result Value Range Status   MRSA by PCR NEGATIVE  NEGATIVE Final   Comment:            The GeneXpert MRSA Assay (FDA     approved for NASAL specimens     only), is one component of a     comprehensive MRSA colonization     surveillance program. It is not     intended to diagnose MRSA     infection nor to guide or     monitor treatment for     MRSA infections.     Studies:  Recent x-ray studies have been reviewed in detail by the Attending Physician  Scheduled Meds:  Scheduled Meds: . aspirin EC  81 mg Oral Daily  . aspirin  300 mg Rectal Daily  . enoxaparin (LOVENOX) injection  30 mg Subcutaneous Q24H  . levETIRAcetam  500 mg Intravenous Q12H  . metoprolol succinate  12.5 mg Oral Daily  . sodium chloride  3 mL Intravenous Q12H   Continuous Infusions: . sodium chloride      Time spent on care of this patient: 68   [facility] T  [facility] Office  [phone] Pager - Text Page per Loretha Stapler as per below:  On-Call/Text Page:      [email or web address]      password [email or web address]  If 7PM-7AM, please contact night-coverage [email or web address] Password [facility] 06/09/2012, 8:48 AM   LOS: 1 day

## 2012-06-09 NOTE — Consult Note (Signed)
PCP: Rene Paci, MD Cardiologist: PN  SUBJECTIVE:  Much more awake. Pleasantly confused. No CP/SOB.  Echo reviewed EF 45-50%. Apical HK with possible mural thrombus,   Results of head CT and EEG noted.  Trop stable 1.1-1.7    PHYSICAL EXAM: Filed Vitals:   06/09/12 0900  BP:   Pulse:   Temp:   Resp: 18   General:  Well appearing elderly white male. confused HEENT: normal Neck: supple. no JVD. Carotids 2+ bilat; no bruits. No lymphadenopathy or thryomegaly appreciated. Cor: PMI nondisplaced. Regular rate & rhythm. No rubs, gallops or murmurs. Lungs: clear to auscultation bilaterally Abdomen: soft, nontender, nondistended. No hepatosplenomegaly. No bruits or masses. Good bowel sounds. Extremities: no cyanosis, clubbing, rash, edema Neuro: alert & oriented x 1, responds to his name.  Has expressive and receptive aphasia. He moves all 4 extremities w/o difficulty. Affect pleasant but not oriented.  Results for orders placed during the hospital encounter of 06/08/12 (from the past 24 hour(s))  PROTIME-INR     Status: None   Collection Time    06/08/12 10:23 AM      Result Value Range   Prothrombin Time 13.3  11.6 - 15.2 seconds   INR 1.02  0.00 - 1.49  APTT     Status: None   Collection Time    06/08/12 10:23 AM      Result Value Range   aPTT 29  24 - 37 seconds  CBC     Status: Abnormal   Collection Time    06/08/12 10:23 AM      Result Value Range   WBC 13.0 (*) 4.0 - 10.5 K/uL   RBC 3.72 (*) 4.22 - 5.81 MIL/uL   Hemoglobin 13.1  13.0 - 17.0 g/dL   HCT 78.2 (*) 95.6 - 21.3 %   MCV 97.6  78.0 - 100.0 fL   MCH 35.2 (*) 26.0 - 34.0 pg   MCHC 36.1 (*) 30.0 - 36.0 g/dL   RDW 08.6  57.8 - 46.9 %   Platelets 242  150 - 400 K/uL  DIFFERENTIAL     Status: Abnormal   Collection Time    06/08/12 10:23 AM      Result Value Range   Neutrophils Relative 82 (*) 43 - 77 %   Neutro Abs 10.6 (*) 1.7 - 7.7 K/uL   Lymphocytes Relative 9 (*) 12 - 46 %   Lymphs Abs 1.2   0.7 - 4.0 K/uL   Monocytes Relative 7  3 - 12 %   Monocytes Absolute 0.9  0.1 - 1.0 K/uL   Eosinophils Relative 2  0 - 5 %   Eosinophils Absolute 0.2  0.0 - 0.7 K/uL   Basophils Relative 0  0 - 1 %   Basophils Absolute 0.0  0.0 - 0.1 K/uL  COMPREHENSIVE METABOLIC PANEL     Status: Abnormal   Collection Time    06/08/12 10:23 AM      Result Value Range   Sodium 129 (*) 135 - 145 mEq/L   Potassium 4.3  3.5 - 5.1 mEq/L   Chloride 93 (*) 96 - 112 mEq/L   CO2 25  19 - 32 mEq/L   Glucose, Bld 127 (*) 70 - 99 mg/dL   BUN 15  6 - 23 mg/dL   Creatinine, Ser 6.29  0.50 - 1.35 mg/dL   Calcium 9.4  8.4 - 52.8 mg/dL   Total Protein 6.8  6.0 - 8.3 g/dL   Albumin 3.9  3.5 - 5.2 g/dL   AST 26  0 - 37 U/L   ALT 19  0 - 53 U/L   Alkaline Phosphatase 44  39 - 117 U/L   Total Bilirubin 0.4  0.3 - 1.2 mg/dL   GFR calc non Af Amer 75 (*) >90 mL/min   GFR calc Af Amer 87 (*) >90 mL/min  GLUCOSE, CAPILLARY     Status: Abnormal   Collection Time    06/08/12 10:23 AM      Result Value Range   Glucose-Capillary 136 (*) 70 - 99 mg/dL  TROPONIN I     Status: None   Collection Time    06/08/12 10:24 AM      Result Value Range   Troponin I <0.30  <0.30 ng/mL  POCT I-STAT TROPONIN I     Status: Abnormal   Collection Time    06/08/12 10:35 AM      Result Value Range   Troponin i, poc 0.14 (*) 0.00 - 0.08 ng/mL   Comment NOTIFIED PHYSICIAN     Comment 3           POCT I-STAT, CHEM 8     Status: Abnormal   Collection Time    06/08/12 10:37 AM      Result Value Range   Sodium 130 (*) 135 - 145 mEq/L   Potassium 4.3  3.5 - 5.1 mEq/L   Chloride 96  96 - 112 mEq/L   BUN 16  6 - 23 mg/dL   Creatinine, Ser 4.09  0.50 - 1.35 mg/dL   Glucose, Bld 811 (*) 70 - 99 mg/dL   Calcium, Ion 9.14  7.82 - 1.30 mmol/L   TCO2 26  0 - 100 mmol/L   Hemoglobin 13.3  13.0 - 17.0 g/dL   HCT 95.6  21.3 - 08.6 %  URINALYSIS, ROUTINE W REFLEX MICROSCOPIC     Status: Abnormal   Collection Time    06/08/12 10:47 AM       Result Value Range   Color, Urine YELLOW  YELLOW   APPearance HAZY (*) CLEAR   Specific Gravity, Urine 1.013  1.005 - 1.030   pH 6.0  5.0 - 8.0   Glucose, UA 250 (*) NEGATIVE mg/dL   Hgb urine dipstick MODERATE (*) NEGATIVE   Bilirubin Urine NEGATIVE  NEGATIVE   Ketones, ur NEGATIVE  NEGATIVE mg/dL   Protein, ur 578 (*) NEGATIVE mg/dL   Urobilinogen, UA 0.2  0.0 - 1.0 mg/dL   Nitrite NEGATIVE  NEGATIVE   Leukocytes, UA NEGATIVE  NEGATIVE  URINE MICROSCOPIC-ADD ON     Status: None   Collection Time    06/08/12 10:47 AM      Result Value Range   Squamous Epithelial / LPF RARE  RARE   WBC, UA 0-2  <3 WBC/hpf   RBC / HPF 3-6  <3 RBC/hpf   Bacteria, UA RARE  RARE   Urine-Other SPERM PRESENT    TROPONIN I     Status: Abnormal   Collection Time    06/08/12 12:06 PM      Result Value Range   Troponin I 1.09 (*) <0.30 ng/mL  LIPID PANEL     Status: None   Collection Time    06/08/12 12:06 PM      Result Value Range   Cholesterol 146  0 - 200 mg/dL   Triglycerides 51  <469 mg/dL   HDL 65  >62 mg/dL   Total CHOL/HDL Ratio 2.2  VLDL 10  0 - 40 mg/dL   LDL Cholesterol 71  0 - 99 mg/dL  TROPONIN I     Status: Abnormal   Collection Time    06/08/12  6:32 PM      Result Value Range   Troponin I 1.77 (*) <0.30 ng/mL  CBC     Status: Abnormal   Collection Time    06/08/12  6:32 PM      Result Value Range   WBC 11.8 (*) 4.0 - 10.5 K/uL   RBC 3.72 (*) 4.22 - 5.81 MIL/uL   Hemoglobin 13.0  13.0 - 17.0 g/dL   HCT 95.6 (*) 21.3 - 08.6 %   MCV 97.3  78.0 - 100.0 fL   MCH 34.9 (*) 26.0 - 34.0 pg   MCHC 35.9  30.0 - 36.0 g/dL   RDW 57.8  46.9 - 62.9 %   Platelets 262  150 - 400 K/uL  CREATININE, SERUM     Status: Abnormal   Collection Time    06/08/12  6:32 PM      Result Value Range   Creatinine, Ser 0.81  0.50 - 1.35 mg/dL   GFR calc non Af Amer 79 (*) >90 mL/min   GFR calc Af Amer >90  >90 mL/min  MRSA PCR SCREENING     Status: None   Collection Time    06/08/12  8:00 PM        Result Value Range   MRSA by PCR NEGATIVE  NEGATIVE  TROPONIN I     Status: Abnormal   Collection Time    06/08/12 10:35 PM      Result Value Range   Troponin I 1.66 (*) <0.30 ng/mL  BASIC METABOLIC PANEL     Status: Abnormal   Collection Time    06/09/12  6:28 AM      Result Value Range   Sodium 128 (*) 135 - 145 mEq/L   Potassium 3.7  3.5 - 5.1 mEq/L   Chloride 93 (*) 96 - 112 mEq/L   CO2 26  19 - 32 mEq/L   Glucose, Bld 98  70 - 99 mg/dL   BUN 13  6 - 23 mg/dL   Creatinine, Ser 5.28  0.50 - 1.35 mg/dL   Calcium 9.2  8.4 - 41.3 mg/dL   GFR calc non Af Amer 74 (*) >90 mL/min   GFR calc Af Amer 86 (*) >90 mL/min  CBC     Status: Abnormal   Collection Time    06/09/12  6:28 AM      Result Value Range   WBC 8.9  4.0 - 10.5 K/uL   RBC 3.52 (*) 4.22 - 5.81 MIL/uL   Hemoglobin 12.1 (*) 13.0 - 17.0 g/dL   HCT 24.4 (*) 01.0 - 27.2 %   MCV 98.0  78.0 - 100.0 fL   MCH 34.4 (*) 26.0 - 34.0 pg   MCHC 35.1  30.0 - 36.0 g/dL   RDW 53.6  64.4 - 03.4 %   Platelets 253  150 - 400 K/uL   Lab Results  Component Value Date   TROPONINI 1.66* 06/08/2012   Ct Head Wo Contrast  06/08/2012  *RADIOLOGY REPORT*  Clinical Data: Altered mental status  CT HEAD WITHOUT CONTRAST  Technique:  Contiguous axial images were obtained from the base of the skull through the vertex without contrast.  Comparison: 09/28/2011  Findings: Chronic ischemic changes.  Global atrophy.  No mass effect, midline shift, or acute intracranial  hemorrhage.  A right MCA branch within the right sylvian fissure is hyperdense on image 17.  Intravascular thrombus cannot be excluded.  IMPRESSION: Hyperdense right MCA branch as described.  Vessel thrombus cannot be excluded.  Chronic ischemic changes and global atrophy are noted. Critical Value/emergent results were called by telephone at the time of interpretation on 06/08/2012 at 1050 hours to Dr. Rubin Payor, who verbally acknowledged these results.   Original Report Authenticated  By: Jolaine Click, M.D.    Mr Brain Wo Contrast  06/08/2012  *RADIOLOGY REPORT*  Clinical Data: Acute onset of confusion.  Expressive aphasia.  MRI HEAD WITHOUT CONTRAST  Technique:  Multiplanar, multiecho pulse sequences of the brain and surrounding structures were obtained according to standard protocol without intravenous contrast.  Comparison: 06/08/2012 CT.  09/28/2011 MR.  Findings: Motion degraded exam.  No acute infarct.  No intracranial hemorrhage.  Moderate global atrophy.  Ventricular prominence may be related to atrophy although difficult to completely exclude mild component hydrocephalus.  Moderate small vessel disease type changes.  No intracranial mass lesion detected on this unenhanced exam.  Major intracranial vascular structures are patent.  Atherosclerotic type changes with narrowing left vertebral artery suspected.  Transverse ligament hypertrophy.  Prominent arachnoid granulations incidentally noted in the occipital bone.  IMPRESSION: No acute infarct.  Please see above.   Original Report Authenticated By: Lacy Duverney, M.D.    Dg Chest Port 1 View  06/08/2012  *RADIOLOGY REPORT*  Clinical Data: Altered mental status  PORTABLE CHEST - 1 VIEW  Comparison: 03/05/2008  Findings: Cardiomediastinal silhouette is stable.  There is poor inspiration.  No acute infiltrate or pulmonary edema.  Mild basilar atelectasis.  Degenerative changes bilateral shoulders.  IMPRESSION: Poor inspiration.  No acute infiltrate or pulmonary edema.  Mild basilar atelectasis.   Original Report Authenticated By: Natasha Mead, M.D.      ASSESSMENT:  1.  Mixed hyperlipidemia  2.  Essential hypertension, benign  3.   CORONARY ATHEROSCLEROSIS, NATIVE VESSEL  4. Dementia  5. Seizure  6.  Stroke   7.  Elevated Troponin  8. LV 50% small mural thrombus  PLAN/DISCUSSION:  I suspect elevated troponin is related to neuro event and not primarily cardiac. That said, may have component of demand ischemia. No  evidence of arrhythmia on tele. Echo with apical HK and small mural thrombus.   No further cardiac w/u required. Typically we would treat mural thrombus with 3 months of coumadin but given dementia and seizures I do not think he is candidate for anti-coagulation. Suggest ASA 81. Can continue Toprol 25 daily.  We will sign off. Please call with questions. D/w Dr. Sharon Seller at bedside.   Daniel Bensimhon,MD 9:22 AM

## 2012-06-09 NOTE — Progress Notes (Signed)
Report to Gene, RN; pt transferred with tele via wheelchair; reorientation provided; family with pt; questions encouraged and answered

## 2012-06-09 NOTE — Progress Notes (Addendum)
Subjective: No recurrence of seizure activity. Patient remains confused and at times slightly agitated. Speech output is markedly improved. MRI showed no signs of acute stroke.  Objective: Current vital signs: BP 112/62  Pulse 66  Temp(Src) 97.9 F (36.6 C) (Oral)  Resp 14  Ht 5\' 9"  (1.753 m)  Wt 62.4 kg (137 lb 9.1 oz)  BMI 20.31 kg/m2  SpO2 98%  Neurologic Exam: Alert and in no acute distress. He is oriented to person and knows he is in a hospital. Speech output was normal with no signs of residual aphasia. Extraocular movements were full and conjugate. No facial weakness was noted. Speech is normal with no dysarthria. Moved extremities equally and normally with good strength which was symmetrical.  Lab Results: Results for orders placed during the hospital encounter of 06/08/12 (from the past 48 hour(s))  PROTIME-INR     Status: None   Collection Time    06/08/12 10:23 AM      Result Value Range   Prothrombin Time 13.3  11.6 - 15.2 seconds   INR 1.02  0.00 - 1.49  APTT     Status: None   Collection Time    06/08/12 10:23 AM      Result Value Range   aPTT 29  24 - 37 seconds  CBC     Status: Abnormal   Collection Time    06/08/12 10:23 AM      Result Value Range   WBC 13.0 (*) 4.0 - 10.5 K/uL   RBC 3.72 (*) 4.22 - 5.81 MIL/uL   Hemoglobin 13.1  13.0 - 17.0 g/dL   HCT 96.0 (*) 45.4 - 09.8 %   MCV 97.6  78.0 - 100.0 fL   MCH 35.2 (*) 26.0 - 34.0 pg   MCHC 36.1 (*) 30.0 - 36.0 g/dL   RDW 11.9  14.7 - 82.9 %   Platelets 242  150 - 400 K/uL  DIFFERENTIAL     Status: Abnormal   Collection Time    06/08/12 10:23 AM      Result Value Range   Neutrophils Relative 82 (*) 43 - 77 %   Neutro Abs 10.6 (*) 1.7 - 7.7 K/uL   Lymphocytes Relative 9 (*) 12 - 46 %   Lymphs Abs 1.2  0.7 - 4.0 K/uL   Monocytes Relative 7  3 - 12 %   Monocytes Absolute 0.9  0.1 - 1.0 K/uL   Eosinophils Relative 2  0 - 5 %   Eosinophils Absolute 0.2  0.0 - 0.7 K/uL   Basophils Relative 0  0 -  1 %   Basophils Absolute 0.0  0.0 - 0.1 K/uL  COMPREHENSIVE METABOLIC PANEL     Status: Abnormal   Collection Time    06/08/12 10:23 AM      Result Value Range   Sodium 129 (*) 135 - 145 mEq/L   Potassium 4.3  3.5 - 5.1 mEq/L   Chloride 93 (*) 96 - 112 mEq/L   CO2 25  19 - 32 mEq/L   Glucose, Bld 127 (*) 70 - 99 mg/dL   BUN 15  6 - 23 mg/dL   Creatinine, Ser 5.62  0.50 - 1.35 mg/dL   Calcium 9.4  8.4 - 13.0 mg/dL   Total Protein 6.8  6.0 - 8.3 g/dL   Albumin 3.9  3.5 - 5.2 g/dL   AST 26  0 - 37 U/L   ALT 19  0 - 53 U/L   Alkaline Phosphatase  44  39 - 117 U/L   Total Bilirubin 0.4  0.3 - 1.2 mg/dL   GFR calc non Af Amer 75 (*) >90 mL/min   GFR calc Af Amer 87 (*) >90 mL/min   Comment:            The eGFR has been calculated     using the CKD EPI equation.     This calculation has not been     validated in all clinical     situations.     eGFR's persistently     <90 mL/min signify     possible Chronic Kidney Disease.  GLUCOSE, CAPILLARY     Status: Abnormal   Collection Time    06/08/12 10:23 AM      Result Value Range   Glucose-Capillary 136 (*) 70 - 99 mg/dL  TROPONIN I     Status: None   Collection Time    06/08/12 10:24 AM      Result Value Range   Troponin I <0.30  <0.30 ng/mL   Comment:            Due to the release kinetics of cTnI,     a negative result within the first hours     of the onset of symptoms does not rule out     myocardial infarction with certainty.     If myocardial infarction is still suspected,     repeat the test at appropriate intervals.  POCT I-STAT TROPONIN I     Status: Abnormal   Collection Time    06/08/12 10:35 AM      Result Value Range   Troponin i, poc 0.14 (*) 0.00 - 0.08 ng/mL   Comment NOTIFIED PHYSICIAN     Comment 3            Comment: Due to the release kinetics of cTnI,     a negative result within the first hours     of the onset of symptoms does not rule out     myocardial infarction with certainty.     If  myocardial infarction is still suspected,     repeat the test at appropriate intervals.  POCT I-STAT, CHEM 8     Status: Abnormal   Collection Time    06/08/12 10:37 AM      Result Value Range   Sodium 130 (*) 135 - 145 mEq/L   Potassium 4.3  3.5 - 5.1 mEq/L   Chloride 96  96 - 112 mEq/L   BUN 16  6 - 23 mg/dL   Creatinine, Ser 8.11  0.50 - 1.35 mg/dL   Glucose, Bld 914 (*) 70 - 99 mg/dL   Calcium, Ion 7.82  9.56 - 1.30 mmol/L   TCO2 26  0 - 100 mmol/L   Hemoglobin 13.3  13.0 - 17.0 g/dL   HCT 21.3  08.6 - 57.8 %  URINALYSIS, ROUTINE W REFLEX MICROSCOPIC     Status: Abnormal   Collection Time    06/08/12 10:47 AM      Result Value Range   Color, Urine YELLOW  YELLOW   APPearance HAZY (*) CLEAR   Specific Gravity, Urine 1.013  1.005 - 1.030   pH 6.0  5.0 - 8.0   Glucose, UA 250 (*) NEGATIVE mg/dL   Hgb urine dipstick MODERATE (*) NEGATIVE   Bilirubin Urine NEGATIVE  NEGATIVE   Ketones, ur NEGATIVE  NEGATIVE mg/dL   Protein, ur 469 (*) NEGATIVE mg/dL   Urobilinogen,  UA 0.2  0.0 - 1.0 mg/dL   Nitrite NEGATIVE  NEGATIVE   Leukocytes, UA NEGATIVE  NEGATIVE  URINE MICROSCOPIC-ADD ON     Status: None   Collection Time    06/08/12 10:47 AM      Result Value Range   Squamous Epithelial / LPF RARE  RARE   WBC, UA 0-2  <3 WBC/hpf   RBC / HPF 3-6  <3 RBC/hpf   Bacteria, UA RARE  RARE   Urine-Other SPERM PRESENT    TROPONIN I     Status: Abnormal   Collection Time    06/08/12 12:06 PM      Result Value Range   Troponin I 1.09 (*) <0.30 ng/mL   Comment:            Due to the release kinetics of cTnI,     a negative result within the first hours     of the onset of symptoms does not rule out     myocardial infarction with certainty.     If myocardial infarction is still suspected,     repeat the test at appropriate intervals.     CRITICAL RESULT CALLED TO, READ BACK BY AND VERIFIED WITH:     K. ADKINS RN. @1322  ON 06/08/12 BY SASSUD/BARRK MT  LIPID PANEL     Status: None    Collection Time    06/08/12 12:06 PM      Result Value Range   Cholesterol 146  0 - 200 mg/dL   Triglycerides 51  <161 mg/dL   HDL 65  >09 mg/dL   Total CHOL/HDL Ratio 2.2     VLDL 10  0 - 40 mg/dL   LDL Cholesterol 71  0 - 99 mg/dL   Comment:            Total Cholesterol/HDL:CHD Risk     Coronary Heart Disease Risk Table                         Men   Women      1/2 Average Risk   3.4   3.3      Average Risk       5.0   4.4      2 X Average Risk   9.6   7.1      3 X Average Risk  23.4   11.0                Use the calculated Patient Ratio     above and the CHD Risk Table     to determine the patient's CHD Risk.                ATP III CLASSIFICATION (LDL):      <100     mg/dL   Optimal      604-540  mg/dL   Near or Above                        Optimal      130-159  mg/dL   Borderline      981-191  mg/dL   High      >478     mg/dL   Very High  TROPONIN I     Status: Abnormal   Collection Time    06/08/12  6:32 PM      Result Value Range   Troponin I 1.77 (*) <0.30 ng/mL  Comment:            Due to the release kinetics of cTnI,     a negative result within the first hours     of the onset of symptoms does not rule out     myocardial infarction with certainty.     If myocardial infarction is still suspected,     repeat the test at appropriate intervals.     CRITICAL VALUE NOTED.  VALUE IS CONSISTENT WITH PREVIOUSLY REPORTED AND CALLED VALUE.  CBC     Status: Abnormal   Collection Time    06/08/12  6:32 PM      Result Value Range   WBC 11.8 (*) 4.0 - 10.5 K/uL   RBC 3.72 (*) 4.22 - 5.81 MIL/uL   Hemoglobin 13.0  13.0 - 17.0 g/dL   HCT 16.1 (*) 09.6 - 04.5 %   MCV 97.3  78.0 - 100.0 fL   MCH 34.9 (*) 26.0 - 34.0 pg   MCHC 35.9  30.0 - 36.0 g/dL   RDW 40.9  81.1 - 91.4 %   Platelets 262  150 - 400 K/uL  CREATININE, SERUM     Status: Abnormal   Collection Time    06/08/12  6:32 PM      Result Value Range   Creatinine, Ser 0.81  0.50 - 1.35 mg/dL   GFR calc non Af  Amer 79 (*) >90 mL/min   GFR calc Af Amer >90  >90 mL/min   Comment:            The eGFR has been calculated     using the CKD EPI equation.     This calculation has not been     validated in all clinical     situations.     eGFR's persistently     <90 mL/min signify     possible Chronic Kidney Disease.  MRSA PCR SCREENING     Status: None   Collection Time    06/08/12  8:00 PM      Result Value Range   MRSA by PCR NEGATIVE  NEGATIVE   Comment:            The GeneXpert MRSA Assay (FDA     approved for NASAL specimens     only), is one component of a     comprehensive MRSA colonization     surveillance program. It is not     intended to diagnose MRSA     infection nor to guide or     monitor treatment for     MRSA infections.  TROPONIN I     Status: Abnormal   Collection Time    06/08/12 10:35 PM      Result Value Range   Troponin I 1.66 (*) <0.30 ng/mL   Comment:            Due to the release kinetics of cTnI,     a negative result within the first hours     of the onset of symptoms does not rule out     myocardial infarction with certainty.     If myocardial infarction is still suspected,     repeat the test at appropriate intervals.     CRITICAL VALUE NOTED.  VALUE IS CONSISTENT WITH PREVIOUSLY REPORTED AND CALLED VALUE.  BASIC METABOLIC PANEL     Status: Abnormal   Collection Time    06/09/12  6:28 AM      Result Value  Range   Sodium 128 (*) 135 - 145 mEq/L   Potassium 3.7  3.5 - 5.1 mEq/L   Chloride 93 (*) 96 - 112 mEq/L   CO2 26  19 - 32 mEq/L   Glucose, Bld 98  70 - 99 mg/dL   BUN 13  6 - 23 mg/dL   Creatinine, Ser 1.61  0.50 - 1.35 mg/dL   Calcium 9.2  8.4 - 09.6 mg/dL   GFR calc non Af Amer 74 (*) >90 mL/min   GFR calc Af Amer 86 (*) >90 mL/min   Comment:            The eGFR has been calculated     using the CKD EPI equation.     This calculation has not been     validated in all clinical     situations.     eGFR's persistently     <90 mL/min  signify     possible Chronic Kidney Disease.  CBC     Status: Abnormal   Collection Time    06/09/12  6:28 AM      Result Value Range   WBC 8.9  4.0 - 10.5 K/uL   RBC 3.52 (*) 4.22 - 5.81 MIL/uL   Hemoglobin 12.1 (*) 13.0 - 17.0 g/dL   HCT 04.5 (*) 40.9 - 81.1 %   MCV 98.0  78.0 - 100.0 fL   MCH 34.4 (*) 26.0 - 34.0 pg   MCHC 35.1  30.0 - 36.0 g/dL   RDW 91.4  78.2 - 95.6 %   Platelets 253  150 - 400 K/uL  RETICULOCYTES     Status: Abnormal   Collection Time    06/09/12 10:57 AM      Result Value Range   Retic Ct Pct 1.3  0.4 - 3.1 %   RBC. 3.50 (*) 4.22 - 5.81 MIL/uL   Retic Count, Manual 45.5  19.0 - 186.0 K/uL    Studies/Results: Ct Head Wo Contrast  06/08/2012  *RADIOLOGY REPORT*  Clinical Data: Altered mental status  CT HEAD WITHOUT CONTRAST  Technique:  Contiguous axial images were obtained from the base of the skull through the vertex without contrast.  Comparison: 09/28/2011  Findings: Chronic ischemic changes.  Global atrophy.  No mass effect, midline shift, or acute intracranial hemorrhage.  A right MCA branch within the right sylvian fissure is hyperdense on image 17.  Intravascular thrombus cannot be excluded.  IMPRESSION: Hyperdense right MCA branch as described.  Vessel thrombus cannot be excluded.  Chronic ischemic changes and global atrophy are noted. Critical Value/emergent results were called by telephone at the time of interpretation on 06/08/2012 at 1050 hours to Dr. Rubin Payor, who verbally acknowledged these results.   Original Report Authenticated By: Jolaine Click, M.D.    Mr Brain Wo Contrast  06/08/2012  *RADIOLOGY REPORT*  Clinical Data: Acute onset of confusion.  Expressive aphasia.  MRI HEAD WITHOUT CONTRAST  Technique:  Multiplanar, multiecho pulse sequences of the brain and surrounding structures were obtained according to standard protocol without intravenous contrast.  Comparison: 06/08/2012 CT.  09/28/2011 MR.  Findings: Motion degraded exam.  No acute  infarct.  No intracranial hemorrhage.  Moderate global atrophy.  Ventricular prominence may be related to atrophy although difficult to completely exclude mild component hydrocephalus.  Moderate small vessel disease type changes.  No intracranial mass lesion detected on this unenhanced exam.  Major intracranial vascular structures are patent.  Atherosclerotic type changes with narrowing left vertebral artery suspected.  Transverse ligament hypertrophy.  Prominent arachnoid granulations incidentally noted in the occipital bone.  IMPRESSION: No acute infarct.  Please see above.   Original Report Authenticated By: Lacy Duverney, M.D.    Dg Chest Port 1 View  06/08/2012  *RADIOLOGY REPORT*  Clinical Data: Altered mental status  PORTABLE CHEST - 1 VIEW  Comparison: 03/05/2008  Findings: Cardiomediastinal silhouette is stable.  There is poor inspiration.  No acute infiltrate or pulmonary edema.  Mild basilar atelectasis.  Degenerative changes bilateral shoulders.  IMPRESSION: Poor inspiration.  No acute infiltrate or pulmonary edema.  Mild basilar atelectasis.   Original Report Authenticated By: Natasha Mead, M.D.     Medications:  I have reviewed the patient's current medications. Scheduled: . aspirin EC  81 mg Oral Daily  . enoxaparin (LOVENOX) injection  30 mg Subcutaneous Q24H  . irbesartan  300 mg Oral Daily  . levETIRAcetam  500 mg Intravenous Q12H  . metoprolol succinate  12.5 mg Oral Daily  . QUEtiapine  12.5 mg Oral QHS  . sodium chloride  3 mL Intravenous Q12H   Continuous:  NWG:[id number], ondansetron (ZOFRAN) IV, ondansetron  Assessment/Plan: 79 year old man with dementia presenting with generalized seizur, likely new onset seizure disorder as a manifestation of progression of dementia. MRI showed no indications of acute ischemic lesion or any signs of intracranial abnormality otherwise. EEG showed generalized slowing with no epileptiform activity demonstrated.  Recommendations: Continue  Keppra 500 mg twice a day for seizure management. No further neurodiagnostic studies are indicated at this point. We will continue to follow this patient with you.  C.R. Roseanne Reno, MD [facility] [phone]  06/09/2012  12:29 PM

## 2012-06-09 NOTE — Progress Notes (Signed)
Pt. Increasingly agitated.  Unable to calm.  Striking out at staff and disoriented x 4.  Wants "to go home" with daughter.  Staff x 3 ambulating him in hall.  Dr. Sharon Seller notified.  Order obtained for Haldol IV.  Difficult to give to patient due to lashing out at staff.  Medication given and lavender oil placed strategically around patient for calming effect.  One on one sitter ordered for safety.  Pt. In recliner with posey vest on due to safety.

## 2012-06-10 DIAGNOSIS — F0390 Unspecified dementia without behavioral disturbance: Secondary | ICD-10-CM | POA: Diagnosis not present

## 2012-06-10 DIAGNOSIS — R7989 Other specified abnormal findings of blood chemistry: Secondary | ICD-10-CM | POA: Diagnosis not present

## 2012-06-10 DIAGNOSIS — R569 Unspecified convulsions: Secondary | ICD-10-CM | POA: Diagnosis not present

## 2012-06-10 DIAGNOSIS — E782 Mixed hyperlipidemia: Secondary | ICD-10-CM

## 2012-06-10 DIAGNOSIS — R4701 Aphasia: Secondary | ICD-10-CM | POA: Diagnosis not present

## 2012-06-10 LAB — CBC
Hemoglobin: 12.4 g/dL — ABNORMAL LOW (ref 13.0–17.0)
MCH: 34.8 pg — ABNORMAL HIGH (ref 26.0–34.0)
MCHC: 35.4 g/dL (ref 30.0–36.0)

## 2012-06-10 LAB — BASIC METABOLIC PANEL
BUN: 12 mg/dL (ref 6–23)
Calcium: 8.7 mg/dL (ref 8.4–10.5)
GFR calc non Af Amer: 76 mL/min — ABNORMAL LOW (ref >90)
Glucose, Bld: 93 mg/dL (ref 70–99)
Potassium: 3.6 mEq/L (ref 3.5–5.1)

## 2012-06-10 LAB — FERRITIN: Ferritin: 310 ng/mL (ref 22–322)

## 2012-06-10 MED ORDER — LORAZEPAM 2 MG/ML IJ SOLN: 1.0000 mg | INTRAMUSCULAR | Status: DC | PRN

## 2012-06-10 MED ORDER — DONEPEZIL HCL 10 MG PO TABS
10.0000 mg | ORAL_TABLET | Freq: Every day | ORAL | Status: DC
  Administered 2012-06-10: 10 mg via ORAL
  Filled 2012-06-10 (×2): qty 1

## 2012-06-10 MED ORDER — SODIUM CHLORIDE 1 G PO TABS
1.0000 g | ORAL_TABLET | Freq: Once | ORAL | Status: AC
  Administered 2012-06-10: 1 g via ORAL
  Filled 2012-06-10: qty 1

## 2012-06-10 MED ORDER — ENOXAPARIN SODIUM 40 MG/0.4ML ~~LOC~~ SOLN
40.0000 mg | SUBCUTANEOUS | Status: DC
  Administered 2012-06-10: 30 mg via SUBCUTANEOUS
  Filled 2012-06-10 (×2): qty 0.4

## 2012-06-10 MED ORDER — LORAZEPAM 2 MG/ML IJ SOLN: 0.5000 mg | INTRAMUSCULAR | Status: DC | PRN

## 2012-06-10 MED ORDER — LORAZEPAM 2 MG/ML IJ SOLN
INTRAMUSCULAR | Status: AC
  Filled 2012-06-10: qty 1

## 2012-06-10 MED ORDER — LORAZEPAM 2 MG/ML IJ SOLN
2.0000 mg | Freq: Once | INTRAMUSCULAR | Status: AC
  Administered 2012-06-10: 2 mg via INTRAVENOUS

## 2012-06-10 NOTE — Progress Notes (Signed)
[facility] PROGRESS NOTE  Austin Gallagher ZOX:[id number] DOB: 1925-10-21 DOA: 06/08/2012 PCP: Rene Paci, MD  Assessment/Plan: Seizure  Was loaded w/ Keppra - Neurology followed while in house and has cleared for discharge.  As per their recommendations Keppra will be continued at 500 mg po twice daily for maintenance dosing.  Aphasia  CT head w/o acute findings at time of presentation - likely a feature related to his Sz/postictal state  - MRI w/o acute findings  - cleared for D3 diet w/ thin liquids per SLP   Hyponatremia  Renal fxn is normal - appears to have a baseline Na of 127-133 (dating as far back as 2008) - will plan on continuing to hold HCTZ - slightly under normal limits today.  Will administer sodium chloride tablet and reassess next am.  Elevated troponin w/ hx of CAD s/p PTCA/stent RCA 2005  Is on ASA and BB - observation and med tx at this time  Cardiology evaluated and impression:  "suspect elevated troponin is related to neuro event and not primarily cardiac."   LV apical thrombus  Was discussed with Cardiology - pt is an exceedingly high risk for anticoag given his dementia, new Sz d/o, and high fall risk - Will continue Aspirin treatment.  Dementia  Patient has been combatant and nursing requested Ativan given that haldol did not help patient with his agitation.  Ativan 2 mg IV was subsequently administered.  Currently patient is somnolent.  Family concerned that he will not be able to ambulate and transfer well at home.   - As such will monitor overnight and decrease ativan dose.  Will manage agitation with low dose ativan at 0.5 mg q 4 hours. - discontinue seroquel  HTN  Reasonably controlled at this time - no change in tx plan today    Code Status: DNR Family Communication: spoke to patient's wife Disposition Plan: Pending further improvement in condition   Consultants:  Neurology  Procedures:  none  Antibiotics:   none  HPI/Subjective: No acute issue reported overnight.  Family is concerned about patient's somnolence after ativan administration.  Also they report that they have no electricity at house and are requesting another overnight stay at the hospital.    Objective: Filed Vitals:   06/09/12 1700 06/09/12 2100 06/10/12 0525 06/10/12 1500  BP:  136/67 154/66 116/68  Pulse:   94 70  Temp:  97 F (36.1 C) 97.3 F (36.3 C) 97.8 F (36.6 C)  TempSrc:   Oral Oral  Resp: 17 16 18 18   Height:      Weight:      SpO2:  98% 98% 97%    Intake/Output Summary (Last 24 hours) at 06/10/12 1546 Last data filed at 06/10/12 0700  Gross per 24 hour  Intake    100 ml  Output    725 ml  Net   -625 ml   Filed Weights   06/08/12 1638 06/09/12 0313  Weight: 61.5 kg (135 lb 9.3 oz) 62.4 kg (137 lb 9.1 oz)    Exam:   General:  Pt in NAD, laying supine in bed resting  Cardiovascular: RRR, no MRG  Respiratory: CTA BL, no wheezes  Abdomen: soft, NT, ND  Musculoskeletal: no cyanosis or clubbing.   Data Reviewed: Basic Metabolic Panel:  Recent Labs Lab 06/08/12 1023 06/08/12 1037 06/08/12 1832 06/09/12 0628 06/10/12 0530  NA 129* 130*  --  128* 126*  K 4.3 4.3  --  3.7 3.6  CL 93* 96  --  93* 91*  CO2 25  --   --  26 24  GLUCOSE 127* 127*  --  98 93  BUN 15 16  --  13 12  CREATININE 0.90 1.00 0.81 0.94 0.89  CALCIUM 9.4  --   --  9.2 8.7   Liver Function Tests:  Recent Labs Lab 06/08/12 1023  AST 26  ALT 19  ALKPHOS 44  BILITOT 0.4  PROT 6.8  ALBUMIN 3.9   No results found for this basename: LIPASE, AMYLASE,  in the last 168 hours No results found for this basename: AMMONIA,  in the last 168 hours CBC:  Recent Labs Lab 06/08/12 1023 06/08/12 1037 06/08/12 1832 06/09/12 0628 06/10/12 0530  WBC 13.0*  --  11.8* 8.9 9.6  NEUTROABS 10.6*  --   --   --   --   HGB 13.1 13.3 13.0 12.1* 12.4*  HCT 36.3* 39.0 36.2* 34.5* 35.0*  MCV 97.6  --  97.3 98.0 98.3  PLT 242  --   262 253 232   Cardiac Enzymes:  Recent Labs Lab 06/08/12 1024 06/08/12 1206 06/08/12 1832 06/08/12 2235  TROPONINI <0.30 1.09* 1.77* 1.66*   BNP (last 3 results) No results found for this basename: PROBNP,  in the last 8760 hours CBG:  Recent Labs Lab 06/08/12 1023  GLUCAP 136*    Recent Results (from the past 240 hour(s))  MRSA PCR SCREENING     Status: None   Collection Time    06/08/12  8:00 PM      Result Value Range Status   MRSA by PCR NEGATIVE  NEGATIVE Final   Comment:            The GeneXpert MRSA Assay (FDA     approved for NASAL specimens     only), is one component of a     comprehensive MRSA colonization     surveillance program. It is not     intended to diagnose MRSA     infection nor to guide or     monitor treatment for     MRSA infections.     Studies: No results found.  Scheduled Meds: . aspirin EC  81 mg Oral Daily  . donepezil  10 mg Oral QHS  . enoxaparin (LOVENOX) injection  40 mg Subcutaneous Q24H  . irbesartan  300 mg Oral Daily  . levETIRAcetam  500 mg Intravenous Q12H  . metoprolol succinate  12.5 mg Oral Daily  . QUEtiapine  12.5 mg Oral QHS  . sodium chloride  3 mL Intravenous Q12H   Continuous Infusions:   Active Problems:   Mixed hyperlipidemia   Essential hypertension, benign   CORONARY ATHEROSCLEROSIS, NATIVE VESSEL   Dementia   Seizure   Elevated troponin   LV (left ventricular) mural thrombus    Time spent: > 45 minutes    Penny Pia  [facility] Pager [phone] If 7PM-7AM, please contact night-coverage at [email or web address], password [facility] 06/10/2012, 3:46 PM  LOS: 2 days

## 2012-06-10 NOTE — Progress Notes (Addendum)
Subjective: No recurrence of seizure activity reported. Patient has required sedation because of spells of agitation.  Objective: Current vital signs: BP 154/66  Pulse 94  Temp(Src) 97.3 F (36.3 C) (Oral)  Resp 18  Ht 5\' 9"  (1.753 m)  Wt 62.4 kg (137 lb 9.1 oz)  BMI 20.31 kg/m2  SpO2 98%  Neurologic Exam: Patient is markedly sedated and could not be aroused. He received 2 mg of Ativan earlier this morning for marked agitation. He is breathing normally and appears to be in no distress.  Lab Results: Results for orders placed during the hospital encounter of 06/08/12 (from the past 48 hour(s))  URINALYSIS, ROUTINE W REFLEX MICROSCOPIC     Status: Abnormal   Collection Time    06/08/12 10:47 AM      Result Value Range   Color, Urine YELLOW  YELLOW   APPearance HAZY (*) CLEAR   Specific Gravity, Urine 1.013  1.005 - 1.030   pH 6.0  5.0 - 8.0   Glucose, UA 250 (*) NEGATIVE mg/dL   Hgb urine dipstick MODERATE (*) NEGATIVE   Bilirubin Urine NEGATIVE  NEGATIVE   Ketones, ur NEGATIVE  NEGATIVE mg/dL   Protein, ur 213 (*) NEGATIVE mg/dL   Urobilinogen, UA 0.2  0.0 - 1.0 mg/dL   Nitrite NEGATIVE  NEGATIVE   Leukocytes, UA NEGATIVE  NEGATIVE  URINE MICROSCOPIC-ADD ON     Status: None   Collection Time    06/08/12 10:47 AM      Result Value Range   Squamous Epithelial / LPF RARE  RARE   WBC, UA 0-2  <3 WBC/hpf   RBC / HPF 3-6  <3 RBC/hpf   Bacteria, UA RARE  RARE   Urine-Other SPERM PRESENT    TROPONIN I     Status: Abnormal   Collection Time    06/08/12 12:06 PM      Result Value Range   Troponin I 1.09 (*) <0.30 ng/mL   Comment:            Due to the release kinetics of cTnI,     a negative result within the first hours     of the onset of symptoms does not rule out     myocardial infarction with certainty.     If myocardial infarction is still suspected,     repeat the test at appropriate intervals.     CRITICAL RESULT CALLED TO, READ BACK BY AND VERIFIED WITH:     K.  ADKINS RN. @1322  ON 06/08/12 BY SASSUD/BARRK MT  LIPID PANEL     Status: None   Collection Time    06/08/12 12:06 PM      Result Value Range   Cholesterol 146  0 - 200 mg/dL   Triglycerides 51  <086 mg/dL   HDL 65  >57 mg/dL   Total CHOL/HDL Ratio 2.2     VLDL 10  0 - 40 mg/dL   LDL Cholesterol 71  0 - 99 mg/dL   Comment:            Total Cholesterol/HDL:CHD Risk     Coronary Heart Disease Risk Table                         Men   Women      1/2 Average Risk   3.4   3.3      Average Risk       5.0   4.4  2 X Average Risk   9.6   7.1      3 X Average Risk  23.4   11.0                Use the calculated Patient Ratio     above and the CHD Risk Table     to determine the patient's CHD Risk.                ATP III CLASSIFICATION (LDL):      <100     mg/dL   Optimal      425-956  mg/dL   Near or Above                        Optimal      130-159  mg/dL   Borderline      387-564  mg/dL   High      >332     mg/dL   Very High  TROPONIN I     Status: Abnormal   Collection Time    06/08/12  6:32 PM      Result Value Range   Troponin I 1.77 (*) <0.30 ng/mL   Comment:            Due to the release kinetics of cTnI,     a negative result within the first hours     of the onset of symptoms does not rule out     myocardial infarction with certainty.     If myocardial infarction is still suspected,     repeat the test at appropriate intervals.     CRITICAL VALUE NOTED.  VALUE IS CONSISTENT WITH PREVIOUSLY REPORTED AND CALLED VALUE.  CBC     Status: Abnormal   Collection Time    06/08/12  6:32 PM      Result Value Range   WBC 11.8 (*) 4.0 - 10.5 K/uL   RBC 3.72 (*) 4.22 - 5.81 MIL/uL   Hemoglobin 13.0  13.0 - 17.0 g/dL   HCT 95.1 (*) 88.4 - 16.6 %   MCV 97.3  78.0 - 100.0 fL   MCH 34.9 (*) 26.0 - 34.0 pg   MCHC 35.9  30.0 - 36.0 g/dL   RDW 06.3  01.6 - 01.0 %   Platelets 262  150 - 400 K/uL  CREATININE, SERUM     Status: Abnormal   Collection Time    06/08/12  6:32 PM       Result Value Range   Creatinine, Ser 0.81  0.50 - 1.35 mg/dL   GFR calc non Af Amer 79 (*) >90 mL/min   GFR calc Af Amer >90  >90 mL/min   Comment:            The eGFR has been calculated     using the CKD EPI equation.     This calculation has not been     validated in all clinical     situations.     eGFR's persistently     <90 mL/min signify     possible Chronic Kidney Disease.  MRSA PCR SCREENING     Status: None   Collection Time    06/08/12  8:00 PM      Result Value Range   MRSA by PCR NEGATIVE  NEGATIVE   Comment:            The GeneXpert MRSA Assay (FDA     approved for NASAL specimens  only), is one component of a     comprehensive MRSA colonization     surveillance program. It is not     intended to diagnose MRSA     infection nor to guide or     monitor treatment for     MRSA infections.  TROPONIN I     Status: Abnormal   Collection Time    06/08/12 10:35 PM      Result Value Range   Troponin I 1.66 (*) <0.30 ng/mL   Comment:            Due to the release kinetics of cTnI,     a negative result within the first hours     of the onset of symptoms does not rule out     myocardial infarction with certainty.     If myocardial infarction is still suspected,     repeat the test at appropriate intervals.     CRITICAL VALUE NOTED.  VALUE IS CONSISTENT WITH PREVIOUSLY REPORTED AND CALLED VALUE.  BASIC METABOLIC PANEL     Status: Abnormal   Collection Time    06/09/12  6:28 AM      Result Value Range   Sodium 128 (*) 135 - 145 mEq/L   Potassium 3.7  3.5 - 5.1 mEq/L   Chloride 93 (*) 96 - 112 mEq/L   CO2 26  19 - 32 mEq/L   Glucose, Bld 98  70 - 99 mg/dL   BUN 13  6 - 23 mg/dL   Creatinine, Ser 5.40  0.50 - 1.35 mg/dL   Calcium 9.2  8.4 - 98.1 mg/dL   GFR calc non Af Amer 74 (*) >90 mL/min   GFR calc Af Amer 86 (*) >90 mL/min   Comment:            The eGFR has been calculated     using the CKD EPI equation.     This calculation has not been     validated  in all clinical     situations.     eGFR's persistently     <90 mL/min signify     possible Chronic Kidney Disease.  CBC     Status: Abnormal   Collection Time    06/09/12  6:28 AM      Result Value Range   WBC 8.9  4.0 - 10.5 K/uL   RBC 3.52 (*) 4.22 - 5.81 MIL/uL   Hemoglobin 12.1 (*) 13.0 - 17.0 g/dL   HCT 19.1 (*) 47.8 - 29.5 %   MCV 98.0  78.0 - 100.0 fL   MCH 34.4 (*) 26.0 - 34.0 pg   MCHC 35.1  30.0 - 36.0 g/dL   RDW 62.1  30.8 - 65.7 %   Platelets 253  150 - 400 K/uL  RETICULOCYTES     Status: Abnormal   Collection Time    06/09/12 10:57 AM      Result Value Range   Retic Ct Pct 1.3  0.4 - 3.1 %   RBC. 3.50 (*) 4.22 - 5.81 MIL/uL   Retic Count, Manual 45.5  19.0 - 186.0 K/uL  BASIC METABOLIC PANEL     Status: Abnormal   Collection Time    06/10/12  5:30 AM      Result Value Range   Sodium 126 (*) 135 - 145 mEq/L   Potassium 3.6  3.5 - 5.1 mEq/L   Chloride 91 (*) 96 - 112 mEq/L   CO2 24  19 -  32 mEq/L   Glucose, Bld 93  70 - 99 mg/dL   BUN 12  6 - 23 mg/dL   Creatinine, Ser 4.09  0.50 - 1.35 mg/dL   Calcium 8.7  8.4 - 81.1 mg/dL   GFR calc non Af Amer 76 (*) >90 mL/min   GFR calc Af Amer 88 (*) >90 mL/min   Comment:            The eGFR has been calculated     using the CKD EPI equation.     This calculation has not been     validated in all clinical     situations.     eGFR's persistently     <90 mL/min signify     possible Chronic Kidney Disease.  CBC     Status: Abnormal   Collection Time    06/10/12  5:30 AM      Result Value Range   WBC 9.6  4.0 - 10.5 K/uL   RBC 3.56 (*) 4.22 - 5.81 MIL/uL   Hemoglobin 12.4 (*) 13.0 - 17.0 g/dL   HCT 91.4 (*) 78.2 - 95.6 %   MCV 98.3  78.0 - 100.0 fL   MCH 34.8 (*) 26.0 - 34.0 pg   MCHC 35.4  30.0 - 36.0 g/dL   RDW 21.3  08.6 - 57.8 %   Platelets 232  150 - 400 K/uL    Studies/Results: Mr Brain Wo Contrast  06/08/2012  *RADIOLOGY REPORT*  Clinical Data: Acute onset of confusion.  Expressive aphasia.  MRI  HEAD WITHOUT CONTRAST  Technique:  Multiplanar, multiecho pulse sequences of the brain and surrounding structures were obtained according to standard protocol without intravenous contrast.  Comparison: 06/08/2012 CT.  09/28/2011 MR.  Findings: Motion degraded exam.  No acute infarct.  No intracranial hemorrhage.  Moderate global atrophy.  Ventricular prominence may be related to atrophy although difficult to completely exclude mild component hydrocephalus.  Moderate small vessel disease type changes.  No intracranial mass lesion detected on this unenhanced exam.  Major intracranial vascular structures are patent.  Atherosclerotic type changes with narrowing left vertebral artery suspected.  Transverse ligament hypertrophy.  Prominent arachnoid granulations incidentally noted in the occipital bone.  IMPRESSION: No acute infarct.  Please see above.   Original Report Authenticated By: Lacy Duverney, M.D.    Dg Chest Port 1 View  06/08/2012  *RADIOLOGY REPORT*  Clinical Data: Altered mental status  PORTABLE CHEST - 1 VIEW  Comparison: 03/05/2008  Findings: Cardiomediastinal silhouette is stable.  There is poor inspiration.  No acute infiltrate or pulmonary edema.  Mild basilar atelectasis.  Degenerative changes bilateral shoulders.  IMPRESSION: Poor inspiration.  No acute infiltrate or pulmonary edema.  Mild basilar atelectasis.   Original Report Authenticated By: Natasha Mead, M.D.     Medications:  I have reviewed the patient's current medications. Scheduled: . aspirin EC  81 mg Oral Daily  . donepezil  10 mg Oral QHS  . enoxaparin (LOVENOX) injection  30 mg Subcutaneous Q24H  . irbesartan  300 mg Oral Daily  . levETIRAcetam  500 mg Intravenous Q12H  . LORazepam      . metoprolol succinate  12.5 mg Oral Daily  . QUEtiapine  12.5 mg Oral QHS  . sodium chloride  3 mL Intravenous Q12H   ION:[id number], ondansetron (ZOFRAN) IV, ondansetron  Assessment/Plan: New-onset seizure disorder, likely a  manifestation of progressive dementia. EEG was unremarkable except for generalized slowing. MRI showed no signs of acute intracranial abnormality, including  no signs of acute stroke.  Recommend continuing Keppra 500 mg twice a day for anticonvulsant management. I have ordered Ativan 1 mg every 4 hours when necessary agitation. Patient takes Xanax at home which he tolerates well, and may resume on discharge.  No objection to discharging patient to home at this point. I will sign off on his care. He will need outpatient neurology followup for management of seizure disorder as well as dementia.  C.R. Roseanne Reno, MD [facility] [phone]  06/10/2012  10:42 AM

## 2012-06-10 NOTE — Progress Notes (Signed)
   CARE MANAGEMENT NOTE 06/10/2012  Patient:  Austin Gallagher, Austin Gallagher   Account Number:  [id number]  Date Initiated:  06/10/2012  Documentation initiated by:  [facility]  Subjective/Objective Assessment:   dementia, seizures     Action/Plan:   lives at home with wife, Austin Gallagher   Anticipated DC Date:  06/10/2012   Anticipated DC Plan:  HOME W HOME HEALTH SERVICES      DC Planning Services  CM consult      [facility] Choice  HOME HEALTH   Choice offered to / List presented to:  C-3 Spouse        HH arranged  HH-1 RN  HH-2 PT      [facility] agency  [employer]   Status of service:  Completed, signed off Medicare Important Message given?   (If response is "NO", the following Medicare IM given date fields will be blank) Date Medicare IM given:   Date Additional Medicare IM given:    Discharge Disposition:  HOME W HOME HEALTH SERVICES  Per UR Regulation:    If discussed at Long Length of Stay Meetings, dates discussed:    Comments:  06/10/2012 1500 NCM spoke to pt's wife, Austin Gallagher. States they just moved to their new home. They have a power outage at this time and things are not set up at home. She is arranging temp housing for dc on 3/10. They will go home with dtr or friends. She is requesting Austin Gallagher for [facility]. They had [employer] in the past. No DME is needed. Provided wife with private duty agency list. Austin Gallagher with new referral. Will fax to [place] intake for scheduled dc tomorrow. Austin Donning RN CCM Case Mgmt phone [phone]

## 2012-06-10 NOTE — Progress Notes (Signed)
Pt morning Qtc 0.62 (previous 0.44). Pt received one PRN dose of haldol at shift change last night. Paged MD on-call. Order given to DC prn haldol. Will continue to monitor.  Harless Litten, RN 06/10/12

## 2012-06-10 NOTE — Progress Notes (Signed)
Pt in NSR with intermittent Junctional rhythm. Pt VS stable, no chest pain. MD on-call paged and notified. Will continue to monitor.  Harless Litten, RN 06/10/12

## 2012-06-10 NOTE — Progress Notes (Signed)
PT Cancellation Note  Patient Details Name: Austin Gallagher MRN: [id number] DOB: 08-12-1925   Cancelled Treatment:    Reason Eval/Treat Not Completed: Medical issues which prohibited therapy;Patient's level of consciousness.  Patient agitated and received Ativan.  RN to contact PT if family comes in today.  Will return at later time for PT evaluation.   Vena Austria 06/10/2012, 3:52 PM [phone]

## 2012-06-11 DIAGNOSIS — R569 Unspecified convulsions: Secondary | ICD-10-CM | POA: Diagnosis not present

## 2012-06-11 DIAGNOSIS — I2510 Atherosclerotic heart disease of native coronary artery without angina pectoris: Secondary | ICD-10-CM | POA: Diagnosis not present

## 2012-06-11 DIAGNOSIS — R4701 Aphasia: Secondary | ICD-10-CM | POA: Diagnosis not present

## 2012-06-11 DIAGNOSIS — F0390 Unspecified dementia without behavioral disturbance: Secondary | ICD-10-CM | POA: Diagnosis not present

## 2012-06-11 LAB — IRON AND TIBC
Iron: 84 ug/dL (ref 42–135)
Saturation Ratios: 34 % (ref 20–55)
TIBC: 250 ug/dL (ref 215–435)
UIBC: 166 ug/dL (ref 125–400)

## 2012-06-11 LAB — BASIC METABOLIC PANEL
CO2: 27 mEq/L (ref 19–32)
Calcium: 9.2 mg/dL (ref 8.4–10.5)
Creatinine, Ser: 0.79 mg/dL (ref 0.50–1.35)
GFR calc Af Amer: >90 mL/min (ref >90)
GFR calc non Af Amer: 80 mL/min — ABNORMAL LOW (ref >90)
Sodium: 132 mEq/L — ABNORMAL LOW (ref 135–145)

## 2012-06-11 MED ORDER — LEVETIRACETAM 500 MG PO TABS
500.0000 mg | ORAL_TABLET | Freq: Two times a day (BID) | ORAL | Status: DC
  Filled 2012-06-11: qty 1

## 2012-06-11 MED ORDER — LEVETIRACETAM 500 MG PO TABS: 500.0000 mg | ORAL_TABLET | Freq: Two times a day (BID) | ORAL | Status: DC

## 2012-06-11 NOTE — Progress Notes (Signed)
PT Cancellation Note  Patient Details Name: Austin Gallagher MRN: [id number] DOB: Mar 30, 1926   Cancelled Treatment:    Reason Eval/Treat Not Completed: Other (comment) (Patient being discharged).  Patient being discharged - wife declined PT eval.  OT had just completed eval - patient min guard for ambulation.  Wife is used to assisting patient with ambulation - needs close guarding.  Ready for discharge.  HHPT arranged.   Vena Austria 06/11/2012, 1:53 PM

## 2012-06-11 NOTE — Progress Notes (Signed)
Notified by CCMD pt is in and out of Junctional/Junctional Tachy.  Pt is asymptomatic--no complaints voiced. Per Report pt as a history of doing this--this admission.  Will continue to monitor.  CCMD saved strips to be viewed is needed. Dierdre Highman, RN

## 2012-06-11 NOTE — Discharge Summary (Signed)
Physician Discharge Summary  Patient ID: BUCKLEY BRADLY MRN: [id number] DOB/AGE: Mar 06, 1926 79 y.o.  Admit date: 06/08/2012 Discharge date: 06/11/2012  Primary Care Physician:  Rene Paci, MD  Discharge Diagnoses:    . CORONARY ATHEROSCLEROSIS, NATIVE VESSEL . Essential hypertension, benign . Seizure- NEW placed on Keppra . Dementia . Mixed hyperlipidemia . LV (left ventricular) mural thrombus Elevated troponin  Consults: Cardiology, Dr. Gala Romney                    Neurology, Dr. Roseanne Reno     Discharge Medications:   Medication List    TAKE these medications       alendronate 70 MG tablet  Commonly known as:  FOSAMAX  Take 1 tablet by mouth Daily.     aspirin 81 MG tablet  Take 81 mg by mouth daily.     donepezil 10 MG tablet  Commonly known as:  ARICEPT  Take 10 mg by mouth at bedtime as needed (Dementia).     levETIRAcetam 500 MG tablet  Commonly known as:  KEPPRA  Take 1 tablet (500 mg total) by mouth 2 (two) times daily.     metoprolol succinate 25 MG 24 hr tablet  Commonly known as:  TOPROL-XL  TAKE 1/2 TABLET daily     mupirocin ointment 2 %  Commonly known as:  BACTROBAN  Apply topically 3 (three) times daily.     valsartan-hydrochlorothiazide 320-12.5 MG per tablet  Commonly known as:  DIOVAN-HCT  Take 1 tablet by mouth daily.         Brief H and P: For complete details please refer to admission H and P, but in brief patient is 79 year old male with past medical history significant for coronary disease, hyperlipidemia, hypertension, mild dementia who was in his usual state of health until the morning of admission, around 8 AM, patient was noticed to have altered mental status per his wife. Patient was apparently sitting eating breakfast when she heard a noise, he was slumped down, moaning and then started to shake over. She bit his home and possibly had a urinary incontinence. Patient was post ictal. He also had expressive aphasia on  presentation. Code stroke was initially called and subsequently canceled by neurology. Patient was unable to provide any history due to aphasia. Patient was also noticed to have elevated troponin and cardiology was consulted in ED.  Hospital Course:  79 y.o. male with PMH significant for CAD, Hyperlipidemia, HTN, mild dementia who presented on the morning of admission with a seizure episode. Patient was also noticed to have increase troponin during his initial w/u. Cardiology was consulted. He was initially admitted to step down unit. Seizure: Patient was loaded with IV Keppra. CT head was negative for acute findings, MRI did not show any acute stroke except moderate global atrophy. EEG showed mild generalized nonspecific slowing of cerebral activity, no evidence of epileptic disorder was demonstrated. The patient was subsequently placed on Keppra 500mg  BID and did not have any repeat seizure episode during the hospitalization. He will followup with his primary neurologist Dr. Jamelle Rushing in 2 weeks.   Aphasia  CT head w/o acute findings at time of presentation - likely a feature related to his seizure and postictal state, symptoms have resolved and patient is currently at his baseline status and conversing, with family at bedside. He is cleared for D3 diet w/ thin liquids per SLP   Hyponatremia  Renal function is normal, appears to have a baseline Na of 127-133.  He should have renal function checked in at the next labs at followup appointment. Currently Na 132, if Na trending is low, may DC HCTZ out-patient.   Elevated troponin w/ hx of CAD s/p PTCA/stent RCA 2005: Cardiology was consulted who felt that the elevated troponin is related to the neurology event and not primarily cardiac. However cannot exclude a demand ischemia or precipitating arrhythmia. Patient did not have any chest pain or acute shortness of breath, ECG was normal. Cont ASA and BP control.   LV apical thrombus  2-D  echocardiogram did show apical mural thrombus, per cardiology no further cardiac workup is required, typically will treat mural thrombus with 3 months of Coumadin but given dementia, fall risk and seizures, he is not a candidate for anticoagulation. Recommended to continue aspirin only.   Dementia : The patient did have episodes of agitation and sundowning during the hospitalization however currently at baseline at the time of discharge.  HTN  Reasonably controlled at this time     Day of Discharge BP 136/80  Pulse 84  Temp(Src) 97.5 F (36.4 C) (Oral)  Resp 16  Ht 5\' 9"  (1.753 m)  Wt 62.4 kg (137 lb 9.1 oz)  BMI 20.31 kg/m2  SpO2 95%  Physical Exam: General: Alert and awake oriented x3 not in any acute distress. HEENT: anicteric sclera, pupils reactive to light and accommodation CVS: S1-S2 clear no murmur rubs or gallops Chest: clear to auscultation bilaterally, no wheezing rales or rhonchi Abdomen: soft nontender, nondistended, normal bowel sounds, no organomegaly Extremities: no cyanosis, clubbing or edema noted bilaterally Neuro: Cranial nerves II-XII intact, no focal neurological deficits   The results of significant diagnostics from this hospitalization (including imaging, microbiology, ancillary and laboratory) are listed below for reference.    LAB RESULTS: Basic Metabolic Panel:  Recent Labs Lab 06/10/12 0530 06/11/12 1050  NA 126* 132*  K 3.6 3.8  CL 91* 96  CO2 24 27  GLUCOSE 93 122*  BUN 12 11  CREATININE 0.89 0.79  CALCIUM 8.7 9.2   Liver Function Tests:  Recent Labs Lab 06/08/12 1023  AST 26  ALT 19  ALKPHOS 44  BILITOT 0.4  PROT 6.8  ALBUMIN 3.9   CBC:  Recent Labs Lab 06/08/12 1023  06/09/12 0628 06/10/12 0530  WBC 13.0*  < > 8.9 9.6  NEUTROABS 10.6*  --   --   --   HGB 13.1  < > 12.1* 12.4*  HCT 36.3*  < > 34.5* 35.0*  MCV 97.6  < > 98.0 98.3  PLT 242  < > 253 232  < > = values in this interval not displayed. Cardiac  Enzymes:  Recent Labs Lab 06/08/12 1832 06/08/12 2235  TROPONINI 1.77* 1.66*   BNP: No components found with this basename: POCBNP,  CBG:  Recent Labs Lab 06/08/12 1023  GLUCAP 136*    Significant Diagnostic Studies:  Ct Head Wo Contrast  06/08/2012  *RADIOLOGY REPORT*  Clinical Data: Altered mental status  CT HEAD WITHOUT CONTRAST  Technique:  Contiguous axial images were obtained from the base of the skull through the vertex without contrast.  Comparison: 09/28/2011  Findings: Chronic ischemic changes.  Global atrophy.  No mass effect, midline shift, or acute intracranial hemorrhage.  A right MCA branch within the right sylvian fissure is hyperdense on image 17.  Intravascular thrombus cannot be excluded.  IMPRESSION: Hyperdense right MCA branch as described.  Vessel thrombus cannot be excluded.  Chronic ischemic changes and global atrophy are  noted. Critical Value/emergent results were called by telephone at the time of interpretation on 06/08/2012 at 1050 hours to Dr. Rubin Payor, who verbally acknowledged these results.   Original Report Authenticated By: Jolaine Click, M.D.    Mr Brain Wo Contrast  06/08/2012  *RADIOLOGY REPORT*  Clinical Data: Acute onset of confusion.  Expressive aphasia.  MRI HEAD WITHOUT CONTRAST  Technique:  Multiplanar, multiecho pulse sequences of the brain and surrounding structures were obtained according to standard protocol without intravenous contrast.  Comparison: 06/08/2012 CT.  09/28/2011 MR.  Findings: Motion degraded exam.  No acute infarct.  No intracranial hemorrhage.  Moderate global atrophy.  Ventricular prominence may be related to atrophy although difficult to completely exclude mild component hydrocephalus.  Moderate small vessel disease type changes.  No intracranial mass lesion detected on this unenhanced exam.  Major intracranial vascular structures are patent.  Atherosclerotic type changes with narrowing left vertebral artery suspected.  Transverse  ligament hypertrophy.  Prominent arachnoid granulations incidentally noted in the occipital bone.  IMPRESSION: No acute infarct.  Please see above.   Original Report Authenticated By: Lacy Duverney, M.D.    Dg Chest Port 1 View  06/08/2012  *RADIOLOGY REPORT*  Clinical Data: Altered mental status  PORTABLE CHEST - 1 VIEW  Comparison: 03/05/2008  Findings: Cardiomediastinal silhouette is stable.  There is poor inspiration.  No acute infiltrate or pulmonary edema.  Mild basilar atelectasis.  Degenerative changes bilateral shoulders.  IMPRESSION: Poor inspiration.  No acute infiltrate or pulmonary edema.  Mild basilar atelectasis.   Original Report Authenticated By: Natasha Mead, M.D.     2D ECHO:   Disposition and Follow-up:     Discharge Orders   Future Orders Complete By Expires     Diet - low sodium heart healthy  As directed     Discharge instructions  As directed     Comments:      FALL PRECAUTIONS    Increase activity slowly  As directed         DISPOSITION: Home, home health services were arranged  DIET: Heart healthy diet, dysphagia 3  ACTIVITY: As tolerated    DISCHARGE FOLLOW-UP Follow-up Information   Follow up with [facility]. (Home Health RN and Physical Therapy)    Contact information:   [phone]      Follow up with Rene Paci, MD. Schedule an appointment as soon as possible for a visit in 10 days. (for hospital follow-up)    Contact information:   [address] [place] [address] [phone]       Follow up with Joycelyn Schmid, MD. Schedule an appointment as soon as possible for a visit in 2 weeks. (for hospital follow-up)    Contact information:   [address], [address] [address] [facility] [place] [address] [phone]       Time spent on Discharge: 35 mins Signed:   RAI,RIPUDEEP M.D. [facility] 06/11/2012, 2:04 PM Pager: [phone]

## 2012-06-11 NOTE — Evaluation (Signed)
Occupational Therapy Evaluation Patient Details Name: Austin Gallagher MRN: [id number] DOB: 05-27-1925 Today's Date: 06/11/2012 Time: 2952-8413 OT Time Calculation (min): 17 min  OT Assessment / Plan / Recommendation Clinical Impression  Pleasantly confused 79 yr old male admitted secondary to seizure.  Presents at an overall min guard to supervision level for simulated selfcare tasks and functional transfers.  Wife can provide 24 hour supervision at discharge.  No further acute or post acute OT needs.  Recommend direct 24 hour supervision currently secondary to decreased dynamic balance issues.    OT Assessment  Patient does not need any further OT services    Follow Up Recommendations  No OT follow up       Equipment Recommendations  None recommended by OT          Precautions / Restrictions Precautions Precautions: Fall Restrictions Weight Bearing Restrictions: No   Pertinent Vitals/Pain Vitals stable    ADL  Eating/Feeding: Simulated;Set up Where Assessed - Eating/Feeding: Edge of bed Grooming: Simulated;Min guard Where Assessed - Grooming: Unsupported standing Upper Body Bathing: Simulated;Set up Where Assessed - Upper Body Bathing: Unsupported sitting Lower Body Bathing: Simulated;Min guard Where Assessed - Lower Body Bathing: Unsupported sit to stand Upper Body Dressing: Performed;Minimal assistance Where Assessed - Upper Body Dressing: Unsupported sitting Lower Body Dressing: Simulated;Min guard Where Assessed - Lower Body Dressing: Supported sit to stand Toilet Transfer: Simulated;Min guard Toilet Transfer Method: Other (comment) (Ambulating without assistive device) Acupuncturist: Other (comment) (To bedside chair.) Toileting - Clothing Manipulation and Hygiene: Simulated;Min guard Where Assessed - Glass blower/designer Manipulation and Hygiene: Other (comment) (sit to stand from bedsdie chair) Tub/Shower Transfer Method: Not assessed Equipment Used:  Gait belt Transfers/Ambulation Related to ADLs: Pt is overall min guard assist for mobility without use of assistive device.  Pt with occasional wobbling but did not need assistance to regain her balance. ADL Comments: Pt needs constant supervision for safety and to sequence all bathing and dressing tasks secondary to his history of dementia.  Discussed use of routine to help with pt at home as well as writing things down.   Pt's wife reports using a whiteboard for him to recognize the date.        Visit Information  Last OT Received On: 06/11/12 Assistance Needed: +1    Subjective Data  Subjective: I have to tell him everything to do at home. (wife) Patient Stated Goal: Pt wants to go home.   Prior Functioning     Home Living Lives With: Spouse Available Help at Discharge: Family;Available 24 hours/day Type of Home: House Home Access: Stairs to enter [employer] of Steps: 1 Home Layout: One level Bathroom Shower/Tub: Health visitor: Handicapped height Bathroom Accessibility: Yes Home Adaptive Equipment: Grab bars around toilet;Grab bars in shower;Shower chair with back Prior Function Level of Independence: Needs assistance Needs Assistance: Bathing;Dressing;Grooming;Toileting Bath: Supervision/set-up Dressing: Supervision/set-up Grooming: Supervision/set-up Toileting: Supervision/set-up Driving: No Vocation: Retired Musician: No difficulties         Vision/Perception Vision - History Baseline Vision: No visual deficits Patient Visual Report: No change from baseline Vision - Assessment Vision Assessment: Vision not tested Perception Perception: Within Functional Limits Praxis Praxis: Intact   Cognition  Cognition Overall Cognitive Status: History of cognitive impairments - at baseline Arousal/Alertness: Awake/alert Orientation Level: Disoriented to;Time;Situation Behavior During Session: [facility] for tasks performed     Extremity/Trunk Assessment Right Upper Extremity Assessment RUE ROM/Strength/Tone: Within functional levels RUE Sensation: WFL - Light Touch RUE Coordination: WFL -  gross/fine motor Left Upper Extremity Assessment LUE ROM/Strength/Tone: Within functional levels LUE Sensation: WFL - Light Touch LUE Coordination: WFL - gross/fine motor Trunk Assessment Trunk Assessment: Normal     Mobility Transfers Transfers: Sit to Stand;Stand to Sit Sit to Stand: 5: Supervision;With upper extremity assist;From chair/3-in-1 Stand to Sit: 5: Supervision;With upper extremity assist;To chair/3-in-1        Balance Balance Balance Assessed: Yes Dynamic Standing Balance Dynamic Standing - Balance Support: No upper extremity supported Dynamic Standing - Level of Assistance: 4: Min assist   End of Session OT - End of Session Equipment Utilized During Treatment: Gait belt Activity Tolerance: Patient tolerated treatment well Patient left: in chair;with call bell/phone within reach;with family/visitor present Nurse Communication: Mobility status     MCGUIRE,JAMES OTR/L Pager number [phone] 06/11/2012, 1:43 PM

## 2012-06-12 DIAGNOSIS — F0391 Unspecified dementia with behavioral disturbance: Secondary | ICD-10-CM | POA: Diagnosis not present

## 2012-06-12 DIAGNOSIS — I2510 Atherosclerotic heart disease of native coronary artery without angina pectoris: Secondary | ICD-10-CM | POA: Diagnosis not present

## 2012-06-12 DIAGNOSIS — G40909 Epilepsy, unspecified, not intractable, without status epilepticus: Secondary | ICD-10-CM | POA: Diagnosis not present

## 2012-06-12 DIAGNOSIS — R269 Unspecified abnormalities of gait and mobility: Secondary | ICD-10-CM | POA: Diagnosis not present

## 2012-06-12 DIAGNOSIS — I69920 Aphasia following unspecified cerebrovascular disease: Secondary | ICD-10-CM | POA: Diagnosis not present

## 2012-06-13 ENCOUNTER — Telehealth: Payer: Self-pay | Admitting: General Practice

## 2012-06-13 DIAGNOSIS — R413 Other amnesia: Secondary | ICD-10-CM | POA: Diagnosis not present

## 2012-06-13 DIAGNOSIS — R269 Unspecified abnormalities of gait and mobility: Secondary | ICD-10-CM | POA: Diagnosis not present

## 2012-06-13 NOTE — Telephone Encounter (Signed)
Attempted transitional care call.  Spoke with male in the home.  Patient is out at a doctor's appointment.  Will attempt to reach patient later on this afternoon.

## 2012-06-14 ENCOUNTER — Other Ambulatory Visit: Payer: Self-pay | Admitting: Diagnostic Neuroimaging

## 2012-06-14 DIAGNOSIS — G40309 Generalized idiopathic epilepsy and epileptic syndromes, not intractable, without status epilepticus: Secondary | ICD-10-CM | POA: Diagnosis not present

## 2012-06-15 DIAGNOSIS — R269 Unspecified abnormalities of gait and mobility: Secondary | ICD-10-CM | POA: Diagnosis not present

## 2012-06-15 DIAGNOSIS — G40909 Epilepsy, unspecified, not intractable, without status epilepticus: Secondary | ICD-10-CM | POA: Diagnosis not present

## 2012-06-15 DIAGNOSIS — F0391 Unspecified dementia with behavioral disturbance: Secondary | ICD-10-CM | POA: Diagnosis not present

## 2012-06-15 DIAGNOSIS — I69920 Aphasia following unspecified cerebrovascular disease: Secondary | ICD-10-CM | POA: Diagnosis not present

## 2012-06-15 DIAGNOSIS — I2510 Atherosclerotic heart disease of native coronary artery without angina pectoris: Secondary | ICD-10-CM | POA: Diagnosis not present

## 2012-06-19 ENCOUNTER — Telehealth: Payer: Self-pay | Admitting: General Practice

## 2012-06-19 DIAGNOSIS — F0391 Unspecified dementia with behavioral disturbance: Secondary | ICD-10-CM | POA: Diagnosis not present

## 2012-06-19 DIAGNOSIS — R269 Unspecified abnormalities of gait and mobility: Secondary | ICD-10-CM | POA: Diagnosis not present

## 2012-06-19 DIAGNOSIS — I2510 Atherosclerotic heart disease of native coronary artery without angina pectoris: Secondary | ICD-10-CM | POA: Diagnosis not present

## 2012-06-19 DIAGNOSIS — G40909 Epilepsy, unspecified, not intractable, without status epilepticus: Secondary | ICD-10-CM | POA: Diagnosis not present

## 2012-06-19 DIAGNOSIS — I69920 Aphasia following unspecified cerebrovascular disease: Secondary | ICD-10-CM | POA: Diagnosis not present

## 2012-06-19 NOTE — Telephone Encounter (Signed)
Attempt at transitional care call.  LMOM

## 2012-06-19 NOTE — Telephone Encounter (Signed)
Spoke with patient's daughter.  Daughter states that patient's PCP is Dr. Ronne Binning with [facility].

## 2012-06-21 DIAGNOSIS — I2510 Atherosclerotic heart disease of native coronary artery without angina pectoris: Secondary | ICD-10-CM | POA: Diagnosis not present

## 2012-06-21 DIAGNOSIS — G40909 Epilepsy, unspecified, not intractable, without status epilepticus: Secondary | ICD-10-CM | POA: Diagnosis not present

## 2012-06-21 DIAGNOSIS — F0391 Unspecified dementia with behavioral disturbance: Secondary | ICD-10-CM | POA: Diagnosis not present

## 2012-06-21 DIAGNOSIS — I69920 Aphasia following unspecified cerebrovascular disease: Secondary | ICD-10-CM | POA: Diagnosis not present

## 2012-06-21 DIAGNOSIS — R269 Unspecified abnormalities of gait and mobility: Secondary | ICD-10-CM | POA: Diagnosis not present

## 2012-06-26 DIAGNOSIS — R269 Unspecified abnormalities of gait and mobility: Secondary | ICD-10-CM | POA: Diagnosis not present

## 2012-06-26 DIAGNOSIS — F0391 Unspecified dementia with behavioral disturbance: Secondary | ICD-10-CM | POA: Diagnosis not present

## 2012-06-26 DIAGNOSIS — I69920 Aphasia following unspecified cerebrovascular disease: Secondary | ICD-10-CM | POA: Diagnosis not present

## 2012-06-26 DIAGNOSIS — I2510 Atherosclerotic heart disease of native coronary artery without angina pectoris: Secondary | ICD-10-CM | POA: Diagnosis not present

## 2012-06-26 DIAGNOSIS — G40909 Epilepsy, unspecified, not intractable, without status epilepticus: Secondary | ICD-10-CM | POA: Diagnosis not present

## 2012-06-28 DIAGNOSIS — I69920 Aphasia following unspecified cerebrovascular disease: Secondary | ICD-10-CM | POA: Diagnosis not present

## 2012-06-28 DIAGNOSIS — F0391 Unspecified dementia with behavioral disturbance: Secondary | ICD-10-CM | POA: Diagnosis not present

## 2012-06-28 DIAGNOSIS — I2510 Atherosclerotic heart disease of native coronary artery without angina pectoris: Secondary | ICD-10-CM | POA: Diagnosis not present

## 2012-06-28 DIAGNOSIS — R269 Unspecified abnormalities of gait and mobility: Secondary | ICD-10-CM | POA: Diagnosis not present

## 2012-06-28 DIAGNOSIS — G40909 Epilepsy, unspecified, not intractable, without status epilepticus: Secondary | ICD-10-CM | POA: Diagnosis not present

## 2012-06-30 DIAGNOSIS — G40909 Epilepsy, unspecified, not intractable, without status epilepticus: Secondary | ICD-10-CM | POA: Diagnosis not present

## 2012-06-30 DIAGNOSIS — F0391 Unspecified dementia with behavioral disturbance: Secondary | ICD-10-CM | POA: Diagnosis not present

## 2012-06-30 DIAGNOSIS — I2510 Atherosclerotic heart disease of native coronary artery without angina pectoris: Secondary | ICD-10-CM | POA: Diagnosis not present

## 2012-06-30 DIAGNOSIS — I69920 Aphasia following unspecified cerebrovascular disease: Secondary | ICD-10-CM | POA: Diagnosis not present

## 2012-06-30 DIAGNOSIS — R269 Unspecified abnormalities of gait and mobility: Secondary | ICD-10-CM | POA: Diagnosis not present

## 2012-07-04 ENCOUNTER — Telehealth: Payer: Self-pay

## 2012-07-04 MED ORDER — LEVETIRACETAM 250 MG PO TABS: 250.0000 mg | ORAL_TABLET | Freq: Two times a day (BID) | ORAL | Status: DC

## 2012-07-04 NOTE — Telephone Encounter (Signed)
I will change keppra to 250mg  BID. Sent rx to [facility]. Please let patient and family know. -VRP

## 2012-07-04 NOTE — Telephone Encounter (Signed)
Spouse called and left message saying she feels that the Keppra dose may be too high.  Says since patient has been on this dose he has no energy, it's "knocking him out" and he has fallen once.  She would like to know if either the dose can be decreased or if a new med could be prescribed.  Would like to speak to Provider.  Phone number left was [phone].

## 2012-07-04 NOTE — Telephone Encounter (Signed)
Austin Gallagher spoke with patient.  They are aware of med change.  They will try this and call us back if needed.

## 2012-07-04 NOTE — Addendum Note (Signed)
Addended byJoycelyn Schmid on: 07/04/2012 04:45 PM   Modules accepted: Orders

## 2012-07-04 NOTE — Telephone Encounter (Signed)
Spoke to spouse, sched appt for 07/11/12.

## 2012-07-05 DIAGNOSIS — F0391 Unspecified dementia with behavioral disturbance: Secondary | ICD-10-CM | POA: Diagnosis not present

## 2012-07-05 DIAGNOSIS — I69920 Aphasia following unspecified cerebrovascular disease: Secondary | ICD-10-CM | POA: Diagnosis not present

## 2012-07-05 DIAGNOSIS — G40909 Epilepsy, unspecified, not intractable, without status epilepticus: Secondary | ICD-10-CM | POA: Diagnosis not present

## 2012-07-05 DIAGNOSIS — I2510 Atherosclerotic heart disease of native coronary artery without angina pectoris: Secondary | ICD-10-CM | POA: Diagnosis not present

## 2012-07-05 DIAGNOSIS — R269 Unspecified abnormalities of gait and mobility: Secondary | ICD-10-CM | POA: Diagnosis not present

## 2012-07-06 DIAGNOSIS — I2510 Atherosclerotic heart disease of native coronary artery without angina pectoris: Secondary | ICD-10-CM | POA: Diagnosis not present

## 2012-07-06 DIAGNOSIS — R269 Unspecified abnormalities of gait and mobility: Secondary | ICD-10-CM | POA: Diagnosis not present

## 2012-07-06 DIAGNOSIS — F0391 Unspecified dementia with behavioral disturbance: Secondary | ICD-10-CM | POA: Diagnosis not present

## 2012-07-06 DIAGNOSIS — I69920 Aphasia following unspecified cerebrovascular disease: Secondary | ICD-10-CM | POA: Diagnosis not present

## 2012-07-06 DIAGNOSIS — G40909 Epilepsy, unspecified, not intractable, without status epilepticus: Secondary | ICD-10-CM | POA: Diagnosis not present

## 2012-07-10 DIAGNOSIS — R269 Unspecified abnormalities of gait and mobility: Secondary | ICD-10-CM | POA: Diagnosis not present

## 2012-07-10 DIAGNOSIS — F0391 Unspecified dementia with behavioral disturbance: Secondary | ICD-10-CM | POA: Diagnosis not present

## 2012-07-10 DIAGNOSIS — G40909 Epilepsy, unspecified, not intractable, without status epilepticus: Secondary | ICD-10-CM | POA: Diagnosis not present

## 2012-07-10 DIAGNOSIS — I2510 Atherosclerotic heart disease of native coronary artery without angina pectoris: Secondary | ICD-10-CM | POA: Diagnosis not present

## 2012-07-10 DIAGNOSIS — I69920 Aphasia following unspecified cerebrovascular disease: Secondary | ICD-10-CM | POA: Diagnosis not present

## 2012-07-11 ENCOUNTER — Ambulatory Visit: Payer: Self-pay | Admitting: Diagnostic Neuroimaging

## 2012-07-12 DIAGNOSIS — I2510 Atherosclerotic heart disease of native coronary artery without angina pectoris: Secondary | ICD-10-CM | POA: Diagnosis not present

## 2012-07-12 DIAGNOSIS — F0391 Unspecified dementia with behavioral disturbance: Secondary | ICD-10-CM | POA: Diagnosis not present

## 2012-07-12 DIAGNOSIS — R269 Unspecified abnormalities of gait and mobility: Secondary | ICD-10-CM | POA: Diagnosis not present

## 2012-07-12 DIAGNOSIS — G40909 Epilepsy, unspecified, not intractable, without status epilepticus: Secondary | ICD-10-CM | POA: Diagnosis not present

## 2012-07-12 DIAGNOSIS — I69920 Aphasia following unspecified cerebrovascular disease: Secondary | ICD-10-CM | POA: Diagnosis not present

## 2012-07-17 DIAGNOSIS — R269 Unspecified abnormalities of gait and mobility: Secondary | ICD-10-CM | POA: Diagnosis not present

## 2012-07-17 DIAGNOSIS — G40909 Epilepsy, unspecified, not intractable, without status epilepticus: Secondary | ICD-10-CM | POA: Diagnosis not present

## 2012-07-17 DIAGNOSIS — F0391 Unspecified dementia with behavioral disturbance: Secondary | ICD-10-CM | POA: Diagnosis not present

## 2012-07-17 DIAGNOSIS — I69920 Aphasia following unspecified cerebrovascular disease: Secondary | ICD-10-CM | POA: Diagnosis not present

## 2012-07-17 DIAGNOSIS — I2510 Atherosclerotic heart disease of native coronary artery without angina pectoris: Secondary | ICD-10-CM | POA: Diagnosis not present

## 2012-07-19 DIAGNOSIS — F0391 Unspecified dementia with behavioral disturbance: Secondary | ICD-10-CM | POA: Diagnosis not present

## 2012-07-19 DIAGNOSIS — G40909 Epilepsy, unspecified, not intractable, without status epilepticus: Secondary | ICD-10-CM | POA: Diagnosis not present

## 2012-07-19 DIAGNOSIS — R269 Unspecified abnormalities of gait and mobility: Secondary | ICD-10-CM | POA: Diagnosis not present

## 2012-07-19 DIAGNOSIS — I2510 Atherosclerotic heart disease of native coronary artery without angina pectoris: Secondary | ICD-10-CM | POA: Diagnosis not present

## 2012-07-19 DIAGNOSIS — I69920 Aphasia following unspecified cerebrovascular disease: Secondary | ICD-10-CM | POA: Diagnosis not present

## 2012-07-26 DIAGNOSIS — I69920 Aphasia following unspecified cerebrovascular disease: Secondary | ICD-10-CM | POA: Diagnosis not present

## 2012-07-26 DIAGNOSIS — F0391 Unspecified dementia with behavioral disturbance: Secondary | ICD-10-CM | POA: Diagnosis not present

## 2012-07-26 DIAGNOSIS — R269 Unspecified abnormalities of gait and mobility: Secondary | ICD-10-CM | POA: Diagnosis not present

## 2012-07-26 DIAGNOSIS — G40909 Epilepsy, unspecified, not intractable, without status epilepticus: Secondary | ICD-10-CM | POA: Diagnosis not present

## 2012-07-26 DIAGNOSIS — I2510 Atherosclerotic heart disease of native coronary artery without angina pectoris: Secondary | ICD-10-CM | POA: Diagnosis not present

## 2012-07-27 DIAGNOSIS — G40909 Epilepsy, unspecified, not intractable, without status epilepticus: Secondary | ICD-10-CM | POA: Diagnosis not present

## 2012-07-27 DIAGNOSIS — R269 Unspecified abnormalities of gait and mobility: Secondary | ICD-10-CM | POA: Diagnosis not present

## 2012-07-27 DIAGNOSIS — F0391 Unspecified dementia with behavioral disturbance: Secondary | ICD-10-CM | POA: Diagnosis not present

## 2012-07-27 DIAGNOSIS — I69920 Aphasia following unspecified cerebrovascular disease: Secondary | ICD-10-CM | POA: Diagnosis not present

## 2012-07-27 DIAGNOSIS — I2510 Atherosclerotic heart disease of native coronary artery without angina pectoris: Secondary | ICD-10-CM | POA: Diagnosis not present

## 2012-07-31 DIAGNOSIS — R269 Unspecified abnormalities of gait and mobility: Secondary | ICD-10-CM | POA: Diagnosis not present

## 2012-07-31 DIAGNOSIS — I69920 Aphasia following unspecified cerebrovascular disease: Secondary | ICD-10-CM | POA: Diagnosis not present

## 2012-07-31 DIAGNOSIS — I2510 Atherosclerotic heart disease of native coronary artery without angina pectoris: Secondary | ICD-10-CM | POA: Diagnosis not present

## 2012-07-31 DIAGNOSIS — G40909 Epilepsy, unspecified, not intractable, without status epilepticus: Secondary | ICD-10-CM | POA: Diagnosis not present

## 2012-07-31 DIAGNOSIS — F0391 Unspecified dementia with behavioral disturbance: Secondary | ICD-10-CM | POA: Diagnosis not present

## 2012-08-02 DIAGNOSIS — I69920 Aphasia following unspecified cerebrovascular disease: Secondary | ICD-10-CM | POA: Diagnosis not present

## 2012-08-02 DIAGNOSIS — F0391 Unspecified dementia with behavioral disturbance: Secondary | ICD-10-CM | POA: Diagnosis not present

## 2012-08-02 DIAGNOSIS — I2510 Atherosclerotic heart disease of native coronary artery without angina pectoris: Secondary | ICD-10-CM | POA: Diagnosis not present

## 2012-08-02 DIAGNOSIS — R269 Unspecified abnormalities of gait and mobility: Secondary | ICD-10-CM | POA: Diagnosis not present

## 2012-08-02 DIAGNOSIS — G40909 Epilepsy, unspecified, not intractable, without status epilepticus: Secondary | ICD-10-CM | POA: Diagnosis not present

## 2012-08-07 ENCOUNTER — Telehealth: Payer: Self-pay

## 2012-08-07 ENCOUNTER — Telehealth: Payer: Self-pay | Admitting: Diagnostic Neuroimaging

## 2012-08-07 DIAGNOSIS — I2510 Atherosclerotic heart disease of native coronary artery without angina pectoris: Secondary | ICD-10-CM | POA: Diagnosis not present

## 2012-08-07 DIAGNOSIS — R269 Unspecified abnormalities of gait and mobility: Secondary | ICD-10-CM | POA: Diagnosis not present

## 2012-08-07 DIAGNOSIS — F0391 Unspecified dementia with behavioral disturbance: Secondary | ICD-10-CM | POA: Diagnosis not present

## 2012-08-07 DIAGNOSIS — I69920 Aphasia following unspecified cerebrovascular disease: Secondary | ICD-10-CM | POA: Diagnosis not present

## 2012-08-07 DIAGNOSIS — G40909 Epilepsy, unspecified, not intractable, without status epilepticus: Secondary | ICD-10-CM | POA: Diagnosis not present

## 2012-08-07 MED ORDER — MEMANTINE HCL ER 28 MG PO CP24: 28.0000 mg | ORAL_CAPSULE | Freq: Every day | ORAL | Status: DC

## 2012-08-07 NOTE — Telephone Encounter (Signed)
Ms Alipio called saying patient is on his last few days of the [employer] given by Dr Marjory Lies.  She would like a rx sent to [facility].  Rx has been sent.

## 2012-08-07 NOTE — Telephone Encounter (Signed)
This is a duplicate note.  Rx was already sent.  Please see previous note.

## 2012-08-08 ENCOUNTER — Telehealth: Payer: Self-pay | Admitting: Nurse Practitioner

## 2012-08-08 NOTE — Telephone Encounter (Signed)
Rx was already sent to the pharmacy on 05/06.  I called patient back.  Got no answer.  Left message.

## 2012-08-09 ENCOUNTER — Telehealth: Payer: Self-pay

## 2012-08-09 DIAGNOSIS — F0391 Unspecified dementia with behavioral disturbance: Secondary | ICD-10-CM | POA: Diagnosis not present

## 2012-08-09 DIAGNOSIS — R269 Unspecified abnormalities of gait and mobility: Secondary | ICD-10-CM | POA: Diagnosis not present

## 2012-08-09 DIAGNOSIS — G40909 Epilepsy, unspecified, not intractable, without status epilepticus: Secondary | ICD-10-CM | POA: Diagnosis not present

## 2012-08-09 DIAGNOSIS — I2510 Atherosclerotic heart disease of native coronary artery without angina pectoris: Secondary | ICD-10-CM | POA: Diagnosis not present

## 2012-08-09 DIAGNOSIS — I69920 Aphasia following unspecified cerebrovascular disease: Secondary | ICD-10-CM | POA: Diagnosis not present

## 2012-08-09 NOTE — Telephone Encounter (Signed)
Patient's spouse called research dept and said she needs auth on [place] because it is not covered.  The patient was given a voucher for one free month of medication.  I am unsure if they still have that or not, so I faxed the pharmacy another voucher.  I called and verified they received the voucher, and they said they did.  They processed rx and it is ready for pick up.  I called spouse back.  Got no answer.  Left message Rx is ready at pharmacy and we are working on PA, and must wait for ins response.  Asked them to call back if there are any further questions.

## 2012-08-31 ENCOUNTER — Telehealth: Payer: Self-pay | Admitting: Diagnostic Neuroimaging

## 2012-08-31 NOTE — Telephone Encounter (Signed)
No other generic meds available. Continue donepezil. Stop namenda. Can ask Shanda Bumps to send info to [employer] for patient assistance programs. -VRP

## 2012-08-31 NOTE — Telephone Encounter (Signed)
Dr. Marjory Lies, this patient is having insurance issues with them covering Namenda. Is there anything else the patient can try? Please advise.

## 2012-09-03 ENCOUNTER — Telehealth: Payer: Self-pay

## 2012-09-03 NOTE — Telephone Encounter (Signed)
Spouse called Wes and left message regarding Rx.  He forwarded message to me.  I called her back and said she feels like the Namenda has not helped the patient at all since he has been on it.  Says he still asks her the same questions over and over.  She mentioned they are not interested in the research study.   States patients cost for Namenda is $800 per 90 days through ins.  She said insurance told her the regular Namenda 5mg  or 10mg  is available at a lower cost through plan, however, she does not know if they should try this or not since she feels XR does not work.  I explained in August the manufacturer will no longer be making Namenda 5 and 10 mg.  Since the patient has prescription coverage, they would not be eligible for Patient Assistance.  The lowest price I could find for 90 days was $834; for 30 days $294. I see a previous phone note that says: Suanne Marker, MD at 08/31/2012 3:22 PM   Status: Signed            No other generic meds available. Continue donepezil. Stop namenda. Can ask Shanda Bumps to send info to [employer] for patient assistance programs. -VRP   (I was not able to join that phone note, had to make a new one.)    I relayed this info to spouse.  She will d/c Namenda.  Will call us back if needed.

## 2012-09-05 ENCOUNTER — Other Ambulatory Visit: Payer: Self-pay

## 2012-09-05 MED ORDER — DONEPEZIL HCL 10 MG PO TABS: 10.0000 mg | ORAL_TABLET | Freq: Every day | ORAL | Status: DC

## 2012-09-05 NOTE — Telephone Encounter (Signed)
Last OV in [facility] from March says patient is to take 10mg  Donepezil daily.

## 2012-09-07 DIAGNOSIS — H903 Sensorineural hearing loss, bilateral: Secondary | ICD-10-CM | POA: Diagnosis not present

## 2012-09-13 DIAGNOSIS — J449 Chronic obstructive pulmonary disease, unspecified: Secondary | ICD-10-CM | POA: Diagnosis not present

## 2012-09-13 DIAGNOSIS — J209 Acute bronchitis, unspecified: Secondary | ICD-10-CM | POA: Diagnosis not present

## 2012-10-15 ENCOUNTER — Telehealth: Payer: Self-pay | Admitting: Diagnostic Neuroimaging

## 2012-10-16 NOTE — Telephone Encounter (Signed)
Called patient and confirmed 10 MG of Aricept .

## 2012-11-27 DIAGNOSIS — L821 Other seborrheic keratosis: Secondary | ICD-10-CM | POA: Diagnosis not present

## 2012-11-27 DIAGNOSIS — D0439 Carcinoma in situ of skin of other parts of face: Secondary | ICD-10-CM | POA: Diagnosis not present

## 2012-11-27 DIAGNOSIS — L570 Actinic keratosis: Secondary | ICD-10-CM | POA: Diagnosis not present

## 2012-11-27 DIAGNOSIS — D485 Neoplasm of uncertain behavior of skin: Secondary | ICD-10-CM | POA: Diagnosis not present

## 2012-11-27 DIAGNOSIS — Z85828 Personal history of other malignant neoplasm of skin: Secondary | ICD-10-CM | POA: Diagnosis not present

## 2012-12-04 DIAGNOSIS — I10 Essential (primary) hypertension: Secondary | ICD-10-CM | POA: Diagnosis not present

## 2012-12-04 DIAGNOSIS — M810 Age-related osteoporosis without current pathological fracture: Secondary | ICD-10-CM | POA: Diagnosis not present

## 2012-12-07 ENCOUNTER — Ambulatory Visit: Payer: Self-pay | Admitting: Nurse Practitioner

## 2012-12-13 DIAGNOSIS — Z23 Encounter for immunization: Secondary | ICD-10-CM | POA: Diagnosis not present

## 2012-12-13 DIAGNOSIS — I10 Essential (primary) hypertension: Secondary | ICD-10-CM | POA: Diagnosis not present

## 2012-12-13 DIAGNOSIS — I2510 Atherosclerotic heart disease of native coronary artery without angina pectoris: Secondary | ICD-10-CM | POA: Diagnosis not present

## 2012-12-13 DIAGNOSIS — E785 Hyperlipidemia, unspecified: Secondary | ICD-10-CM | POA: Diagnosis not present

## 2012-12-13 DIAGNOSIS — M810 Age-related osteoporosis without current pathological fracture: Secondary | ICD-10-CM | POA: Diagnosis not present

## 2012-12-21 DIAGNOSIS — F0280 Dementia in other diseases classified elsewhere without behavioral disturbance: Secondary | ICD-10-CM | POA: Diagnosis not present

## 2013-01-10 DIAGNOSIS — H6120 Impacted cerumen, unspecified ear: Secondary | ICD-10-CM | POA: Diagnosis not present

## 2013-01-22 ENCOUNTER — Encounter: Payer: Self-pay | Admitting: Nurse Practitioner

## 2013-01-22 ENCOUNTER — Ambulatory Visit: Payer: Self-pay | Admitting: Nurse Practitioner

## 2013-01-29 ENCOUNTER — Encounter: Payer: Self-pay | Admitting: Nurse Practitioner

## 2013-01-29 ENCOUNTER — Ambulatory Visit (INDEPENDENT_AMBULATORY_CARE_PROVIDER_SITE_OTHER): Payer: Medicare Other | Admitting: Nurse Practitioner

## 2013-01-29 VITALS — BP 121/66 | HR 86 | Temp 97.8°F | Wt 137.0 lb

## 2013-01-29 DIAGNOSIS — F0391 Unspecified dementia with behavioral disturbance: Secondary | ICD-10-CM | POA: Diagnosis not present

## 2013-01-29 DIAGNOSIS — R269 Unspecified abnormalities of gait and mobility: Secondary | ICD-10-CM | POA: Diagnosis not present

## 2013-01-29 DIAGNOSIS — F03918 Unspecified dementia, unspecified severity, with other behavioral disturbance: Secondary | ICD-10-CM

## 2013-01-29 DIAGNOSIS — F03B18 Unspecified dementia, moderate, with other behavioral disturbance: Secondary | ICD-10-CM

## 2013-01-29 MED ORDER — SERTRALINE HCL 25 MG PO TABS: 25.0000 mg | ORAL_TABLET | Freq: Every day | ORAL | Status: DC

## 2013-01-29 MED ORDER — LEVETIRACETAM 250 MG PO TABS: 250.0000 mg | ORAL_TABLET | Freq: Two times a day (BID) | ORAL | Status: DC

## 2013-01-29 MED ORDER — DONEPEZIL HCL 10 MG PO TABS: 10.0000 mg | ORAL_TABLET | Freq: Every day | ORAL | Status: DC

## 2013-01-29 MED ORDER — QUETIAPINE FUMARATE 25 MG PO TABS: 25.0000 mg | ORAL_TABLET | Freq: Three times a day (TID) | ORAL | Status: DC | PRN

## 2013-01-29 NOTE — Patient Instructions (Signed)
Follow up in 1 year.
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
## 2013-01-31 DIAGNOSIS — F329 Major depressive disorder, single episode, unspecified: Secondary | ICD-10-CM | POA: Diagnosis not present

## 2013-01-31 DIAGNOSIS — M810 Age-related osteoporosis without current pathological fracture: Secondary | ICD-10-CM | POA: Diagnosis not present

## 2013-01-31 DIAGNOSIS — I10 Essential (primary) hypertension: Secondary | ICD-10-CM | POA: Diagnosis not present

## 2013-01-31 DIAGNOSIS — F0280 Dementia in other diseases classified elsewhere without behavioral disturbance: Secondary | ICD-10-CM | POA: Diagnosis not present

## 2013-02-01 ENCOUNTER — Ambulatory Visit: Payer: Self-pay | Admitting: Nurse Practitioner

## 2013-02-01 DIAGNOSIS — Z79899 Other long term (current) drug therapy: Secondary | ICD-10-CM | POA: Diagnosis not present

## 2013-02-03 ENCOUNTER — Inpatient Hospital Stay (HOSPITAL_COMMUNITY)
Admission: EM | Admit: 2013-02-03 | Discharge: 2013-02-05 | DRG: 641 | Disposition: A | Discharge status: Nursing Home (Includes ICF) | Payer: Medicare Other | Attending: Internal Medicine | Admitting: Internal Medicine

## 2013-02-03 DIAGNOSIS — N179 Acute kidney failure, unspecified: Secondary | ICD-10-CM | POA: Diagnosis not present

## 2013-02-03 DIAGNOSIS — R42 Dizziness and giddiness: Secondary | ICD-10-CM | POA: Diagnosis present

## 2013-02-03 DIAGNOSIS — R112 Nausea with vomiting, unspecified: Secondary | ICD-10-CM | POA: Diagnosis not present

## 2013-02-03 DIAGNOSIS — Z9889 Other specified postprocedural states: Secondary | ICD-10-CM

## 2013-02-03 DIAGNOSIS — R109 Unspecified abdominal pain: Secondary | ICD-10-CM | POA: Diagnosis not present

## 2013-02-03 DIAGNOSIS — F39 Unspecified mood [affective] disorder: Secondary | ICD-10-CM | POA: Diagnosis present

## 2013-02-03 DIAGNOSIS — I4891 Unspecified atrial fibrillation: Secondary | ICD-10-CM | POA: Diagnosis present

## 2013-02-03 DIAGNOSIS — F03918 Unspecified dementia, unspecified severity, with other behavioral disturbance: Secondary | ICD-10-CM | POA: Diagnosis present

## 2013-02-03 DIAGNOSIS — Z96659 Presence of unspecified artificial knee joint: Secondary | ICD-10-CM

## 2013-02-03 DIAGNOSIS — I513 Intracardiac thrombosis, not elsewhere classified: Secondary | ICD-10-CM | POA: Diagnosis present

## 2013-02-03 DIAGNOSIS — R197 Diarrhea, unspecified: Secondary | ICD-10-CM | POA: Diagnosis not present

## 2013-02-03 DIAGNOSIS — R031 Nonspecific low blood-pressure reading: Secondary | ICD-10-CM | POA: Diagnosis present

## 2013-02-03 DIAGNOSIS — G40909 Epilepsy, unspecified, not intractable, without status epilepticus: Secondary | ICD-10-CM | POA: Diagnosis present

## 2013-02-03 DIAGNOSIS — K5289 Other specified noninfective gastroenteritis and colitis: Secondary | ICD-10-CM | POA: Diagnosis not present

## 2013-02-03 DIAGNOSIS — E785 Hyperlipidemia, unspecified: Secondary | ICD-10-CM | POA: Diagnosis present

## 2013-02-03 DIAGNOSIS — I2510 Atherosclerotic heart disease of native coronary artery without angina pectoris: Secondary | ICD-10-CM | POA: Diagnosis present

## 2013-02-03 DIAGNOSIS — I10 Essential (primary) hypertension: Secondary | ICD-10-CM | POA: Diagnosis present

## 2013-02-03 DIAGNOSIS — Z79899 Other long term (current) drug therapy: Secondary | ICD-10-CM | POA: Diagnosis not present

## 2013-02-03 DIAGNOSIS — F0391 Unspecified dementia with behavioral disturbance: Secondary | ICD-10-CM | POA: Diagnosis present

## 2013-02-03 DIAGNOSIS — E782 Mixed hyperlipidemia: Secondary | ICD-10-CM

## 2013-02-03 DIAGNOSIS — W19XXXA Unspecified fall, initial encounter: Secondary | ICD-10-CM

## 2013-02-03 DIAGNOSIS — E871 Hypo-osmolality and hyponatremia: Secondary | ICD-10-CM

## 2013-02-03 DIAGNOSIS — K529 Noninfective gastroenteritis and colitis, unspecified: Secondary | ICD-10-CM

## 2013-02-03 DIAGNOSIS — F03B18 Unspecified dementia, moderate, with other behavioral disturbance: Secondary | ICD-10-CM | POA: Diagnosis present

## 2013-02-03 DIAGNOSIS — R569 Unspecified convulsions: Secondary | ICD-10-CM

## 2013-02-03 DIAGNOSIS — K2970 Gastritis, unspecified, without bleeding: Secondary | ICD-10-CM | POA: Diagnosis not present

## 2013-02-03 LAB — GLUCOSE, CAPILLARY: Glucose-Capillary: 145 mg/dL — ABNORMAL HIGH (ref 70–99)

## 2013-02-03 NOTE — ED Notes (Signed)
Pt presents with c/o fall. Pt was at home and fell in the bathroom while brushing his teeth. Pt fell onto the bathroom floor, denies pain, no obvious deformity. Pt has some reddening around his right knee but has hx of previous knee surgery. Pt has hx of dementia but is alert per his norm. During assessment, pt had a sudden onset of nausea, vomiting, and diarrhea.

## 2013-02-03 NOTE — ED Notes (Signed)
Bed: ZO10 Expected date: 02/03/13 Expected time:  Means of arrival:  Comments: EMS 79yo Fall

## 2013-02-03 NOTE — ED Notes (Signed)
Syncopal episode unwitnessed. Wife found on bathroom floor.  Wife states he appeared at baseline. 15 minutes after EMS arrival EMS witness N/V/D. Pt denies LOC Wife concerned about pain to bil knees.

## 2013-02-03 NOTE — ED Provider Notes (Signed)
CSN: [id number]     Arrival date & time 02/03/13  2238 History   First MD Initiated Contact with Patient 02/03/13 2251     Chief Complaint  Patient presents with  . Fall   (Consider location/radiation/quality/duration/timing/severity/associated sxs/prior Treatment) HPI Comments: The 79 year old male patient with a past medical history of Dementia, seizure, CAD s/p stent,  presents to the ED with an un witnessed fall just PTA.  The patient's wife reports the patient was brushing his teeth or trying to use the restroom when he had a fall.  He reported to her that he felt lightheadedness before the fall.  She reports the patient had diarrhea when she found him on the floor.  She reported several episodes of diarrhea after the initial episode.  She reports the patient started Seroquel tonight and Zoloft 2 days ago. She is concerned that he may have injured his knee based on the way it was "twisted" when she walked in on the patient.  He has no complaints, denies pain, and can move all extremities.   The patient's spouse denies a history of A-Fib, or other irregular heart beat, no anticoagulations, no coumadin, only ASA 81 mg.  The history is provided by the patient and the spouse.    Past Medical History  Diagnosis Date  . CAD (coronary artery disease)     Stent to RCA in 2005  . Hyperlipidemia   . Essential hypertension, benign   . CORONARY ATHEROSCLEROSIS, NATIVE VESSEL   . Dementia    Past Surgical History  Procedure Laterality Date  . Replacement total knee bilateral    . Inguinal hernia repair    . Coronary angioplasty with stent placement  2005   No family history on file. History  Substance Use Topics  . Smoking status: Never Smoker   . Smokeless tobacco: Not on file  . Alcohol Use: No    Review of Systems  Unable to perform ROS: Dementia    Allergies  Amlodipine  Home Medications   Current Outpatient Rx  Name  Route  Sig  Dispense  Refill  . alendronate (FOSAMAX)  70 MG tablet   Oral   Take 1 tablet by mouth every Sunday.          Marland Kitchen aspirin 81 MG tablet   Oral   Take 81 mg by mouth every morning.          . Cholecalciferol 1000 UNITS capsule   Oral   Take 1,000 Units by mouth every morning.          . donepezil (ARICEPT) 10 MG tablet   Oral   Take 1 tablet (10 mg total) by mouth daily after breakfast.   30 tablet   12   . levETIRAcetam (KEPPRA) 250 MG tablet   Oral   Take 1 tablet (250 mg total) by mouth 2 (two) times daily.   60 tablet   12   . metoprolol succinate (TOPROL-XL) 25 MG 24 hr tablet   Oral   Take 12.5 mg by mouth every morning.         Marland Kitchen QUEtiapine (SEROQUEL) 25 MG tablet   Oral   Take 25 mg by mouth every 8 (eight) hours as needed (aggitation).         . sertraline (ZOLOFT) 25 MG tablet   Oral   Take 25 mg by mouth every morning.         . valsartan-hydrochlorothiazide (DIOVAN-HCT) 320-12.5 MG per tablet  Oral   Take 1 tablet by mouth every morning.          BP 122/54  Pulse 90  Temp(Src) 97.7 F (36.5 C) (Oral)  Resp 21  Ht 5\' 6"  (1.676 m)  Wt 145 lb (65.772 kg)  BMI 23.41 kg/m2  SpO2 98% Physical Exam  Nursing note and vitals reviewed. Constitutional: He appears well-developed and well-nourished.  HENT:  Head: Normocephalic and atraumatic.  Eyes: EOM are normal.  Neck: Neck supple.  Cardiovascular: An irregularly irregular rhythm present. Exam reveals distant heart sounds.   Pulmonary/Chest: Effort normal and breath sounds normal. He has no wheezes.  Abdominal: Soft. There is no tenderness. There is no rebound.  Musculoskeletal: Normal range of motion. He exhibits no edema and no tenderness.       Right knee: He exhibits normal range of motion, no ecchymosis, no laceration and no erythema. No tenderness found.       Left knee: He exhibits normal range of motion, no ecchymosis, no laceration and no erythema. No tenderness found.  No pain to bilateral UE and LE.  Blood blisters on  bilateral elbows, no tenderness to palpation.   Neurological: He is alert.  Skin: Skin is warm and dry.    ED Course  Procedures (including critical care time) Labs Review Labs Reviewed  GLUCOSE, CAPILLARY - Abnormal; Notable for the following:    Glucose-Capillary 145 (*)    All other components within normal limits  CBC WITH DIFFERENTIAL - Abnormal; Notable for the following:    WBC 13.9 (*)    RBC 3.67 (*)    Hemoglobin 12.5 (*)    HCT 35.6 (*)    MCH 34.1 (*)    Neutrophils Relative % 82 (*)    Neutro Abs 11.4 (*)    Lymphocytes Relative 9 (*)    Monocytes Absolute 1.1 (*)    All other components within normal limits  URINALYSIS, ROUTINE W REFLEX MICROSCOPIC  COMPREHENSIVE METABOLIC PANEL  TROPONIN I  TROPONIN I  TROPONIN I   Imaging Review No results found.    MDM   1. A-fib   2. Fall at home, initial encounter   3. Diarrhea    Patient with a history of dementia presents to the ED with a fall at home and a new onset of A-fib. Patient history of fall after lightheadedness possibly from a vaso-vagal maneuver.  All extremities non tender and Full ROM no obvious deformity on exam.  Discussed patient condition with Dr. Read Drivers and after his evaluation of the patient he agrees that he should be further evaluated for his new onset of a-fib for unknown duration and diarrhea.  Will rehydrate patient in the ED. And consult [facility] for admission.  CBC, CMP, UA ordered.  Discussed patient condition and lab results with Dr. Julian Reil, who will evaluate the patient in the ED for admission.    Clabe Seal, PA-C 02/04/13 [phone]

## 2013-02-04 ENCOUNTER — Encounter (HOSPITAL_COMMUNITY): Payer: Self-pay

## 2013-02-04 DIAGNOSIS — R112 Nausea with vomiting, unspecified: Secondary | ICD-10-CM | POA: Diagnosis not present

## 2013-02-04 DIAGNOSIS — N179 Acute kidney failure, unspecified: Secondary | ICD-10-CM

## 2013-02-04 DIAGNOSIS — I4891 Unspecified atrial fibrillation: Secondary | ICD-10-CM

## 2013-02-04 DIAGNOSIS — R42 Dizziness and giddiness: Secondary | ICD-10-CM | POA: Diagnosis present

## 2013-02-04 DIAGNOSIS — R197 Diarrhea, unspecified: Secondary | ICD-10-CM | POA: Diagnosis not present

## 2013-02-04 LAB — CBC WITH DIFFERENTIAL/PLATELET
Basophils Absolute: 0 10*3/uL (ref 0.0–0.1)
Basophils Absolute: 0 10*3/uL (ref 0.0–0.1)
Basophils Relative: 0 % (ref 0–1)
Eosinophils Absolute: 0.2 10*3/uL (ref 0.0–0.7)
Eosinophils Relative: 3 % (ref 0–5)
HCT: 34.9 % — ABNORMAL LOW (ref 39.0–52.0)
Hemoglobin: 12.5 g/dL — ABNORMAL LOW (ref 13.0–17.0)
Hemoglobin: 12.1 g/dL — ABNORMAL LOW (ref 13.0–17.0)
Lymphocytes Relative: 24 % (ref 12–46)
Lymphs Abs: 2 10*3/uL (ref 0.7–4.0)
MCH: 34.1 pg — ABNORMAL HIGH (ref 26.0–34.0)
MCH: 33.8 pg (ref 26.0–34.0)
MCHC: 35.1 g/dL (ref 30.0–36.0)
MCV: 97.5 fL (ref 78.0–100.0)
Monocytes Absolute: 1.1 10*3/uL — ABNORMAL HIGH (ref 0.1–1.0)
Monocytes Absolute: 0.8 10*3/uL (ref 0.1–1.0)
Monocytes Relative: 8 % (ref 3–12)
Neutro Abs: 5.2 10*3/uL (ref 1.7–7.7)
Neutrophils Relative %: 82 % — ABNORMAL HIGH (ref 43–77)
RBC: 3.58 MIL/uL — ABNORMAL LOW (ref 4.22–5.81)
RDW: 12 % (ref 11.5–15.5)
RDW: 12 % (ref 11.5–15.5)
WBC: 8.3 10*3/uL (ref 4.0–10.5)

## 2013-02-04 LAB — CBC
HCT: 34.2 % — ABNORMAL LOW (ref 39.0–52.0)
Hemoglobin: 11.9 g/dL — ABNORMAL LOW (ref 13.0–17.0)
MCH: 33.5 pg (ref 26.0–34.0)
MCHC: 34.8 g/dL (ref 30.0–36.0)
MCV: 96.3 fL (ref 78.0–100.0)
RBC: 3.55 MIL/uL — ABNORMAL LOW (ref 4.22–5.81)
WBC: 10.1 10*3/uL (ref 4.0–10.5)

## 2013-02-04 LAB — URINE MICROSCOPIC-ADD ON

## 2013-02-04 LAB — COMPREHENSIVE METABOLIC PANEL
AST: 21 U/L (ref 0–37)
Albumin: 3.7 g/dL (ref 3.5–5.2)
BUN: 29 mg/dL — ABNORMAL HIGH (ref 6–23)
Creatinine, Ser: 1.28 mg/dL (ref 0.50–1.35)
Potassium: 4.2 mEq/L (ref 3.5–5.1)
Total Protein: 6.5 g/dL (ref 6.0–8.3)

## 2013-02-04 LAB — BASIC METABOLIC PANEL
BUN: 25 mg/dL — ABNORMAL HIGH (ref 6–23)
CO2: 23 mEq/L (ref 19–32)
Chloride: 99 mEq/L (ref 96–112)
Creatinine, Ser: 1.03 mg/dL (ref 0.50–1.35)
GFR calc non Af Amer: 64 mL/min — ABNORMAL LOW (ref >90)
Glucose, Bld: 101 mg/dL — ABNORMAL HIGH (ref 70–99)
Sodium: 132 mEq/L — ABNORMAL LOW (ref 135–145)

## 2013-02-04 LAB — URINALYSIS, ROUTINE W REFLEX MICROSCOPIC
Bilirubin Urine: NEGATIVE
Ketones, ur: NEGATIVE mg/dL
Leukocytes, UA: NEGATIVE
Nitrite: NEGATIVE
Protein, ur: NEGATIVE mg/dL
pH: 6 (ref 5.0–8.0)

## 2013-02-04 LAB — TROPONIN I
Troponin I: <0.3 ng/mL (ref <0.30)
Troponin I: <0.3 ng/mL (ref <0.30)

## 2013-02-04 MED ORDER — HALOPERIDOL LACTATE 5 MG/ML IJ SOLN
0.5000 mg | Freq: Four times a day (QID) | INTRAMUSCULAR | Status: DC | PRN
  Filled 2013-02-04: qty 1

## 2013-02-04 MED ORDER — SODIUM CHLORIDE 0.9 % IJ SOLN
3.0000 mL | Freq: Two times a day (BID) | INTRAMUSCULAR | Status: DC
  Administered 2013-02-05: 3 mL via INTRAVENOUS

## 2013-02-04 MED ORDER — ASPIRIN EC 81 MG PO TBEC
81.0000 mg | DELAYED_RELEASE_TABLET | Freq: Every morning | ORAL | Status: DC
  Administered 2013-02-04: 81 mg via ORAL
  Filled 2013-02-04: qty 1

## 2013-02-04 MED ORDER — ONDANSETRON HCL 4 MG/2ML IJ SOLN: 4.0000 mg | Freq: Four times a day (QID) | INTRAMUSCULAR | Status: DC | PRN

## 2013-02-04 MED ORDER — SERTRALINE HCL 25 MG PO TABS
25.0000 mg | ORAL_TABLET | Freq: Every morning | ORAL | Status: DC
  Administered 2013-02-04 – 2013-02-05 (×2): 25 mg via ORAL
  Filled 2013-02-04 (×2): qty 1

## 2013-02-04 MED ORDER — SODIUM CHLORIDE 0.9 % IV SOLN
INTRAVENOUS | Status: DC
  Administered 2013-02-04 (×2): via INTRAVENOUS

## 2013-02-04 MED ORDER — ASPIRIN 325 MG PO TABS
325.0000 mg | ORAL_TABLET | Freq: Every day | ORAL | Status: DC
  Administered 2013-02-04 – 2013-02-05 (×2): 325 mg via ORAL
  Filled 2013-02-04 (×2): qty 1

## 2013-02-04 MED ORDER — DONEPEZIL HCL 10 MG PO TABS
10.0000 mg | ORAL_TABLET | Freq: Every day | ORAL | Status: DC
  Administered 2013-02-04: 10 mg via ORAL
  Filled 2013-02-04 (×2): qty 1

## 2013-02-04 MED ORDER — HALOPERIDOL LACTATE 5 MG/ML IJ SOLN: 0.5000 mg | Freq: Four times a day (QID) | INTRAMUSCULAR | Status: DC | PRN

## 2013-02-04 MED ORDER — VITAMIN D 1000 UNITS PO TABS
1000.0000 [IU] | ORAL_TABLET | Freq: Every morning | ORAL | Status: DC
  Administered 2013-02-04 – 2013-02-05 (×2): 1000 [IU] via ORAL
  Filled 2013-02-04 (×2): qty 1

## 2013-02-04 MED ORDER — HEPARIN SODIUM (PORCINE) 5000 UNIT/ML IJ SOLN
5000.0000 [IU] | Freq: Three times a day (TID) | INTRAMUSCULAR | Status: DC
  Administered 2013-02-04 (×2): 5000 [IU] via SUBCUTANEOUS
  Filled 2013-02-04 (×7): qty 1

## 2013-02-04 MED ORDER — ONDANSETRON HCL 4 MG PO TABS: 4.0000 mg | ORAL_TABLET | Freq: Four times a day (QID) | ORAL | Status: DC | PRN

## 2013-02-04 MED ORDER — LEVETIRACETAM 250 MG PO TABS
250.0000 mg | ORAL_TABLET | Freq: Two times a day (BID) | ORAL | Status: DC
  Administered 2013-02-04 – 2013-02-05 (×3): 250 mg via ORAL
  Filled 2013-02-04 (×4): qty 1

## 2013-02-04 MED ORDER — METOPROLOL SUCCINATE 12.5 MG HALF TABLET
12.5000 mg | ORAL_TABLET | Freq: Every morning | ORAL | Status: DC
  Administered 2013-02-04 – 2013-02-05 (×2): 12.5 mg via ORAL
  Filled 2013-02-04 (×2): qty 1

## 2013-02-04 MED ORDER — QUETIAPINE FUMARATE 25 MG PO TABS: 25.0000 mg | ORAL_TABLET | Freq: Three times a day (TID) | ORAL | Status: DC | PRN

## 2013-02-04 MED ORDER — SODIUM CHLORIDE 0.9 % IV BOLUS (SEPSIS)
1000.0000 mL | Freq: Once | INTRAVENOUS | Status: AC
  Administered 2013-02-04: 1000 mL via INTRAVENOUS

## 2013-02-04 NOTE — Care Management Note (Addendum)
    Page 1 of 1   02/05/2013     12:05:04 PM   CARE MANAGEMENT NOTE 02/05/2013  Patient:  SHRAY, HUNLEY   Account Number:  [id number]  Date Initiated:  02/04/2013  Documentation initiated by:  Lanier Clam  Subjective/Objective Assessment:   79 Y/O M ADMITTED W/N/V/D.     Action/Plan:   FROM HOME W/SPOUSE.   Anticipated DC Date:  02/05/2013   Anticipated DC Plan:  ASSISTED LIVING / REST HOME      DC Planning Services  CM consult      Choice offered to / List presented to:             Status of service:  Completed, signed off Medicare Important Message given?   (If response is "NO", the following Medicare IM given date fields will be blank) Date Medicare IM given:   Date Additional Medicare IM given:    Discharge Disposition:  ASSISTED LIVING  Per UR Regulation:  Reviewed for med. necessity/level of care/duration of stay  If discussed at Long Length of Stay Meetings, dates discussed:    Comments:  02/05/13 Luella Gardenhire RN,BSN NCM [phone] D/C [facility],TC SPOKE TO [facility] [phone],THEY PROVIDE THEIR OWN HHC SERVICES THROUGH THEIR CONTRACTS.PATIENT ALREADY HAS RW,EVEN THOUGH IT WAS ORDERED.[facility] REP DME MADE AWARE.  02/04/13 Adriell Polansky RN,BSN NCM [phone]

## 2013-02-04 NOTE — ED Provider Notes (Signed)
Medical screening examination/treatment/procedure(s) were conducted as a shared visit with non-physician practitioner(s) and myself.  I personally evaluated the patient during the encounter.  The patient is awake and alert. Heart rate is irregular but rate controlled. No knee tenderness. We'll have patient admitted for syncope and new onset atrial fibrillation.  EKG Interpretation:  Date & Time: 02/03/2013 10:59 PM  Rate: 85  Rhythm: atrial fibrillation  QRS Axis: normal  Intervals: normal  ST/T Wave abnormalities: normal  Conduction Disutrbances:none  Narrative Interpretation:   Old EKG Reviewed: Unable to access old EKG in [facility]       Carlisle Beers Naima Veldhuizen, MD 02/04/13 0127

## 2013-02-04 NOTE — Clinical Social Work Psychosocial (Signed)
     Clinical Social Work Department BRIEF PSYCHOSOCIAL ASSESSMENT 02/04/2013  Patient:  Austin Gallagher, Austin Gallagher     Account Number:  [id number]     Admit date:  02/03/2013  Clinical Social Worker:  Hattie Perch  Date/Time:  02/04/2013 12:00 M  Referred by:  Physician  Date Referred:  02/04/2013 Referred for  ALF Placement   Other Referral:   Interview type:  Family Other interview type:    PSYCHOSOCIAL DATA Living Status:  FAMILY Admitted from facility:   Level of care:   Primary support name:  Renee Pain Primary support relationship to patient:  SPOUSE Degree of support available:   good    CURRENT CONCERNS Current Concerns  Post-Acute Placement   Other Concerns:    SOCIAL WORK ASSESSMENT / PLAN CSW met with patients wife. Patient was scheduled to go [facility] living tomorrow under private pay status. She states that patient is unaware that he is going as he has dementia and doesnt think he needs it but she is no longer able ot care for him. She is very hopeful that patient will be able to go from hospital as she thinks she can present as a rehab option so that he wont understand and won't fight it as badly.   Assessment/plan status:   Other assessment/ plan:   Information/referral to community resources:    PATIENTS/FAMILYS RESPONSE TO PLAN OF CARE: wife is very upset about patient's mental status and the fact that she could no longer care for patient. she has a supportive family including children. she is grateful that patient may be able to go to [facility] from here because she thinks that will ease the transition.

## 2013-02-04 NOTE — Progress Notes (Signed)
Pt very agitated, pulling off leads. Refusing to put leads on, will not allow staff to place leads on or mittens. MD paged about situation. Central telemetry aware to place pt on stand by.

## 2013-02-04 NOTE — ED Notes (Signed)
MADE AWARE OF PENDING MEDICATION FROM PHARMACY [facility],

## 2013-02-04 NOTE — Progress Notes (Addendum)
Austin Gallagher ZOX:[id number] DOB: 04-16-1925 DOA: 02/03/2013 PCP: Thayer Headings, MD  Brief narrative: 79 year old male, known history CAD S/P RCA 2005, mod dementia, htn, h/o LV mural thrombus admitted 02/04/2013 a.m. with nausea vomiting diarrhea dizziness in a setting of taking new medications Zoloft, Seroquel. Patient is fairly demented at baseline and has had multiple falls over the past 3 years. His dementia got significantly worse since 2011. Wife states that his gait has become lurched and stooped forward.  He is pretty much total care right now  Past medical history-As per Problem list Chart reviewed as below- reviewed  Consultants:  none  Procedures:  none  Antibiotics:  none   Subjective  Alert, not oriented   Objective    Interim History: NAD  Telemetry: Pauses 1.2 seconds: Sinus arrhythmia   Objective: Filed Vitals:   02/04/13 0200 02/04/13 0224 02/04/13 0246 02/04/13 0610  BP: 107/50 117/66 153/66 155/67  Pulse: 90 91 105 100  Temp:  97.6 F (36.4 C) 98.4 F (36.9 C) 98.2 F (36.8 C)  TempSrc:  Oral Oral Oral  Resp: 17 16 22 20   Height:   5\' 6"  (1.676 m)   Weight:   56.473 kg (124 lb 8 oz)   SpO2: 97% 96% 99% 99%    Intake/Output Summary (Last 24 hours) at 02/04/13 1126 Last data filed at 02/04/13 0900  Gross per 24 hour  Intake   1240 ml  Output      0 ml  Net   1240 ml    Exam:  General: EOMI, NCAT, no pallor no icterus Cardiovascular: S1-S2 no murmur rub or gallop Respiratory: clear Abdomen: Soft nontender nondistended Skin lower strength is not swollen Neuro intact grossly to movement but somewhat confused  Data Reviewed: Basic Metabolic Panel:  Recent Labs Lab 02/04/13 0030 02/04/13 0642  NA 132* 132*  K 4.2 3.8  CL 96 99  CO2 26 23  GLUCOSE 132* 101*  BUN 29* 25*  CREATININE 1.28 1.03  CALCIUM 9.7 8.8   Liver Function Tests:  Recent Labs Lab 02/04/13 0030  AST 21  ALT 14  ALKPHOS 43  BILITOT 0.3  PROT  6.5  ALBUMIN 3.7   No results found for this basename: LIPASE, AMYLASE,  in the last 168 hours No results found for this basename: AMMONIA,  in the last 168 hours CBC:  Recent Labs Lab 02/04/13 0030 02/04/13 0642  WBC 13.9* 10.1  NEUTROABS 11.4*  --   HGB 12.5* 11.9*  HCT 35.6* 34.2*  MCV 97.0 96.3  PLT 239 209   Cardiac Enzymes:  Recent Labs Lab 02/04/13 0030 02/04/13 0642  TROPONINI <0.30 <0.30   BNP: No components found with this basename: POCBNP,  CBG:  Recent Labs Lab 02/03/13 2250  GLUCAP 145*    No results found for this or any previous visit (from the past 240 hour(s)).   Studies:              All Imaging reviewed and is as per above notation   Scheduled Meds: . aspirin EC  81 mg Oral q morning - 10a  . cholecalciferol  1,000 Units Oral q morning - 10a  . donepezil  10 mg Oral QPC breakfast  . heparin  5,000 Units Subcutaneous Q8H  . levETIRAcetam  250 mg Oral BID  . metoprolol succinate  12.5 mg Oral q morning - 10a  . sertraline  25 mg Oral q morning - 10a  . sodium chloride  3 mL Intravenous  Q12H   Continuous Infusions: . sodium chloride 125 mL/hr at 02/04/13 1055     Assessment/Plan: 1. Nausea vomiting diarrhea-unclear etiology. Seems self-limiting. Hold any further workup at present, full diet 2. Sinus arrhythmia with pauses/?Afib on admisson-monitor on telemetry 24 hours. Continue metoprolol at current dose 12.5 every morning.  Suspect with his history of falls, Bleeding is a real concern.  Would start on ASA 325. 3. History CAD status post stent 2005-continue aspirin 81 daily 4. H/o LV mural thrombus-ASA 325-not coumadin candidate 5. Severe Dementia with behavioral disturbances-will need institutionalized care.  D/c Aricept/Seroquel.  THese medications have no role in advanced dementia-we'll get therapy to see him--appreciate therapy input in advance.  Needs Rolling walker  Code Status: Full Family Communication: Long discussion with  family at bedsdie Disposition Plan: Obs   Pleas Koch, MD  [facility] Pager [phone] 02/04/2013, 11:26 AM    LOS: 1 day

## 2013-02-04 NOTE — Progress Notes (Signed)
CSW spoke with [facility] from [facility]. They would be able to accept patient tomorrow from hospital.  Nikai Quest C. Javarious Elsayed MSW, LCSW [phone]

## 2013-02-04 NOTE — Progress Notes (Signed)
Pt had TB test administered on Friday 10/31 and was to be read today. Assessed site and no induration noted. TB test negative. Julio Sicks RN

## 2013-02-04 NOTE — H&P (Addendum)
[facility] History and Physical  Austin Gallagher UJW:[id number] DOB: 1925/07/09 DOA: 02/03/2013  Referring physician: ED PCP: Thayer Headings, MD  Chief Complaint: N/V/D, dizziness  HPI: Austin Gallagher is a 79 y.o. male who presents to the ED after developing sudden onset of N/V/D and dizziness after eating dinner with his wife earlier this evening.  He denies chest pain or shortness of breath or pain anywhere.  Wife denies melena, BRBPR, or blood in vomit.  Vomit is NBNB.  She does note he has been started on 2 new medications this week, zoloft a few days ago, and tonight for the first time he took seroquel.  Review of Systems: 12 systems reviewed and otherwise negative.  Past Medical History  Diagnosis Date  . CAD (coronary artery disease)     Stent to RCA in 2005  . Hyperlipidemia   . Essential hypertension, benign   . CORONARY ATHEROSCLEROSIS, NATIVE VESSEL   . Dementia    Past Surgical History  Procedure Laterality Date  . Replacement total knee bilateral    . Inguinal hernia repair    . Coronary angioplasty with stent placement  2005   Social History:  reports that he has never smoked. He does not have any smokeless tobacco history on file. He reports that he does not drink alcohol or use illicit drugs.   Allergies  Allergen Reactions  . Amlodipine Other (See Comments)    REACTION: ankles itching    No family history on file.  Prior to Admission medications   Medication Sig Start Date End Date Taking? Authorizing Provider  alendronate (FOSAMAX) 70 MG tablet Take 1 tablet by mouth every Sunday.  06/16/11  Yes Historical Provider, MD  aspirin 81 MG tablet Take 81 mg by mouth every morning.    Yes Historical Provider, MD  Cholecalciferol 1000 UNITS capsule Take 1,000 Units by mouth every morning.    Yes Historical Provider, MD  donepezil (ARICEPT) 10 MG tablet Take 1 tablet (10 mg total) by mouth daily after breakfast. 01/29/13  Yes Ronal Fear, NP  levETIRAcetam  (KEPPRA) 250 MG tablet Take 1 tablet (250 mg total) by mouth 2 (two) times daily. 01/29/13  Yes Ronal Fear, NP  metoprolol succinate (TOPROL-XL) 25 MG 24 hr tablet Take 12.5 mg by mouth every morning.   Yes Historical Provider, MD  QUEtiapine (SEROQUEL) 25 MG tablet Take 25 mg by mouth every 8 (eight) hours as needed (aggitation).   Yes Historical Provider, MD  sertraline (ZOLOFT) 25 MG tablet Take 25 mg by mouth every morning.   Yes Historical Provider, MD  valsartan-hydrochlorothiazide (DIOVAN-HCT) 320-12.5 MG per tablet Take 1 tablet by mouth every morning. 01/09/13  Yes Historical Provider, MD   Physical Exam: Filed Vitals:   02/04/13 0045  BP: 122/54  Pulse: 90  Temp:   Resp: 21    General:  NAD, resting comfortably in bed Eyes: PEERLA EOMI ENT: mucous membranes moist Neck: supple w/o JVD Cardiovascular: RRR w/o MRG Respiratory: CTA B Abdomen: soft, nt, nd, bs+ Skin: no rash nor lesion Musculoskeletal: MAE, full ROM all 4 extremities Psychiatric: normal tone and affect Neurologic: AAOx3, grossly non-focal  Labs on Admission:  Basic Metabolic Panel:  Recent Labs Lab 02/04/13 0030  NA 132*  K 4.2  CL 96  CO2 26  GLUCOSE 132*  BUN 29*  CREATININE 1.28  CALCIUM 9.7   Liver Function Tests:  Recent Labs Lab 02/04/13 0030  AST 21  ALT 14  ALKPHOS 43  BILITOT 0.3  PROT 6.5  ALBUMIN 3.7   No results found for this basename: LIPASE, AMYLASE,  in the last 168 hours No results found for this basename: AMMONIA,  in the last 168 hours CBC:  Recent Labs Lab 02/04/13 0030  WBC 13.9*  NEUTROABS 11.4*  HGB 12.5*  HCT 35.6*  MCV 97.0  PLT 239   Cardiac Enzymes: No results found for this basename: CKTOTAL, CKMB, CKMBINDEX, TROPONINI,  in the last 168 hours  BNP (last 3 results) No results found for this basename: PROBNP,  in the last 8760 hours CBG:  Recent Labs Lab 02/03/13 2250  GLUCAP 145*    Radiological Exams on Admission: No results  found.  EKG: Independently reviewed.  Assessment/Plan Principal Problem:   Nausea vomiting and diarrhea Active Problems:   Dizziness   AKI (acute kidney injury)   N/V/D - broad DDX, includes viral gastroenteritis, allergy to seroquel (will leave him off of seroquel as a result), doubt MI given diarrhea was most severe symptom (troponin pending), lactate normal so unlikely to be mesenteric ischemia.  Abdomen non-tender, no sick contacts, no recent ABx treatments to put him at risk for C.Diff.  Treating patient with IVF and holding nephrotoxic ARB / HCTZ as his dehydration from this appears to have caused mild AKI as well as dizziness.  Tele monitor, stool cultures pending.  A.Fib - apparently new onset with no h/o this, is rate controlled at this time, tele monitor, but patient not candidate for anticoagulation due to fall risk and seizure history, (see discharge summary from March when he was not felt to be anticoagulation candidate despite mural thrombus in ventricle).    Code Status: Full Code (must indicate code status--if unknown or must be presumed, indicate so) Family Communication: Wife and daughter at bedside (indicate person spoken with, if applicable, with phone number if by telephone) Disposition Plan: Admit to obs (indicate anticipated LOS)  Time spent: 70 min  Marielle Mantione M. [facility] Pager [phone]  If 7PM-7AM, please contact night-coverage [email or web address] Password [email or web address] 02/04/2013, 1:44 AM

## 2013-02-04 NOTE — ED Notes (Addendum)
MD at bedside. Admitting MD present to evaluate tis pt for admission

## 2013-02-04 NOTE — Evaluation (Signed)
Physical Therapy Evaluation Patient Details Name: Austin Gallagher MRN: [id number] DOB: January 21, 1926 Today's Date: 02/04/2013 Time: 4098-1191 PT Time Calculation (min): 14 min  PT Assessment / Plan / Recommendation History of Present Illness  79 year old male, known history CAD S/P RCA 2005, mod dementia, htn, h/o LV mural thrombus admitted 02/04/2013 a.m. with nausea vomiting diarrhea dizziness in a setting of taking new medications Zoloft, Seroquel.  Clinical Impression  Pt will benefit from PT to address deficits below; Pt is cooperative and follows commands for PT on this date    PT Assessment  Patient needs continued PT services    Follow Up Recommendations  SNF    Does the patient have the potential to tolerate intense rehabilitation      Barriers to Discharge        Equipment Recommendations  Rolling walker with 5" wheels    Recommendations for Other Services     Frequency Min 3X/week    Precautions / Restrictions Precautions Precautions: Fall   Pertinent Vitals/Pain Denies pain      Mobility  Bed Mobility Bed Mobility: Supine to Sit Supine to Sit: 5: Supervision Details for Bed Mobility Assistance: incr time to get to EOB Transfers Transfers: Sit to Stand;Stand to Sit Sit to Stand: 4: Min guard;5: Supervision Stand to Sit: 4: Min guard;5: Supervision Details for Transfer Assistance: cues for safety Ambulation/Gait Ambulation/Gait Assistance: 4: Min assist;4: Min guard;5: Supervision Ambulation Distance (Feet): 400 Feet Assistive device: Rolling walker Ambulation/Gait Assistance Details: pt requiring varying levels of assist during gait, improving with distance; no LOB today with head turns and direction changes; Pt requires occasional assist to prevent walker going to far forward  Gait Pattern: Step-through pattern    Exercises     PT Diagnosis: Difficulty walking  PT Problem List: Decreased activity tolerance;Decreased mobility;Decreased balance;Decreased  knowledge of use of DME PT Treatment Interventions: DME instruction;Gait training;Functional mobility training;Therapeutic activities;Therapeutic exercise;Patient/family education     PT Goals(Current goals can be found in the care plan section) Acute Rehab PT Goals PT Goal Formulation: Patient unable to participate in goal setting Time For Goal Achievement: 02/11/13 Potential to Achieve Goals: Good  Visit Information  Last PT Received On: 02/04/13 Assistance Needed: +1 History of Present Illness: 79 year old male, known history CAD S/P RCA 2005, mod dementia, htn, h/o LV mural thrombus admitted 02/04/2013 a.m. with nausea vomiting diarrhea dizziness in a setting of taking new medications Zoloft, Seroquel.       Prior Functioning  Home Living Family/patient expects to be discharged to:: Skilled nursing facility Living Arrangements: Spouse/significant other Additional Comments: no family present at time of eval Communication Communication: No difficulties    Cognition  Cognition Arousal/Alertness: Awake/alert Behavior During Therapy: WFL for tasks assessed/performed Overall Cognitive Status: History of cognitive impairments - at baseline    Extremity/Trunk Assessment Upper Extremity Assessment Upper Extremity Assessment: Overall WFL for tasks assessed Lower Extremity Assessment Lower Extremity Assessment: Overall WFL for tasks assessed   Balance Static Sitting Balance Static Sitting - Balance Support: Feet supported;No upper extremity supported Static Sitting - Level of Assistance: 5: Stand by assistance  End of Session PT - End of Session Equipment Utilized During Treatment: Gait belt Activity Tolerance: Patient tolerated treatment well Patient left: in chair;with call bell/phone within reach;with chair alarm set Nurse Communication: Mobility status  GP     [facility] 02/04/2013, 4:31 PM

## 2013-02-05 DIAGNOSIS — I4891 Unspecified atrial fibrillation: Secondary | ICD-10-CM | POA: Diagnosis not present

## 2013-02-05 DIAGNOSIS — R109 Unspecified abdominal pain: Secondary | ICD-10-CM | POA: Diagnosis not present

## 2013-02-05 DIAGNOSIS — K2970 Gastritis, unspecified, without bleeding: Secondary | ICD-10-CM | POA: Diagnosis not present

## 2013-02-05 DIAGNOSIS — I2510 Atherosclerotic heart disease of native coronary artery without angina pectoris: Secondary | ICD-10-CM

## 2013-02-05 DIAGNOSIS — K5289 Other specified noninfective gastroenteritis and colitis: Secondary | ICD-10-CM

## 2013-02-05 DIAGNOSIS — N179 Acute kidney failure, unspecified: Secondary | ICD-10-CM | POA: Diagnosis not present

## 2013-02-05 DIAGNOSIS — K529 Noninfective gastroenteritis and colitis, unspecified: Secondary | ICD-10-CM

## 2013-02-05 DIAGNOSIS — E871 Hypo-osmolality and hyponatremia: Secondary | ICD-10-CM

## 2013-02-05 LAB — COMPREHENSIVE METABOLIC PANEL
ALT: 14 U/L (ref 0–53)
AST: 20 U/L (ref 0–37)
CO2: 26 mEq/L (ref 19–32)
Chloride: 97 mEq/L (ref 96–112)
Creatinine, Ser: 0.86 mg/dL (ref 0.50–1.35)
GFR calc Af Amer: 89 mL/min — ABNORMAL LOW (ref >90)
GFR calc non Af Amer: 77 mL/min — ABNORMAL LOW (ref >90)
Sodium: 131 mEq/L — ABNORMAL LOW (ref 135–145)
Total Bilirubin: 0.5 mg/dL (ref 0.3–1.2)
Total Protein: 6.4 g/dL (ref 6.0–8.3)

## 2013-02-05 MED ORDER — HALOPERIDOL LACTATE 5 MG/ML IJ SOLN
0.5000 mg | Freq: Four times a day (QID) | INTRAMUSCULAR | Status: DC | PRN
  Administered 2013-02-05: 0.5 mg via INTRAVENOUS

## 2013-02-05 MED ORDER — VALSARTAN 320 MG PO TABS: 320.0000 mg | ORAL_TABLET | Freq: Every day | ORAL | Status: DC

## 2013-02-05 NOTE — Discharge Summary (Signed)
Physician Discharge Summary  Austin Gallagher MVH:[id number] DOB: 1925/04/08 DOA: 02/03/2013  PCP: Thayer Headings, MD  Admit date: 02/03/2013 Discharge date: 02/05/2013  Recommendations for Outpatient Follow-up:  1. To memory care dementia unit for ongoing care 2. Recommend ongoing conversations about goals of care including code status 3. Repeat BMP in 2 weeks to trend sodium and check creatinine.  Please check TSH.  Not checked during this admission due to acute gastroenteritis, but should be evaluated due to new onset PAF.      Discharge Diagnoses:  Principal Problem:   Gastroenteritis Active Problems:   Essential hypertension, benign   Seizure   LV (left ventricular) mural thrombus   Moderate dementia with behavioral disturbance   Nausea vomiting and diarrhea   Dizziness   AKI (acute kidney injury)   Discharge Condition: stable, improved  Diet recommendation: regular  Wt Readings from Last 3 Encounters:  02/04/13 56.473 kg (124 lb 8 oz)  01/29/13 62.143 kg (137 lb)  06/13/12 59.421 kg (131 lb)    History of present illness:   Austin Gallagher is a 79 y.o. male who presents to the ED after developing sudden onset of N/V/D and dizziness after eating dinner with his wife earlier this evening. He denies chest pain or shortness of breath or pain anywhere. Wife denies melena, BRBPR, or blood in vomit. Vomit is NBNB. She does note he has been started on 2 new medications this week, zoloft a few days ago, and tonight for the first time he took seroquel.  Hospital Course:   Gastroenteritis with nausea, vomiting, and diarrhea.  Resolved spontaneously and patient has not had a BM in 2 days and has been eating a regular diet well.    PAF, rate controlled on telemetry.  Continue metoprolol 12.5mg  and ASA daily for stroke prevention.  Patient clearly at high risk for falls (presented with fall at admission), therefore increased risk of bleeding.  Troponins were negative.     AKI,  resolved with hydration and holding diuretic and ARB.  Restart ARB and recheck creatinine in 1-2 weeks.    CAD s/p stent in 2005, chest pain free.  Continue daily ASA and beta blocker.  Currently not on statin as patient has progressive dementia and advanced age.  Defer to cardiologist.  HTN, initially borderline hypotension due to dehydration and orthostasis, however, post hydration, his blood pressure increased to the 150s systolic.  His ARB/HCTZ combination pill had initially been held.  Will restart ARB for now and the HCTZ may be restarted later if he develops swelling or for persistent hypertension .   Hx of LV mural thrombus, not a candidate for coumadin.  Continue ASA.    Moderate to severe dementia with behavioral disturbances.  Aricept and seroquel discontinued as these medications have no role in advanced dementia.  Defer further management to dementia unit MD.   Seizure disorder, seizure free.  Continue keppra.    Mood d/o NOS:  Stable.  Continue zoloft.    Hyponatremia, chronic stable and asymptomatic.  May be due to tea and toast diet secondary to dementia +/- diuretic use and recent dehydration.  Repeat in 1-2 weeks.  Procedures:  none  Consultations:  none  Discharge Exam: Filed Vitals:   02/05/13 0623  BP: 151/77  Pulse: 86  Temp: 97.3 F (36.3 C)  Resp: 18   Filed Vitals:   02/04/13 0610 02/04/13 1330 02/04/13 2036 02/05/13 0623  BP: 155/67 152/70 153/78 151/77  Pulse: 100 78 91 86  Temp: 98.2 F (36.8 C) 98.2 F (36.8 C) 98 F (36.7 C) 97.3 F (36.3 C)  TempSrc: Oral Oral Oral Oral  Resp: 20 18 18 18   Height:      Weight:      SpO2: 99% 98% 98% 99%    General: Thin CM, NAD HEENT:  MMM, NCAT Cardiovascular: IRRR, 1/6 murmur, no rubs or gallops, 2+ pulses Respiratory: CTAB ABD:  NABS, soft, ND/NT MSK:  No LEE Neuro:  Grossly intact Psych:  Alert, oriented to person, hospital, [place], but not time  Discharge Instructions      Discharge  Orders   Future Appointments Provider Department Dept Phone   01/29/2014 10:30 AM Ronal Fear, NP [facility] [phone]   Future Orders Complete By Expires   Call MD for:  difficulty breathing, headache or visual disturbances  As directed    Call MD for:  extreme fatigue  As directed    Call MD for:  hives  As directed    Call MD for:  persistant dizziness or light-headedness  As directed    Call MD for:  persistant nausea and vomiting  As directed    Call MD for:  severe uncontrolled pain  As directed    Call MD for:  temperature >100.4  As directed    Diet general  As directed    Increase activity slowly  As directed        Medication List    STOP taking these medications       donepezil 10 MG tablet  Commonly known as:  ARICEPT     QUEtiapine 25 MG tablet  Commonly known as:  SEROQUEL     valsartan-hydrochlorothiazide 320-12.5 MG per tablet  Commonly known as:  DIOVAN-HCT      TAKE these medications       alendronate 70 MG tablet  Commonly known as:  FOSAMAX  Take 1 tablet by mouth every Sunday.     aspirin 81 MG tablet  Take 81 mg by mouth every morning.     Cholecalciferol 1000 UNITS capsule  Take 1,000 Units by mouth every morning.     levETIRAcetam 250 MG tablet  Commonly known as:  KEPPRA  Take 1 tablet (250 mg total) by mouth 2 (two) times daily.     metoprolol succinate 25 MG 24 hr tablet  Commonly known as:  TOPROL-XL  Take 12.5 mg by mouth every morning.     sertraline 25 MG tablet  Commonly known as:  ZOLOFT  Take 25 mg by mouth every morning.     valsartan 320 MG tablet  Commonly known as:  DIOVAN  Take 1 tablet (320 mg total) by mouth daily.       Follow-up Information   Follow up with Thayer Headings, MD. Schedule an appointment as soon as possible for a visit in 2 weeks. (As needed)    Specialty:  Internal Medicine   Contact information:   [address] [place] [address] [phone]         The results of significant diagnostics from this hospitalization (including imaging, microbiology, ancillary and laboratory) are listed below for reference.    Significant Diagnostic Studies: No results found.  Microbiology: No results found for this or any previous visit (from the past 240 hour(s)).   Labs: Basic Metabolic Panel:  Recent Labs Lab 02/04/13 0030 02/04/13 0642 02/05/13 0715  NA 132* 132* 131*  K 4.2 3.8 4.0  CL 96 99 97  CO2 26 23 26   GLUCOSE 132* 101* 99  BUN 29* 25* 16  CREATININE 1.28 1.03 0.86  CALCIUM 9.7 8.8 9.1   Liver Function Tests:  Recent Labs Lab 02/04/13 0030 02/05/13 0715  AST 21 20  ALT 14 14  ALKPHOS 43 40  BILITOT 0.3 0.5  PROT 6.5 6.4  ALBUMIN 3.7 3.6   No results found for this basename: LIPASE, AMYLASE,  in the last 168 hours No results found for this basename: AMMONIA,  in the last 168 hours CBC:  Recent Labs Lab 02/04/13 0030 02/04/13 0642 02/04/13 1312  WBC 13.9* 10.1 8.3  NEUTROABS 11.4*  --  5.2  HGB 12.5* 11.9* 12.1*  HCT 35.6* 34.2* 34.9*  MCV 97.0 96.3 97.5  PLT 239 209 240   Cardiac Enzymes:  Recent Labs Lab 02/04/13 0030 02/04/13 0642 02/04/13 1312  TROPONINI <0.30 <0.30 <0.30   BNP: BNP (last 3 results) No results found for this basename: PROBNP,  in the last 8760 hours CBG:  Recent Labs Lab 02/03/13 2250  GLUCAP 145*    Time coordinating discharge: 45 minutes  Signed:  Marsa Matteo  [facility] 02/05/2013, 10:28 AM

## 2013-02-05 NOTE — Progress Notes (Signed)
Patient cleared for discharge. [facility]. Packet copied and given to RN. [facility] called for transportation. Family understands no guarantee of payment. Patient believes he is going to rehab and is in a cheerful mood. Family is hoping to ease transition and are relieved that patient is able to go directly from hospital.  Toma Copier C. Kyliee Ortego MSW, LCSW [phone]

## 2013-02-07 ENCOUNTER — Telehealth: Payer: Self-pay | Admitting: Diagnostic Neuroimaging

## 2013-02-07 MED ORDER — DONEPEZIL HCL 10 MG PO TABS: 10.0000 mg | ORAL_TABLET | Freq: Every morning | ORAL | Status: DC

## 2013-02-07 NOTE — Telephone Encounter (Signed)
Spoke to Ms. Austin Gallagher, Hospitalist discontinued her husband's Aricept without consulting Dr. Marjory Lies.  Patient had bad reaction to starting Seroquel, had syncopal episode and had to go to hospital.  I am faxing a new Rx to [facility], at [phone] for Aricept 10 mg daily in the am.  Spoke to Limon, at [place], she rec'd the order for Aricept and I also approved an order for PT and OT for Mr. Koelzer.

## 2013-02-07 NOTE — Telephone Encounter (Signed)
See ED AVS summary

## 2013-02-11 ENCOUNTER — Telehealth: Payer: Self-pay | Admitting: Nurse Practitioner

## 2013-02-11 NOTE — Telephone Encounter (Signed)
I called pts wife. Wife took husband for a walk, and he has been more unsteady today. She is wondering about zoloft effect (started on 01/29/13).   Also he was admitted 02/04/13, for dizziness and fall, possible gastroenteritis and seroquel side effect. Aricept was stopped in the hospital and resued at the memory center.  I suggested she talk with the facility staff and MD/PA/NP to see if any further workup is required and also re: medication management.  Suanne Marker, MD 02/11/2013, 5:50 PM Certified in Neurology, Neurophysiology and Neuroimaging  [facility] [address] [place], [place] [address] [phone]

## 2013-02-12 DIAGNOSIS — I2510 Atherosclerotic heart disease of native coronary artery without angina pectoris: Secondary | ICD-10-CM | POA: Diagnosis not present

## 2013-02-12 DIAGNOSIS — R262 Difficulty in walking, not elsewhere classified: Secondary | ICD-10-CM | POA: Diagnosis not present

## 2013-02-12 DIAGNOSIS — G40909 Epilepsy, unspecified, not intractable, without status epilepticus: Secondary | ICD-10-CM | POA: Diagnosis not present

## 2013-02-12 DIAGNOSIS — F0280 Dementia in other diseases classified elsewhere without behavioral disturbance: Secondary | ICD-10-CM | POA: Diagnosis not present

## 2013-02-12 DIAGNOSIS — M6281 Muscle weakness (generalized): Secondary | ICD-10-CM | POA: Diagnosis not present

## 2013-02-12 DIAGNOSIS — R42 Dizziness and giddiness: Secondary | ICD-10-CM | POA: Diagnosis not present

## 2013-02-12 DIAGNOSIS — F0281 Dementia in other diseases classified elsewhere with behavioral disturbance: Secondary | ICD-10-CM | POA: Diagnosis not present

## 2013-02-12 DIAGNOSIS — F329 Major depressive disorder, single episode, unspecified: Secondary | ICD-10-CM | POA: Diagnosis not present

## 2013-02-12 DIAGNOSIS — R279 Unspecified lack of coordination: Secondary | ICD-10-CM | POA: Diagnosis not present

## 2013-02-12 DIAGNOSIS — M353 Polymyalgia rheumatica: Secondary | ICD-10-CM | POA: Diagnosis not present

## 2013-02-12 DIAGNOSIS — I951 Orthostatic hypotension: Secondary | ICD-10-CM | POA: Diagnosis not present

## 2013-02-12 DIAGNOSIS — I10 Essential (primary) hypertension: Secondary | ICD-10-CM | POA: Diagnosis not present

## 2013-02-12 DIAGNOSIS — M810 Age-related osteoporosis without current pathological fracture: Secondary | ICD-10-CM | POA: Diagnosis not present

## 2013-02-13 DIAGNOSIS — I951 Orthostatic hypotension: Secondary | ICD-10-CM | POA: Diagnosis not present

## 2013-02-13 DIAGNOSIS — M810 Age-related osteoporosis without current pathological fracture: Secondary | ICD-10-CM | POA: Diagnosis not present

## 2013-02-13 DIAGNOSIS — I2510 Atherosclerotic heart disease of native coronary artery without angina pectoris: Secondary | ICD-10-CM | POA: Diagnosis not present

## 2013-02-13 DIAGNOSIS — F0280 Dementia in other diseases classified elsewhere without behavioral disturbance: Secondary | ICD-10-CM | POA: Diagnosis not present

## 2013-02-13 DIAGNOSIS — F329 Major depressive disorder, single episode, unspecified: Secondary | ICD-10-CM | POA: Diagnosis not present

## 2013-02-13 DIAGNOSIS — F0281 Dementia in other diseases classified elsewhere with behavioral disturbance: Secondary | ICD-10-CM | POA: Diagnosis not present

## 2013-02-18 DIAGNOSIS — H524 Presbyopia: Secondary | ICD-10-CM | POA: Diagnosis not present

## 2013-02-18 DIAGNOSIS — Z961 Presence of intraocular lens: Secondary | ICD-10-CM | POA: Diagnosis not present

## 2013-02-18 DIAGNOSIS — H2510 Age-related nuclear cataract, unspecified eye: Secondary | ICD-10-CM | POA: Diagnosis not present

## 2013-02-19 DIAGNOSIS — F329 Major depressive disorder, single episode, unspecified: Secondary | ICD-10-CM | POA: Diagnosis not present

## 2013-02-19 DIAGNOSIS — F0280 Dementia in other diseases classified elsewhere without behavioral disturbance: Secondary | ICD-10-CM | POA: Diagnosis not present

## 2013-02-19 DIAGNOSIS — F0281 Dementia in other diseases classified elsewhere with behavioral disturbance: Secondary | ICD-10-CM | POA: Diagnosis not present

## 2013-02-19 DIAGNOSIS — I2510 Atherosclerotic heart disease of native coronary artery without angina pectoris: Secondary | ICD-10-CM | POA: Diagnosis not present

## 2013-02-19 DIAGNOSIS — I951 Orthostatic hypotension: Secondary | ICD-10-CM | POA: Diagnosis not present

## 2013-02-19 DIAGNOSIS — M810 Age-related osteoporosis without current pathological fracture: Secondary | ICD-10-CM | POA: Diagnosis not present

## 2013-02-20 DIAGNOSIS — F329 Major depressive disorder, single episode, unspecified: Secondary | ICD-10-CM | POA: Diagnosis not present

## 2013-02-20 DIAGNOSIS — F0281 Dementia in other diseases classified elsewhere with behavioral disturbance: Secondary | ICD-10-CM | POA: Diagnosis not present

## 2013-02-20 DIAGNOSIS — I951 Orthostatic hypotension: Secondary | ICD-10-CM | POA: Diagnosis not present

## 2013-02-20 DIAGNOSIS — F0280 Dementia in other diseases classified elsewhere without behavioral disturbance: Secondary | ICD-10-CM | POA: Diagnosis not present

## 2013-02-20 DIAGNOSIS — M810 Age-related osteoporosis without current pathological fracture: Secondary | ICD-10-CM | POA: Diagnosis not present

## 2013-02-20 DIAGNOSIS — I2510 Atherosclerotic heart disease of native coronary artery without angina pectoris: Secondary | ICD-10-CM | POA: Diagnosis not present

## 2013-02-21 DIAGNOSIS — I4891 Unspecified atrial fibrillation: Secondary | ICD-10-CM | POA: Diagnosis not present

## 2013-02-21 DIAGNOSIS — E559 Vitamin D deficiency, unspecified: Secondary | ICD-10-CM | POA: Diagnosis not present

## 2013-02-21 DIAGNOSIS — M810 Age-related osteoporosis without current pathological fracture: Secondary | ICD-10-CM | POA: Diagnosis not present

## 2013-02-21 DIAGNOSIS — R269 Unspecified abnormalities of gait and mobility: Secondary | ICD-10-CM | POA: Diagnosis not present

## 2013-02-21 DIAGNOSIS — I219 Acute myocardial infarction, unspecified: Secondary | ICD-10-CM | POA: Diagnosis not present

## 2013-02-21 DIAGNOSIS — E871 Hypo-osmolality and hyponatremia: Secondary | ICD-10-CM | POA: Diagnosis not present

## 2013-02-21 DIAGNOSIS — R569 Unspecified convulsions: Secondary | ICD-10-CM | POA: Diagnosis not present

## 2013-02-21 DIAGNOSIS — I2510 Atherosclerotic heart disease of native coronary artery without angina pectoris: Secondary | ICD-10-CM | POA: Diagnosis not present

## 2013-02-21 DIAGNOSIS — F329 Major depressive disorder, single episode, unspecified: Secondary | ICD-10-CM | POA: Diagnosis not present

## 2013-02-21 DIAGNOSIS — I951 Orthostatic hypotension: Secondary | ICD-10-CM | POA: Diagnosis not present

## 2013-02-21 DIAGNOSIS — I10 Essential (primary) hypertension: Secondary | ICD-10-CM | POA: Diagnosis not present

## 2013-02-21 DIAGNOSIS — F0280 Dementia in other diseases classified elsewhere without behavioral disturbance: Secondary | ICD-10-CM | POA: Diagnosis not present

## 2013-02-21 DIAGNOSIS — F0281 Dementia in other diseases classified elsewhere with behavioral disturbance: Secondary | ICD-10-CM | POA: Diagnosis not present

## 2013-02-22 DIAGNOSIS — I951 Orthostatic hypotension: Secondary | ICD-10-CM | POA: Diagnosis not present

## 2013-02-22 DIAGNOSIS — F0281 Dementia in other diseases classified elsewhere with behavioral disturbance: Secondary | ICD-10-CM | POA: Diagnosis not present

## 2013-02-22 DIAGNOSIS — M810 Age-related osteoporosis without current pathological fracture: Secondary | ICD-10-CM | POA: Diagnosis not present

## 2013-02-22 DIAGNOSIS — F329 Major depressive disorder, single episode, unspecified: Secondary | ICD-10-CM | POA: Diagnosis not present

## 2013-02-22 DIAGNOSIS — F0280 Dementia in other diseases classified elsewhere without behavioral disturbance: Secondary | ICD-10-CM | POA: Diagnosis not present

## 2013-02-22 DIAGNOSIS — I2510 Atherosclerotic heart disease of native coronary artery without angina pectoris: Secondary | ICD-10-CM | POA: Diagnosis not present

## 2013-02-25 DIAGNOSIS — I2510 Atherosclerotic heart disease of native coronary artery without angina pectoris: Secondary | ICD-10-CM | POA: Diagnosis not present

## 2013-02-25 DIAGNOSIS — F0280 Dementia in other diseases classified elsewhere without behavioral disturbance: Secondary | ICD-10-CM | POA: Diagnosis not present

## 2013-02-25 DIAGNOSIS — I951 Orthostatic hypotension: Secondary | ICD-10-CM | POA: Diagnosis not present

## 2013-02-25 DIAGNOSIS — F329 Major depressive disorder, single episode, unspecified: Secondary | ICD-10-CM | POA: Diagnosis not present

## 2013-02-25 DIAGNOSIS — F0281 Dementia in other diseases classified elsewhere with behavioral disturbance: Secondary | ICD-10-CM | POA: Diagnosis not present

## 2013-02-25 DIAGNOSIS — M810 Age-related osteoporosis without current pathological fracture: Secondary | ICD-10-CM | POA: Diagnosis not present

## 2013-02-26 DIAGNOSIS — I2510 Atherosclerotic heart disease of native coronary artery without angina pectoris: Secondary | ICD-10-CM | POA: Diagnosis not present

## 2013-02-26 DIAGNOSIS — F0280 Dementia in other diseases classified elsewhere without behavioral disturbance: Secondary | ICD-10-CM | POA: Diagnosis not present

## 2013-02-26 DIAGNOSIS — F329 Major depressive disorder, single episode, unspecified: Secondary | ICD-10-CM | POA: Diagnosis not present

## 2013-02-26 DIAGNOSIS — I951 Orthostatic hypotension: Secondary | ICD-10-CM | POA: Diagnosis not present

## 2013-02-26 DIAGNOSIS — F0281 Dementia in other diseases classified elsewhere with behavioral disturbance: Secondary | ICD-10-CM | POA: Diagnosis not present

## 2013-02-26 DIAGNOSIS — M810 Age-related osteoporosis without current pathological fracture: Secondary | ICD-10-CM | POA: Diagnosis not present

## 2013-02-27 DIAGNOSIS — F329 Major depressive disorder, single episode, unspecified: Secondary | ICD-10-CM | POA: Diagnosis not present

## 2013-02-27 DIAGNOSIS — N181 Chronic kidney disease, stage 1: Secondary | ICD-10-CM | POA: Diagnosis not present

## 2013-02-27 DIAGNOSIS — I951 Orthostatic hypotension: Secondary | ICD-10-CM | POA: Diagnosis not present

## 2013-02-27 DIAGNOSIS — D539 Nutritional anemia, unspecified: Secondary | ICD-10-CM | POA: Diagnosis not present

## 2013-02-27 DIAGNOSIS — F0280 Dementia in other diseases classified elsewhere without behavioral disturbance: Secondary | ICD-10-CM | POA: Diagnosis not present

## 2013-02-27 DIAGNOSIS — F0281 Dementia in other diseases classified elsewhere with behavioral disturbance: Secondary | ICD-10-CM | POA: Diagnosis not present

## 2013-02-27 DIAGNOSIS — I2510 Atherosclerotic heart disease of native coronary artery without angina pectoris: Secondary | ICD-10-CM | POA: Diagnosis not present

## 2013-02-27 DIAGNOSIS — M810 Age-related osteoporosis without current pathological fracture: Secondary | ICD-10-CM | POA: Diagnosis not present

## 2013-02-27 DIAGNOSIS — E119 Type 2 diabetes mellitus without complications: Secondary | ICD-10-CM | POA: Diagnosis not present

## 2013-02-27 DIAGNOSIS — D529 Folate deficiency anemia, unspecified: Secondary | ICD-10-CM | POA: Diagnosis not present

## 2013-02-27 DIAGNOSIS — E559 Vitamin D deficiency, unspecified: Secondary | ICD-10-CM | POA: Diagnosis not present

## 2013-02-27 DIAGNOSIS — E039 Hypothyroidism, unspecified: Secondary | ICD-10-CM | POA: Diagnosis not present

## 2013-02-27 DIAGNOSIS — Z5181 Encounter for therapeutic drug level monitoring: Secondary | ICD-10-CM | POA: Diagnosis not present

## 2013-03-01 DIAGNOSIS — F0281 Dementia in other diseases classified elsewhere with behavioral disturbance: Secondary | ICD-10-CM | POA: Diagnosis not present

## 2013-03-01 DIAGNOSIS — I2510 Atherosclerotic heart disease of native coronary artery without angina pectoris: Secondary | ICD-10-CM | POA: Diagnosis not present

## 2013-03-01 DIAGNOSIS — F0280 Dementia in other diseases classified elsewhere without behavioral disturbance: Secondary | ICD-10-CM | POA: Diagnosis not present

## 2013-03-01 DIAGNOSIS — M810 Age-related osteoporosis without current pathological fracture: Secondary | ICD-10-CM | POA: Diagnosis not present

## 2013-03-01 DIAGNOSIS — I951 Orthostatic hypotension: Secondary | ICD-10-CM | POA: Diagnosis not present

## 2013-03-01 DIAGNOSIS — F329 Major depressive disorder, single episode, unspecified: Secondary | ICD-10-CM | POA: Diagnosis not present

## 2013-03-04 DIAGNOSIS — F0280 Dementia in other diseases classified elsewhere without behavioral disturbance: Secondary | ICD-10-CM | POA: Diagnosis not present

## 2013-03-04 DIAGNOSIS — F0281 Dementia in other diseases classified elsewhere with behavioral disturbance: Secondary | ICD-10-CM | POA: Diagnosis not present

## 2013-03-04 DIAGNOSIS — I2510 Atherosclerotic heart disease of native coronary artery without angina pectoris: Secondary | ICD-10-CM | POA: Diagnosis not present

## 2013-03-04 DIAGNOSIS — M810 Age-related osteoporosis without current pathological fracture: Secondary | ICD-10-CM | POA: Diagnosis not present

## 2013-03-04 DIAGNOSIS — I951 Orthostatic hypotension: Secondary | ICD-10-CM | POA: Diagnosis not present

## 2013-03-04 DIAGNOSIS — F329 Major depressive disorder, single episode, unspecified: Secondary | ICD-10-CM | POA: Diagnosis not present

## 2013-03-07 DIAGNOSIS — F0280 Dementia in other diseases classified elsewhere without behavioral disturbance: Secondary | ICD-10-CM | POA: Diagnosis not present

## 2013-03-07 DIAGNOSIS — I2510 Atherosclerotic heart disease of native coronary artery without angina pectoris: Secondary | ICD-10-CM | POA: Diagnosis not present

## 2013-03-07 DIAGNOSIS — F329 Major depressive disorder, single episode, unspecified: Secondary | ICD-10-CM | POA: Diagnosis not present

## 2013-03-07 DIAGNOSIS — M810 Age-related osteoporosis without current pathological fracture: Secondary | ICD-10-CM | POA: Diagnosis not present

## 2013-03-07 DIAGNOSIS — F0281 Dementia in other diseases classified elsewhere with behavioral disturbance: Secondary | ICD-10-CM | POA: Diagnosis not present

## 2013-03-07 DIAGNOSIS — I951 Orthostatic hypotension: Secondary | ICD-10-CM | POA: Diagnosis not present

## 2013-03-08 DIAGNOSIS — F329 Major depressive disorder, single episode, unspecified: Secondary | ICD-10-CM | POA: Diagnosis not present

## 2013-03-08 DIAGNOSIS — F0280 Dementia in other diseases classified elsewhere without behavioral disturbance: Secondary | ICD-10-CM | POA: Diagnosis not present

## 2013-03-08 DIAGNOSIS — M810 Age-related osteoporosis without current pathological fracture: Secondary | ICD-10-CM | POA: Diagnosis not present

## 2013-03-08 DIAGNOSIS — I2510 Atherosclerotic heart disease of native coronary artery without angina pectoris: Secondary | ICD-10-CM | POA: Diagnosis not present

## 2013-03-08 DIAGNOSIS — I951 Orthostatic hypotension: Secondary | ICD-10-CM | POA: Diagnosis not present

## 2013-03-08 DIAGNOSIS — F0281 Dementia in other diseases classified elsewhere with behavioral disturbance: Secondary | ICD-10-CM | POA: Diagnosis not present

## 2013-03-11 DIAGNOSIS — F0390 Unspecified dementia without behavioral disturbance: Secondary | ICD-10-CM | POA: Diagnosis not present

## 2013-03-11 DIAGNOSIS — R488 Other symbolic dysfunctions: Secondary | ICD-10-CM | POA: Diagnosis not present

## 2013-03-11 DIAGNOSIS — R269 Unspecified abnormalities of gait and mobility: Secondary | ICD-10-CM | POA: Diagnosis not present

## 2013-03-11 DIAGNOSIS — F0391 Unspecified dementia with behavioral disturbance: Secondary | ICD-10-CM | POA: Diagnosis not present

## 2013-03-11 DIAGNOSIS — F0280 Dementia in other diseases classified elsewhere without behavioral disturbance: Secondary | ICD-10-CM | POA: Diagnosis not present

## 2013-03-11 DIAGNOSIS — R279 Unspecified lack of coordination: Secondary | ICD-10-CM | POA: Diagnosis not present

## 2013-03-11 DIAGNOSIS — M6281 Muscle weakness (generalized): Secondary | ICD-10-CM | POA: Diagnosis not present

## 2013-03-11 DIAGNOSIS — Z9181 History of falling: Secondary | ICD-10-CM | POA: Diagnosis not present

## 2013-03-11 DIAGNOSIS — R41841 Cognitive communication deficit: Secondary | ICD-10-CM | POA: Diagnosis not present

## 2013-03-12 DIAGNOSIS — M6281 Muscle weakness (generalized): Secondary | ICD-10-CM | POA: Diagnosis not present

## 2013-03-12 DIAGNOSIS — R41841 Cognitive communication deficit: Secondary | ICD-10-CM | POA: Diagnosis not present

## 2013-03-12 DIAGNOSIS — R269 Unspecified abnormalities of gait and mobility: Secondary | ICD-10-CM | POA: Diagnosis not present

## 2013-03-12 DIAGNOSIS — F0280 Dementia in other diseases classified elsewhere without behavioral disturbance: Secondary | ICD-10-CM | POA: Diagnosis not present

## 2013-03-12 DIAGNOSIS — R488 Other symbolic dysfunctions: Secondary | ICD-10-CM | POA: Diagnosis not present

## 2013-03-12 DIAGNOSIS — R279 Unspecified lack of coordination: Secondary | ICD-10-CM | POA: Diagnosis not present

## 2013-03-13 DIAGNOSIS — R269 Unspecified abnormalities of gait and mobility: Secondary | ICD-10-CM | POA: Diagnosis not present

## 2013-03-13 DIAGNOSIS — R279 Unspecified lack of coordination: Secondary | ICD-10-CM | POA: Diagnosis not present

## 2013-03-13 DIAGNOSIS — R488 Other symbolic dysfunctions: Secondary | ICD-10-CM | POA: Diagnosis not present

## 2013-03-13 DIAGNOSIS — F0280 Dementia in other diseases classified elsewhere without behavioral disturbance: Secondary | ICD-10-CM | POA: Diagnosis not present

## 2013-03-13 DIAGNOSIS — R41841 Cognitive communication deficit: Secondary | ICD-10-CM | POA: Diagnosis not present

## 2013-03-13 DIAGNOSIS — M6281 Muscle weakness (generalized): Secondary | ICD-10-CM | POA: Diagnosis not present

## 2013-03-14 DIAGNOSIS — R269 Unspecified abnormalities of gait and mobility: Secondary | ICD-10-CM | POA: Diagnosis not present

## 2013-03-14 DIAGNOSIS — M6281 Muscle weakness (generalized): Secondary | ICD-10-CM | POA: Diagnosis not present

## 2013-03-14 DIAGNOSIS — R41841 Cognitive communication deficit: Secondary | ICD-10-CM | POA: Diagnosis not present

## 2013-03-14 DIAGNOSIS — R488 Other symbolic dysfunctions: Secondary | ICD-10-CM | POA: Diagnosis not present

## 2013-03-14 DIAGNOSIS — R279 Unspecified lack of coordination: Secondary | ICD-10-CM | POA: Diagnosis not present

## 2013-03-14 DIAGNOSIS — F0280 Dementia in other diseases classified elsewhere without behavioral disturbance: Secondary | ICD-10-CM | POA: Diagnosis not present

## 2013-03-14 NOTE — Progress Notes (Signed)
I reviewed note and agree with plan.   Manar Smalling R. Adel Burch, MD  Certified in Neurology, Neurophysiology and Neuroimaging  [facility] [address], [address] [place], [place] [address] [phone]   

## 2013-03-15 DIAGNOSIS — R279 Unspecified lack of coordination: Secondary | ICD-10-CM | POA: Diagnosis not present

## 2013-03-15 DIAGNOSIS — R269 Unspecified abnormalities of gait and mobility: Secondary | ICD-10-CM | POA: Diagnosis not present

## 2013-03-15 DIAGNOSIS — R488 Other symbolic dysfunctions: Secondary | ICD-10-CM | POA: Diagnosis not present

## 2013-03-15 DIAGNOSIS — M6281 Muscle weakness (generalized): Secondary | ICD-10-CM | POA: Diagnosis not present

## 2013-03-15 DIAGNOSIS — R41841 Cognitive communication deficit: Secondary | ICD-10-CM | POA: Diagnosis not present

## 2013-03-15 DIAGNOSIS — F0280 Dementia in other diseases classified elsewhere without behavioral disturbance: Secondary | ICD-10-CM | POA: Diagnosis not present

## 2013-03-18 DIAGNOSIS — R488 Other symbolic dysfunctions: Secondary | ICD-10-CM | POA: Diagnosis not present

## 2013-03-18 DIAGNOSIS — R41841 Cognitive communication deficit: Secondary | ICD-10-CM | POA: Diagnosis not present

## 2013-03-18 DIAGNOSIS — R269 Unspecified abnormalities of gait and mobility: Secondary | ICD-10-CM | POA: Diagnosis not present

## 2013-03-18 DIAGNOSIS — M6281 Muscle weakness (generalized): Secondary | ICD-10-CM | POA: Diagnosis not present

## 2013-03-18 DIAGNOSIS — F0280 Dementia in other diseases classified elsewhere without behavioral disturbance: Secondary | ICD-10-CM | POA: Diagnosis not present

## 2013-03-18 DIAGNOSIS — R279 Unspecified lack of coordination: Secondary | ICD-10-CM | POA: Diagnosis not present

## 2013-03-19 DIAGNOSIS — R279 Unspecified lack of coordination: Secondary | ICD-10-CM | POA: Diagnosis not present

## 2013-03-19 DIAGNOSIS — R488 Other symbolic dysfunctions: Secondary | ICD-10-CM | POA: Diagnosis not present

## 2013-03-19 DIAGNOSIS — R41841 Cognitive communication deficit: Secondary | ICD-10-CM | POA: Diagnosis not present

## 2013-03-19 DIAGNOSIS — R269 Unspecified abnormalities of gait and mobility: Secondary | ICD-10-CM | POA: Diagnosis not present

## 2013-03-19 DIAGNOSIS — F0280 Dementia in other diseases classified elsewhere without behavioral disturbance: Secondary | ICD-10-CM | POA: Diagnosis not present

## 2013-03-19 DIAGNOSIS — M6281 Muscle weakness (generalized): Secondary | ICD-10-CM | POA: Diagnosis not present

## 2013-03-20 DIAGNOSIS — R41841 Cognitive communication deficit: Secondary | ICD-10-CM | POA: Diagnosis not present

## 2013-03-20 DIAGNOSIS — F0280 Dementia in other diseases classified elsewhere without behavioral disturbance: Secondary | ICD-10-CM | POA: Diagnosis not present

## 2013-03-20 DIAGNOSIS — R488 Other symbolic dysfunctions: Secondary | ICD-10-CM | POA: Diagnosis not present

## 2013-03-20 DIAGNOSIS — R279 Unspecified lack of coordination: Secondary | ICD-10-CM | POA: Diagnosis not present

## 2013-03-20 DIAGNOSIS — R269 Unspecified abnormalities of gait and mobility: Secondary | ICD-10-CM | POA: Diagnosis not present

## 2013-03-20 DIAGNOSIS — M6281 Muscle weakness (generalized): Secondary | ICD-10-CM | POA: Diagnosis not present

## 2013-03-21 DIAGNOSIS — R41841 Cognitive communication deficit: Secondary | ICD-10-CM | POA: Diagnosis not present

## 2013-03-21 DIAGNOSIS — F0280 Dementia in other diseases classified elsewhere without behavioral disturbance: Secondary | ICD-10-CM | POA: Diagnosis not present

## 2013-03-21 DIAGNOSIS — R269 Unspecified abnormalities of gait and mobility: Secondary | ICD-10-CM | POA: Diagnosis not present

## 2013-03-21 DIAGNOSIS — M6281 Muscle weakness (generalized): Secondary | ICD-10-CM | POA: Diagnosis not present

## 2013-03-21 DIAGNOSIS — R279 Unspecified lack of coordination: Secondary | ICD-10-CM | POA: Diagnosis not present

## 2013-03-21 DIAGNOSIS — R488 Other symbolic dysfunctions: Secondary | ICD-10-CM | POA: Diagnosis not present

## 2013-03-22 DIAGNOSIS — R269 Unspecified abnormalities of gait and mobility: Secondary | ICD-10-CM | POA: Diagnosis not present

## 2013-03-22 DIAGNOSIS — R41841 Cognitive communication deficit: Secondary | ICD-10-CM | POA: Diagnosis not present

## 2013-03-22 DIAGNOSIS — F0280 Dementia in other diseases classified elsewhere without behavioral disturbance: Secondary | ICD-10-CM | POA: Diagnosis not present

## 2013-03-22 DIAGNOSIS — R488 Other symbolic dysfunctions: Secondary | ICD-10-CM | POA: Diagnosis not present

## 2013-03-22 DIAGNOSIS — M6281 Muscle weakness (generalized): Secondary | ICD-10-CM | POA: Diagnosis not present

## 2013-03-22 DIAGNOSIS — R279 Unspecified lack of coordination: Secondary | ICD-10-CM | POA: Diagnosis not present

## 2013-03-24 DIAGNOSIS — M6281 Muscle weakness (generalized): Secondary | ICD-10-CM | POA: Diagnosis not present

## 2013-03-24 DIAGNOSIS — R279 Unspecified lack of coordination: Secondary | ICD-10-CM | POA: Diagnosis not present

## 2013-03-24 DIAGNOSIS — R488 Other symbolic dysfunctions: Secondary | ICD-10-CM | POA: Diagnosis not present

## 2013-03-24 DIAGNOSIS — R41841 Cognitive communication deficit: Secondary | ICD-10-CM | POA: Diagnosis not present

## 2013-03-24 DIAGNOSIS — F0280 Dementia in other diseases classified elsewhere without behavioral disturbance: Secondary | ICD-10-CM | POA: Diagnosis not present

## 2013-03-24 DIAGNOSIS — R269 Unspecified abnormalities of gait and mobility: Secondary | ICD-10-CM | POA: Diagnosis not present

## 2013-03-25 DIAGNOSIS — M6281 Muscle weakness (generalized): Secondary | ICD-10-CM | POA: Diagnosis not present

## 2013-03-25 DIAGNOSIS — R279 Unspecified lack of coordination: Secondary | ICD-10-CM | POA: Diagnosis not present

## 2013-03-25 DIAGNOSIS — R41841 Cognitive communication deficit: Secondary | ICD-10-CM | POA: Diagnosis not present

## 2013-03-25 DIAGNOSIS — R488 Other symbolic dysfunctions: Secondary | ICD-10-CM | POA: Diagnosis not present

## 2013-03-25 DIAGNOSIS — R269 Unspecified abnormalities of gait and mobility: Secondary | ICD-10-CM | POA: Diagnosis not present

## 2013-03-25 DIAGNOSIS — F0280 Dementia in other diseases classified elsewhere without behavioral disturbance: Secondary | ICD-10-CM | POA: Diagnosis not present

## 2013-03-26 DIAGNOSIS — F0280 Dementia in other diseases classified elsewhere without behavioral disturbance: Secondary | ICD-10-CM | POA: Diagnosis not present

## 2013-03-26 DIAGNOSIS — R269 Unspecified abnormalities of gait and mobility: Secondary | ICD-10-CM | POA: Diagnosis not present

## 2013-03-26 DIAGNOSIS — M6281 Muscle weakness (generalized): Secondary | ICD-10-CM | POA: Diagnosis not present

## 2013-03-26 DIAGNOSIS — R488 Other symbolic dysfunctions: Secondary | ICD-10-CM | POA: Diagnosis not present

## 2013-03-26 DIAGNOSIS — R41841 Cognitive communication deficit: Secondary | ICD-10-CM | POA: Diagnosis not present

## 2013-03-26 DIAGNOSIS — R279 Unspecified lack of coordination: Secondary | ICD-10-CM | POA: Diagnosis not present

## 2013-03-27 DIAGNOSIS — F0280 Dementia in other diseases classified elsewhere without behavioral disturbance: Secondary | ICD-10-CM | POA: Diagnosis not present

## 2013-03-27 DIAGNOSIS — R41841 Cognitive communication deficit: Secondary | ICD-10-CM | POA: Diagnosis not present

## 2013-03-27 DIAGNOSIS — R269 Unspecified abnormalities of gait and mobility: Secondary | ICD-10-CM | POA: Diagnosis not present

## 2013-03-27 DIAGNOSIS — R279 Unspecified lack of coordination: Secondary | ICD-10-CM | POA: Diagnosis not present

## 2013-03-27 DIAGNOSIS — M6281 Muscle weakness (generalized): Secondary | ICD-10-CM | POA: Diagnosis not present

## 2013-03-27 DIAGNOSIS — R488 Other symbolic dysfunctions: Secondary | ICD-10-CM | POA: Diagnosis not present

## 2013-03-29 DIAGNOSIS — R269 Unspecified abnormalities of gait and mobility: Secondary | ICD-10-CM | POA: Diagnosis not present

## 2013-03-29 DIAGNOSIS — R488 Other symbolic dysfunctions: Secondary | ICD-10-CM | POA: Diagnosis not present

## 2013-03-29 DIAGNOSIS — R41841 Cognitive communication deficit: Secondary | ICD-10-CM | POA: Diagnosis not present

## 2013-03-29 DIAGNOSIS — F0280 Dementia in other diseases classified elsewhere without behavioral disturbance: Secondary | ICD-10-CM | POA: Diagnosis not present

## 2013-03-29 DIAGNOSIS — R279 Unspecified lack of coordination: Secondary | ICD-10-CM | POA: Diagnosis not present

## 2013-03-29 DIAGNOSIS — M6281 Muscle weakness (generalized): Secondary | ICD-10-CM | POA: Diagnosis not present

## 2013-03-30 ENCOUNTER — Emergency Department (HOSPITAL_COMMUNITY)
Admission: EM | Admit: 2013-03-30 | Discharge: 2013-03-31 | Disposition: A | Discharge status: Home / Self Care | Payer: Medicare Other | Attending: Emergency Medicine | Admitting: Emergency Medicine

## 2013-03-30 ENCOUNTER — Other Ambulatory Visit: Payer: Self-pay

## 2013-03-30 DIAGNOSIS — E785 Hyperlipidemia, unspecified: Secondary | ICD-10-CM | POA: Insufficient documentation

## 2013-03-30 DIAGNOSIS — R1110 Vomiting, unspecified: Secondary | ICD-10-CM | POA: Diagnosis not present

## 2013-03-30 DIAGNOSIS — F0390 Unspecified dementia without behavioral disturbance: Secondary | ICD-10-CM | POA: Insufficient documentation

## 2013-03-30 DIAGNOSIS — I2510 Atherosclerotic heart disease of native coronary artery without angina pectoris: Secondary | ICD-10-CM | POA: Insufficient documentation

## 2013-03-30 DIAGNOSIS — R4182 Altered mental status, unspecified: Secondary | ICD-10-CM | POA: Diagnosis not present

## 2013-03-30 DIAGNOSIS — R112 Nausea with vomiting, unspecified: Secondary | ICD-10-CM | POA: Diagnosis not present

## 2013-03-30 DIAGNOSIS — I10 Essential (primary) hypertension: Secondary | ICD-10-CM | POA: Insufficient documentation

## 2013-03-30 DIAGNOSIS — Z7982 Long term (current) use of aspirin: Secondary | ICD-10-CM | POA: Diagnosis not present

## 2013-03-30 DIAGNOSIS — Z79899 Other long term (current) drug therapy: Secondary | ICD-10-CM | POA: Insufficient documentation

## 2013-03-30 DIAGNOSIS — Z9861 Coronary angioplasty status: Secondary | ICD-10-CM | POA: Insufficient documentation

## 2013-03-30 LAB — CBC WITH DIFFERENTIAL/PLATELET
Basophils Absolute: 0 10*3/uL (ref 0.0–0.1)
Eosinophils Relative: 1 % (ref 0–5)
Hemoglobin: 13.3 g/dL (ref 13.0–17.0)
Lymphocytes Relative: 7 % — ABNORMAL LOW (ref 12–46)
Lymphs Abs: 0.9 10*3/uL (ref 0.7–4.0)
MCH: 34.2 pg — ABNORMAL HIGH (ref 26.0–34.0)
MCHC: 34.5 g/dL (ref 30.0–36.0)
Monocytes Relative: 4 % (ref 3–12)
Neutro Abs: 11.1 10*3/uL — ABNORMAL HIGH (ref 1.7–7.7)
Neutrophils Relative %: 88 % — ABNORMAL HIGH (ref 43–77)
Platelets: 262 10*3/uL (ref 150–400)
RBC: 3.89 MIL/uL — ABNORMAL LOW (ref 4.22–5.81)
RDW: 12.5 % (ref 11.5–15.5)
WBC: 12.5 10*3/uL — ABNORMAL HIGH (ref 4.0–10.5)

## 2013-03-30 LAB — COMPREHENSIVE METABOLIC PANEL
ALT: 23 U/L (ref 0–53)
Albumin: 4.1 g/dL (ref 3.5–5.2)
Alkaline Phosphatase: 53 U/L (ref 39–117)
BUN: 27 mg/dL — ABNORMAL HIGH (ref 6–23)
CO2: 25 mEq/L (ref 19–32)
Chloride: 99 mEq/L (ref 96–112)
GFR calc Af Amer: 84 mL/min — ABNORMAL LOW (ref >90)
Glucose, Bld: 114 mg/dL — ABNORMAL HIGH (ref 70–99)
Potassium: 4.5 mEq/L (ref 3.5–5.1)
Sodium: 135 mEq/L (ref 135–145)
Total Bilirubin: 0.5 mg/dL (ref 0.3–1.2)

## 2013-03-30 LAB — URINALYSIS W MICROSCOPIC + REFLEX CULTURE
Bilirubin Urine: NEGATIVE
Hgb urine dipstick: NEGATIVE
Ketones, ur: NEGATIVE mg/dL
Nitrite: NEGATIVE
Urobilinogen, UA: 0.2 mg/dL (ref 0.0–1.0)
pH: 6 (ref 5.0–8.0)

## 2013-03-30 LAB — LIPASE, BLOOD: Lipase: 53 U/L (ref 11–59)

## 2013-03-30 MED ORDER — ONDANSETRON 4 MG PO TBDP
4.0000 mg | ORAL_TABLET | Freq: Once | ORAL | Status: AC
  Administered 2013-03-30: 4 mg via ORAL
  Filled 2013-03-30: qty 1

## 2013-03-30 MED ORDER — ONDANSETRON HCL 4 MG/2ML IJ SOLN
4.0000 mg | Freq: Once | INTRAMUSCULAR | Status: AC
  Administered 2013-03-30: 4 mg via INTRAVENOUS
  Filled 2013-03-30: qty 2

## 2013-03-30 MED ORDER — SODIUM CHLORIDE 0.9 % IV BOLUS (SEPSIS)
1000.0000 mL | INTRAVENOUS | Status: AC
  Administered 2013-03-30: 1000 mL via INTRAVENOUS

## 2013-03-30 MED ORDER — ONDANSETRON 4 MG PO TBDP: ORAL_TABLET | ORAL | Status: DC

## 2013-03-30 NOTE — ED Notes (Signed)
[facility] called for transport.  

## 2013-03-30 NOTE — ED Provider Notes (Signed)
CSN: [id number]     Arrival date & time 03/30/13  1810 History   First MD Initiated Contact with Patient 03/30/13 1827     Chief Complaint  Patient presents with  . Emesis   (Consider location/radiation/quality/duration/timing/severity/associated sxs/prior Treatment) Patient is a 79 y.o. male presenting with vomiting. The history is provided by the patient.  Emesis Severity:  Mild Duration:  12 hours Timing:  Intermittent Quality:  Stomach contents Progression:  Unchanged Chronicity:  New Recent urination:  Normal Relieved by:  Nothing Worsened by:  Nothing tried Ineffective treatments:  None tried Associated symptoms: no abdominal pain, no cough, no diarrhea, no fever and no headaches     Past Medical History  Diagnosis Date  . CAD (coronary artery disease)     Stent to RCA in 2005  . Hyperlipidemia   . Essential hypertension, benign   . CORONARY ATHEROSCLEROSIS, NATIVE VESSEL   . Dementia    Past Surgical History  Procedure Laterality Date  . Replacement total knee bilateral    . Inguinal hernia repair    . Coronary angioplasty with stent placement  2005   No family history on file. History  Substance Use Topics  . Smoking status: Never Smoker   . Smokeless tobacco: Not on file  . Alcohol Use: No    Review of Systems  Constitutional: Negative for fever.  HENT: Negative for drooling and rhinorrhea.   Eyes: Negative for pain.  Respiratory: Negative for cough and shortness of breath.   Cardiovascular: Negative for chest pain and leg swelling.  Gastrointestinal: Positive for nausea and vomiting. Negative for abdominal pain and diarrhea.  Genitourinary: Negative for dysuria and hematuria.  Musculoskeletal: Negative for gait problem and neck pain.  Skin: Negative for color change.  Neurological: Negative for numbness and headaches.  Hematological: Negative for adenopathy.  Psychiatric/Behavioral: Negative for behavioral problems.  All other systems reviewed and  are negative.    Allergies  Amlodipine  Home Medications   Current Outpatient Rx  Name  Route  Sig  Dispense  Refill  . alendronate (FOSAMAX) 70 MG tablet   Oral   Take 1 tablet by mouth every Sunday.          Marland Kitchen aspirin 81 MG tablet   Oral   Take 81 mg by mouth every morning.          . Cholecalciferol 1000 UNITS capsule   Oral   Take 1,000 Units by mouth every morning.          . donepezil (ARICEPT) 10 MG tablet   Oral   Take 1 tablet (10 mg total) by mouth every morning.   30 tablet   12   . levETIRAcetam (KEPPRA) 250 MG tablet   Oral   Take 1 tablet (250 mg total) by mouth 2 (two) times daily.   60 tablet   12   . metoprolol succinate (TOPROL-XL) 25 MG 24 hr tablet   Oral   Take 12.5 mg by mouth every morning.         . sertraline (ZOLOFT) 25 MG tablet   Oral   Take 25 mg by mouth every morning.         . valsartan (DIOVAN) 320 MG tablet   Oral   Take 1 tablet (320 mg total) by mouth daily.          BP 166/86  Pulse 116  Temp(Src) 98.1 F (36.7 C) (Oral)  Resp 16  SpO2 96% Physical Exam  Nursing  note and vitals reviewed. Constitutional: He is oriented to person, place, and time. He appears well-developed and well-nourished.  HENT:  Head: Normocephalic and atraumatic.  Right Ear: External ear normal.  Left Ear: External ear normal.  Nose: Nose normal.  Mouth/Throat: Oropharynx is clear and moist. No oropharyngeal exudate.  Eyes: Conjunctivae and EOM are normal. Pupils are equal, round, and reactive to light.  Neck: Normal range of motion. Neck supple.  Cardiovascular: Normal rate, regular rhythm, normal heart sounds and intact distal pulses.  Exam reveals no gallop and no friction rub.   No murmur heard. Pulmonary/Chest: Effort normal and breath sounds normal. No respiratory distress. He has no wheezes.  Abdominal: Soft. Bowel sounds are normal. He exhibits no distension. There is no tenderness. There is no rebound and no guarding.   Musculoskeletal: Normal range of motion. He exhibits no edema and no tenderness.  Neurological: He is alert and oriented to person, place, and time.  Skin: Skin is warm and dry.  Psychiatric: He has a normal mood and affect. His behavior is normal.    ED Course  Procedures (including critical care time) Labs Review Labs Reviewed  CBC WITH DIFFERENTIAL - Abnormal; Notable for the following:    WBC 12.5 (*)    RBC 3.89 (*)    HCT 38.5 (*)    MCH 34.2 (*)    Neutrophils Relative % 88 (*)    Neutro Abs 11.1 (*)    Lymphocytes Relative 7 (*)    All other components within normal limits  COMPREHENSIVE METABOLIC PANEL - Abnormal; Notable for the following:    Glucose, Bld 114 (*)    BUN 27 (*)    GFR calc non Af Amer 73 (*)    GFR calc Af Amer 84 (*)    All other components within normal limits  LIPASE, BLOOD  URINALYSIS W MICROSCOPIC + REFLEX CULTURE   Imaging Review No results found.  EKG Interpretation    Date/Time:  Saturday March 30 2013 20:59:55 EST Ventricular Rate:  94 PR Interval:  119 QRS Duration: 85 QT Interval:  368 QTC Calculation: 460 R Axis:   -18 Text Interpretation:  Sinus or ectopic atrial rhythm Multiple ventricular premature complexes Borderline short PR interval Borderline left axis deviation Borderline T abnormalities, diffuse leads Confirmed by Dalissa Lovin  MD, Shyvonne Chastang ([id number]) on 03/30/2013 11:11:50 PM            MDM   1. Vomiting    6:50 PM 79 y.o. male with a history of coronary artery disease and dementia who presents with 5 episodes of nonbloody nonbilious emesis which started today. The patient denies any other symptoms including abdominal pain, chest pain, shortness of breath. He denies any nausea on exam. He is afebrile and mildly tachycardic here. He is interactive and jovial on exam. Will get screening labwork and IV fluid. Will treat nausea symptomatically.  11:08 PM: HR has dec approp s/p 2L IVF. Pt continues to appear well. Has  eaten a sandwich and drank a coke here. Abd remains benign. Will rec d/c to his facility.  I have discussed the diagnosis/risks/treatment options with the patient and family and believe the pt to be eligible for discharge home to follow-up with pcp as needed. We also discussed returning to the ED immediately if new or worsening sx occur. We discussed the sx which are most concerning (e.g., worsening of vomiting, fever, abd pain) that necessitate immediate return. Any new prescriptions provided to the patient are listed below.  Discharge Medication List as of 03/30/2013 11:09 PM    START taking these medications   Details  ondansetron (ZOFRAN ODT) 4 MG disintegrating tablet 4mg  ODT q4 hours prn nausea/vomit, Print         Junius Argyle, MD 03/31/13 1154

## 2013-03-30 NOTE — ED Notes (Signed)
Pt BIB EMS. Pt is from [facility]. Pt has had n/v since noon after eating lunch. Pt has vomited 5 times. Pt just vomited after being placed in gurney in exam room. Pt has had no diarrhea. Pt denies pain. Pt has hx of dementia. Pt a/o to baseline per EMS. Pt has no acute distress. Skin warm, dry.

## 2013-03-31 DIAGNOSIS — R110 Nausea: Secondary | ICD-10-CM | POA: Diagnosis not present

## 2013-03-31 DIAGNOSIS — R1110 Vomiting, unspecified: Secondary | ICD-10-CM | POA: Diagnosis not present

## 2013-04-01 DIAGNOSIS — R279 Unspecified lack of coordination: Secondary | ICD-10-CM | POA: Diagnosis not present

## 2013-04-01 DIAGNOSIS — R488 Other symbolic dysfunctions: Secondary | ICD-10-CM | POA: Diagnosis not present

## 2013-04-01 DIAGNOSIS — R41841 Cognitive communication deficit: Secondary | ICD-10-CM | POA: Diagnosis not present

## 2013-04-01 DIAGNOSIS — F0280 Dementia in other diseases classified elsewhere without behavioral disturbance: Secondary | ICD-10-CM | POA: Diagnosis not present

## 2013-04-01 DIAGNOSIS — M6281 Muscle weakness (generalized): Secondary | ICD-10-CM | POA: Diagnosis not present

## 2013-04-01 DIAGNOSIS — R269 Unspecified abnormalities of gait and mobility: Secondary | ICD-10-CM | POA: Diagnosis not present

## 2013-04-02 DIAGNOSIS — R41841 Cognitive communication deficit: Secondary | ICD-10-CM | POA: Diagnosis not present

## 2013-04-02 DIAGNOSIS — R269 Unspecified abnormalities of gait and mobility: Secondary | ICD-10-CM | POA: Diagnosis not present

## 2013-04-02 DIAGNOSIS — F0280 Dementia in other diseases classified elsewhere without behavioral disturbance: Secondary | ICD-10-CM | POA: Diagnosis not present

## 2013-04-02 DIAGNOSIS — R279 Unspecified lack of coordination: Secondary | ICD-10-CM | POA: Diagnosis not present

## 2013-04-02 DIAGNOSIS — M6281 Muscle weakness (generalized): Secondary | ICD-10-CM | POA: Diagnosis not present

## 2013-04-02 DIAGNOSIS — R488 Other symbolic dysfunctions: Secondary | ICD-10-CM | POA: Diagnosis not present

## 2013-04-03 DIAGNOSIS — R279 Unspecified lack of coordination: Secondary | ICD-10-CM | POA: Diagnosis not present

## 2013-04-03 DIAGNOSIS — F0280 Dementia in other diseases classified elsewhere without behavioral disturbance: Secondary | ICD-10-CM | POA: Diagnosis not present

## 2013-04-03 DIAGNOSIS — M6281 Muscle weakness (generalized): Secondary | ICD-10-CM | POA: Diagnosis not present

## 2013-04-03 DIAGNOSIS — R269 Unspecified abnormalities of gait and mobility: Secondary | ICD-10-CM | POA: Diagnosis not present

## 2013-04-03 DIAGNOSIS — R488 Other symbolic dysfunctions: Secondary | ICD-10-CM | POA: Diagnosis not present

## 2013-04-03 DIAGNOSIS — R41841 Cognitive communication deficit: Secondary | ICD-10-CM | POA: Diagnosis not present

## 2013-04-05 DIAGNOSIS — R269 Unspecified abnormalities of gait and mobility: Secondary | ICD-10-CM | POA: Diagnosis not present

## 2013-04-05 DIAGNOSIS — R569 Unspecified convulsions: Secondary | ICD-10-CM | POA: Diagnosis not present

## 2013-04-05 DIAGNOSIS — F0391 Unspecified dementia with behavioral disturbance: Secondary | ICD-10-CM | POA: Diagnosis not present

## 2013-04-05 DIAGNOSIS — I10 Essential (primary) hypertension: Secondary | ICD-10-CM | POA: Diagnosis not present

## 2013-04-05 DIAGNOSIS — E871 Hypo-osmolality and hyponatremia: Secondary | ICD-10-CM | POA: Diagnosis not present

## 2013-04-05 DIAGNOSIS — Z9181 History of falling: Secondary | ICD-10-CM | POA: Diagnosis not present

## 2013-04-05 DIAGNOSIS — R41841 Cognitive communication deficit: Secondary | ICD-10-CM | POA: Diagnosis not present

## 2013-04-05 DIAGNOSIS — I4891 Unspecified atrial fibrillation: Secondary | ICD-10-CM | POA: Diagnosis not present

## 2013-04-05 DIAGNOSIS — E559 Vitamin D deficiency, unspecified: Secondary | ICD-10-CM | POA: Diagnosis not present

## 2013-04-05 DIAGNOSIS — F0390 Unspecified dementia without behavioral disturbance: Secondary | ICD-10-CM | POA: Diagnosis not present

## 2013-04-05 DIAGNOSIS — I2510 Atherosclerotic heart disease of native coronary artery without angina pectoris: Secondary | ICD-10-CM | POA: Diagnosis not present

## 2013-04-05 DIAGNOSIS — R279 Unspecified lack of coordination: Secondary | ICD-10-CM | POA: Diagnosis not present

## 2013-04-05 DIAGNOSIS — I219 Acute myocardial infarction, unspecified: Secondary | ICD-10-CM | POA: Diagnosis not present

## 2013-04-05 DIAGNOSIS — F03918 Unspecified dementia, unspecified severity, with other behavioral disturbance: Secondary | ICD-10-CM | POA: Diagnosis not present

## 2013-04-05 DIAGNOSIS — G309 Alzheimer's disease, unspecified: Secondary | ICD-10-CM | POA: Diagnosis not present

## 2013-04-05 DIAGNOSIS — R488 Other symbolic dysfunctions: Secondary | ICD-10-CM | POA: Diagnosis not present

## 2013-04-05 DIAGNOSIS — M6281 Muscle weakness (generalized): Secondary | ICD-10-CM | POA: Diagnosis not present

## 2013-04-05 DIAGNOSIS — F0280 Dementia in other diseases classified elsewhere without behavioral disturbance: Secondary | ICD-10-CM | POA: Diagnosis not present

## 2013-04-08 DIAGNOSIS — R279 Unspecified lack of coordination: Secondary | ICD-10-CM | POA: Diagnosis not present

## 2013-04-08 DIAGNOSIS — R41841 Cognitive communication deficit: Secondary | ICD-10-CM | POA: Diagnosis not present

## 2013-04-08 DIAGNOSIS — R488 Other symbolic dysfunctions: Secondary | ICD-10-CM | POA: Diagnosis not present

## 2013-04-08 DIAGNOSIS — F0280 Dementia in other diseases classified elsewhere without behavioral disturbance: Secondary | ICD-10-CM | POA: Diagnosis not present

## 2013-04-08 DIAGNOSIS — R269 Unspecified abnormalities of gait and mobility: Secondary | ICD-10-CM | POA: Diagnosis not present

## 2013-04-08 DIAGNOSIS — M6281 Muscle weakness (generalized): Secondary | ICD-10-CM | POA: Diagnosis not present

## 2013-04-09 ENCOUNTER — Telehealth: Payer: Self-pay | Admitting: Diagnostic Neuroimaging

## 2013-04-09 DIAGNOSIS — M6281 Muscle weakness (generalized): Secondary | ICD-10-CM | POA: Diagnosis not present

## 2013-04-09 DIAGNOSIS — F0280 Dementia in other diseases classified elsewhere without behavioral disturbance: Secondary | ICD-10-CM | POA: Diagnosis not present

## 2013-04-09 DIAGNOSIS — R41841 Cognitive communication deficit: Secondary | ICD-10-CM | POA: Diagnosis not present

## 2013-04-09 DIAGNOSIS — R269 Unspecified abnormalities of gait and mobility: Secondary | ICD-10-CM | POA: Diagnosis not present

## 2013-04-09 DIAGNOSIS — R279 Unspecified lack of coordination: Secondary | ICD-10-CM | POA: Diagnosis not present

## 2013-04-09 DIAGNOSIS — R488 Other symbolic dysfunctions: Secondary | ICD-10-CM | POA: Diagnosis not present

## 2013-04-09 NOTE — Telephone Encounter (Signed)
I called and spoke with wife, POA. She needs FMLA with physician recommendations completed and faxed to attorney by noon tomorrow. She will bring FMLA form today and I will do what I can.

## 2013-04-09 NOTE — Telephone Encounter (Signed)
Pt's wife was in the office last week and spoke with a nurse regarding a lawsuit that Mr. Harts children have brought against them.  She needs to know if the most recent [place] form can be faxed directly to her lawyer so that he can file a response by tomorrow.  She stated that she would give verbal or if necessary written permission for this office to speak with her lawyer, Lynn Ito.  She asked if the form can be faxed directly to his office at [phone].  Please call.

## 2013-04-10 DIAGNOSIS — R488 Other symbolic dysfunctions: Secondary | ICD-10-CM | POA: Diagnosis not present

## 2013-04-10 DIAGNOSIS — R41841 Cognitive communication deficit: Secondary | ICD-10-CM | POA: Diagnosis not present

## 2013-04-10 DIAGNOSIS — F0280 Dementia in other diseases classified elsewhere without behavioral disturbance: Secondary | ICD-10-CM | POA: Diagnosis not present

## 2013-04-10 DIAGNOSIS — R269 Unspecified abnormalities of gait and mobility: Secondary | ICD-10-CM | POA: Diagnosis not present

## 2013-04-10 DIAGNOSIS — R279 Unspecified lack of coordination: Secondary | ICD-10-CM | POA: Diagnosis not present

## 2013-04-10 DIAGNOSIS — Z0289 Encounter for other administrative examinations: Secondary | ICD-10-CM

## 2013-04-10 DIAGNOSIS — G309 Alzheimer's disease, unspecified: Secondary | ICD-10-CM | POA: Diagnosis not present

## 2013-04-10 DIAGNOSIS — M6281 Muscle weakness (generalized): Secondary | ICD-10-CM | POA: Diagnosis not present

## 2013-04-11 DIAGNOSIS — R488 Other symbolic dysfunctions: Secondary | ICD-10-CM | POA: Diagnosis not present

## 2013-04-11 DIAGNOSIS — M6281 Muscle weakness (generalized): Secondary | ICD-10-CM | POA: Diagnosis not present

## 2013-04-11 DIAGNOSIS — F0280 Dementia in other diseases classified elsewhere without behavioral disturbance: Secondary | ICD-10-CM | POA: Diagnosis not present

## 2013-04-11 DIAGNOSIS — R279 Unspecified lack of coordination: Secondary | ICD-10-CM | POA: Diagnosis not present

## 2013-04-11 DIAGNOSIS — R269 Unspecified abnormalities of gait and mobility: Secondary | ICD-10-CM | POA: Diagnosis not present

## 2013-04-11 DIAGNOSIS — R41841 Cognitive communication deficit: Secondary | ICD-10-CM | POA: Diagnosis not present

## 2013-04-12 ENCOUNTER — Telehealth: Payer: Self-pay | Admitting: Diagnostic Neuroimaging

## 2013-04-12 DIAGNOSIS — R41841 Cognitive communication deficit: Secondary | ICD-10-CM | POA: Diagnosis not present

## 2013-04-12 DIAGNOSIS — F0280 Dementia in other diseases classified elsewhere without behavioral disturbance: Secondary | ICD-10-CM | POA: Diagnosis not present

## 2013-04-12 DIAGNOSIS — R488 Other symbolic dysfunctions: Secondary | ICD-10-CM | POA: Diagnosis not present

## 2013-04-12 DIAGNOSIS — R279 Unspecified lack of coordination: Secondary | ICD-10-CM | POA: Diagnosis not present

## 2013-04-12 DIAGNOSIS — R269 Unspecified abnormalities of gait and mobility: Secondary | ICD-10-CM | POA: Diagnosis not present

## 2013-04-12 DIAGNOSIS — M6281 Muscle weakness (generalized): Secondary | ICD-10-CM | POA: Diagnosis not present

## 2013-04-12 NOTE — Telephone Encounter (Signed)
Pt's wife called to check the status of the forms.  She states that the court date is January 15th and they will need them before then.  If they are ready she can come andpick them up as she is going to see the lawyer this afternoon.  She stated that this was very important and asked for a call back.  Asked to use the mobile # first [phone] and you can leave a message on the voicemail.  Thank you.

## 2013-04-12 NOTE — Telephone Encounter (Signed)
I reviewed form that patient's wife dropped off. This is a long term care form. We do not complete these forms. I asked wife if she would simply like me to fax over the last office note. Wife stated that is what she would like me to do and to destroy the form she sent over. I will do that now.

## 2013-04-15 DIAGNOSIS — R488 Other symbolic dysfunctions: Secondary | ICD-10-CM | POA: Diagnosis not present

## 2013-04-15 DIAGNOSIS — F0280 Dementia in other diseases classified elsewhere without behavioral disturbance: Secondary | ICD-10-CM | POA: Diagnosis not present

## 2013-04-15 DIAGNOSIS — R279 Unspecified lack of coordination: Secondary | ICD-10-CM | POA: Diagnosis not present

## 2013-04-15 DIAGNOSIS — M6281 Muscle weakness (generalized): Secondary | ICD-10-CM | POA: Diagnosis not present

## 2013-04-15 DIAGNOSIS — R269 Unspecified abnormalities of gait and mobility: Secondary | ICD-10-CM | POA: Diagnosis not present

## 2013-04-15 DIAGNOSIS — R41841 Cognitive communication deficit: Secondary | ICD-10-CM | POA: Diagnosis not present

## 2013-04-16 DIAGNOSIS — R279 Unspecified lack of coordination: Secondary | ICD-10-CM | POA: Diagnosis not present

## 2013-04-16 DIAGNOSIS — R488 Other symbolic dysfunctions: Secondary | ICD-10-CM | POA: Diagnosis not present

## 2013-04-16 DIAGNOSIS — F0280 Dementia in other diseases classified elsewhere without behavioral disturbance: Secondary | ICD-10-CM | POA: Diagnosis not present

## 2013-04-16 DIAGNOSIS — M6281 Muscle weakness (generalized): Secondary | ICD-10-CM | POA: Diagnosis not present

## 2013-04-16 DIAGNOSIS — R41841 Cognitive communication deficit: Secondary | ICD-10-CM | POA: Diagnosis not present

## 2013-04-16 DIAGNOSIS — R269 Unspecified abnormalities of gait and mobility: Secondary | ICD-10-CM | POA: Diagnosis not present

## 2013-04-17 DIAGNOSIS — R269 Unspecified abnormalities of gait and mobility: Secondary | ICD-10-CM | POA: Diagnosis not present

## 2013-04-17 DIAGNOSIS — R41841 Cognitive communication deficit: Secondary | ICD-10-CM | POA: Diagnosis not present

## 2013-04-17 DIAGNOSIS — R279 Unspecified lack of coordination: Secondary | ICD-10-CM | POA: Diagnosis not present

## 2013-04-17 DIAGNOSIS — M6281 Muscle weakness (generalized): Secondary | ICD-10-CM | POA: Diagnosis not present

## 2013-04-17 DIAGNOSIS — F0280 Dementia in other diseases classified elsewhere without behavioral disturbance: Secondary | ICD-10-CM | POA: Diagnosis not present

## 2013-04-17 DIAGNOSIS — R488 Other symbolic dysfunctions: Secondary | ICD-10-CM | POA: Diagnosis not present

## 2013-04-18 DIAGNOSIS — M6281 Muscle weakness (generalized): Secondary | ICD-10-CM | POA: Diagnosis not present

## 2013-04-18 DIAGNOSIS — R269 Unspecified abnormalities of gait and mobility: Secondary | ICD-10-CM | POA: Diagnosis not present

## 2013-04-18 DIAGNOSIS — R279 Unspecified lack of coordination: Secondary | ICD-10-CM | POA: Diagnosis not present

## 2013-04-18 DIAGNOSIS — F0280 Dementia in other diseases classified elsewhere without behavioral disturbance: Secondary | ICD-10-CM | POA: Diagnosis not present

## 2013-04-18 DIAGNOSIS — R488 Other symbolic dysfunctions: Secondary | ICD-10-CM | POA: Diagnosis not present

## 2013-04-18 DIAGNOSIS — R41841 Cognitive communication deficit: Secondary | ICD-10-CM | POA: Diagnosis not present

## 2013-04-19 DIAGNOSIS — R279 Unspecified lack of coordination: Secondary | ICD-10-CM | POA: Diagnosis not present

## 2013-04-19 DIAGNOSIS — R488 Other symbolic dysfunctions: Secondary | ICD-10-CM | POA: Diagnosis not present

## 2013-04-19 DIAGNOSIS — G309 Alzheimer's disease, unspecified: Secondary | ICD-10-CM | POA: Diagnosis not present

## 2013-04-19 DIAGNOSIS — R41841 Cognitive communication deficit: Secondary | ICD-10-CM | POA: Diagnosis not present

## 2013-04-19 DIAGNOSIS — R269 Unspecified abnormalities of gait and mobility: Secondary | ICD-10-CM | POA: Diagnosis not present

## 2013-04-19 DIAGNOSIS — M6281 Muscle weakness (generalized): Secondary | ICD-10-CM | POA: Diagnosis not present

## 2013-04-19 DIAGNOSIS — F0280 Dementia in other diseases classified elsewhere without behavioral disturbance: Secondary | ICD-10-CM | POA: Diagnosis not present

## 2013-04-20 DIAGNOSIS — M6281 Muscle weakness (generalized): Secondary | ICD-10-CM | POA: Diagnosis not present

## 2013-04-20 DIAGNOSIS — R488 Other symbolic dysfunctions: Secondary | ICD-10-CM | POA: Diagnosis not present

## 2013-04-20 DIAGNOSIS — R41841 Cognitive communication deficit: Secondary | ICD-10-CM | POA: Diagnosis not present

## 2013-04-20 DIAGNOSIS — F0280 Dementia in other diseases classified elsewhere without behavioral disturbance: Secondary | ICD-10-CM | POA: Diagnosis not present

## 2013-04-20 DIAGNOSIS — R279 Unspecified lack of coordination: Secondary | ICD-10-CM | POA: Diagnosis not present

## 2013-04-20 DIAGNOSIS — R269 Unspecified abnormalities of gait and mobility: Secondary | ICD-10-CM | POA: Diagnosis not present

## 2013-04-20 DIAGNOSIS — G309 Alzheimer's disease, unspecified: Secondary | ICD-10-CM | POA: Diagnosis not present

## 2013-04-22 DIAGNOSIS — R488 Other symbolic dysfunctions: Secondary | ICD-10-CM | POA: Diagnosis not present

## 2013-04-22 DIAGNOSIS — M6281 Muscle weakness (generalized): Secondary | ICD-10-CM | POA: Diagnosis not present

## 2013-04-22 DIAGNOSIS — R41841 Cognitive communication deficit: Secondary | ICD-10-CM | POA: Diagnosis not present

## 2013-04-22 DIAGNOSIS — F0280 Dementia in other diseases classified elsewhere without behavioral disturbance: Secondary | ICD-10-CM | POA: Diagnosis not present

## 2013-04-22 DIAGNOSIS — G309 Alzheimer's disease, unspecified: Secondary | ICD-10-CM | POA: Diagnosis not present

## 2013-04-22 DIAGNOSIS — R279 Unspecified lack of coordination: Secondary | ICD-10-CM | POA: Diagnosis not present

## 2013-04-22 DIAGNOSIS — R269 Unspecified abnormalities of gait and mobility: Secondary | ICD-10-CM | POA: Diagnosis not present

## 2013-04-23 DIAGNOSIS — R488 Other symbolic dysfunctions: Secondary | ICD-10-CM | POA: Diagnosis not present

## 2013-04-23 DIAGNOSIS — M6281 Muscle weakness (generalized): Secondary | ICD-10-CM | POA: Diagnosis not present

## 2013-04-23 DIAGNOSIS — R269 Unspecified abnormalities of gait and mobility: Secondary | ICD-10-CM | POA: Diagnosis not present

## 2013-04-23 DIAGNOSIS — F0280 Dementia in other diseases classified elsewhere without behavioral disturbance: Secondary | ICD-10-CM | POA: Diagnosis not present

## 2013-04-23 DIAGNOSIS — R41841 Cognitive communication deficit: Secondary | ICD-10-CM | POA: Diagnosis not present

## 2013-04-23 DIAGNOSIS — R279 Unspecified lack of coordination: Secondary | ICD-10-CM | POA: Diagnosis not present

## 2013-04-23 DIAGNOSIS — G309 Alzheimer's disease, unspecified: Secondary | ICD-10-CM | POA: Diagnosis not present

## 2013-04-24 DIAGNOSIS — R488 Other symbolic dysfunctions: Secondary | ICD-10-CM | POA: Diagnosis not present

## 2013-04-24 DIAGNOSIS — G309 Alzheimer's disease, unspecified: Secondary | ICD-10-CM | POA: Diagnosis not present

## 2013-04-24 DIAGNOSIS — R279 Unspecified lack of coordination: Secondary | ICD-10-CM | POA: Diagnosis not present

## 2013-04-24 DIAGNOSIS — R41841 Cognitive communication deficit: Secondary | ICD-10-CM | POA: Diagnosis not present

## 2013-04-24 DIAGNOSIS — R269 Unspecified abnormalities of gait and mobility: Secondary | ICD-10-CM | POA: Diagnosis not present

## 2013-04-24 DIAGNOSIS — F0280 Dementia in other diseases classified elsewhere without behavioral disturbance: Secondary | ICD-10-CM | POA: Diagnosis not present

## 2013-04-24 DIAGNOSIS — M6281 Muscle weakness (generalized): Secondary | ICD-10-CM | POA: Diagnosis not present

## 2013-04-25 DIAGNOSIS — R269 Unspecified abnormalities of gait and mobility: Secondary | ICD-10-CM | POA: Diagnosis not present

## 2013-04-25 DIAGNOSIS — R488 Other symbolic dysfunctions: Secondary | ICD-10-CM | POA: Diagnosis not present

## 2013-04-25 DIAGNOSIS — M6281 Muscle weakness (generalized): Secondary | ICD-10-CM | POA: Diagnosis not present

## 2013-04-25 DIAGNOSIS — R41841 Cognitive communication deficit: Secondary | ICD-10-CM | POA: Diagnosis not present

## 2013-04-25 DIAGNOSIS — R279 Unspecified lack of coordination: Secondary | ICD-10-CM | POA: Diagnosis not present

## 2013-04-25 DIAGNOSIS — F0280 Dementia in other diseases classified elsewhere without behavioral disturbance: Secondary | ICD-10-CM | POA: Diagnosis not present

## 2013-05-28 DIAGNOSIS — D0439 Carcinoma in situ of skin of other parts of face: Secondary | ICD-10-CM | POA: Diagnosis not present

## 2013-05-28 DIAGNOSIS — D0430 Carcinoma in situ of skin of unspecified part of face: Secondary | ICD-10-CM | POA: Diagnosis not present

## 2013-05-28 DIAGNOSIS — L570 Actinic keratosis: Secondary | ICD-10-CM | POA: Diagnosis not present

## 2013-05-28 DIAGNOSIS — C44319 Basal cell carcinoma of skin of other parts of face: Secondary | ICD-10-CM | POA: Diagnosis not present

## 2013-05-28 DIAGNOSIS — L821 Other seborrheic keratosis: Secondary | ICD-10-CM | POA: Diagnosis not present

## 2013-05-28 DIAGNOSIS — Z85828 Personal history of other malignant neoplasm of skin: Secondary | ICD-10-CM | POA: Diagnosis not present

## 2013-05-28 DIAGNOSIS — D485 Neoplasm of uncertain behavior of skin: Secondary | ICD-10-CM | POA: Diagnosis not present

## 2013-06-04 DIAGNOSIS — Z0000 Encounter for general adult medical examination without abnormal findings: Secondary | ICD-10-CM | POA: Diagnosis not present

## 2013-06-04 DIAGNOSIS — I10 Essential (primary) hypertension: Secondary | ICD-10-CM | POA: Diagnosis not present

## 2013-06-04 DIAGNOSIS — Z23 Encounter for immunization: Secondary | ICD-10-CM | POA: Diagnosis not present

## 2013-06-04 DIAGNOSIS — Z125 Encounter for screening for malignant neoplasm of prostate: Secondary | ICD-10-CM | POA: Diagnosis not present

## 2013-06-04 DIAGNOSIS — M810 Age-related osteoporosis without current pathological fracture: Secondary | ICD-10-CM | POA: Diagnosis not present

## 2013-06-04 DIAGNOSIS — Z1331 Encounter for screening for depression: Secondary | ICD-10-CM | POA: Diagnosis not present

## 2013-06-18 DIAGNOSIS — E785 Hyperlipidemia, unspecified: Secondary | ICD-10-CM | POA: Diagnosis not present

## 2013-06-18 DIAGNOSIS — M810 Age-related osteoporosis without current pathological fracture: Secondary | ICD-10-CM | POA: Diagnosis not present

## 2013-06-18 DIAGNOSIS — I2510 Atherosclerotic heart disease of native coronary artery without angina pectoris: Secondary | ICD-10-CM | POA: Diagnosis not present

## 2013-06-18 DIAGNOSIS — I10 Essential (primary) hypertension: Secondary | ICD-10-CM | POA: Diagnosis not present

## 2013-07-08 DIAGNOSIS — J449 Chronic obstructive pulmonary disease, unspecified: Secondary | ICD-10-CM | POA: Diagnosis not present

## 2013-07-08 DIAGNOSIS — J40 Bronchitis, not specified as acute or chronic: Secondary | ICD-10-CM | POA: Diagnosis not present

## 2013-07-08 DIAGNOSIS — R0609 Other forms of dyspnea: Secondary | ICD-10-CM | POA: Diagnosis not present

## 2013-07-09 DIAGNOSIS — J449 Chronic obstructive pulmonary disease, unspecified: Secondary | ICD-10-CM | POA: Diagnosis not present

## 2013-09-23 ENCOUNTER — Other Ambulatory Visit: Payer: Self-pay | Admitting: Diagnostic Neuroimaging

## 2013-10-22 DIAGNOSIS — L820 Inflamed seborrheic keratosis: Secondary | ICD-10-CM | POA: Diagnosis not present

## 2013-10-22 DIAGNOSIS — Z85828 Personal history of other malignant neoplasm of skin: Secondary | ICD-10-CM | POA: Diagnosis not present

## 2013-10-22 DIAGNOSIS — L570 Actinic keratosis: Secondary | ICD-10-CM | POA: Diagnosis not present

## 2013-11-26 DIAGNOSIS — Z85828 Personal history of other malignant neoplasm of skin: Secondary | ICD-10-CM | POA: Diagnosis not present

## 2013-11-26 DIAGNOSIS — L821 Other seborrheic keratosis: Secondary | ICD-10-CM | POA: Diagnosis not present

## 2013-11-26 DIAGNOSIS — L570 Actinic keratosis: Secondary | ICD-10-CM | POA: Diagnosis not present

## 2013-12-17 DIAGNOSIS — M810 Age-related osteoporosis without current pathological fracture: Secondary | ICD-10-CM | POA: Diagnosis not present

## 2013-12-17 DIAGNOSIS — I10 Essential (primary) hypertension: Secondary | ICD-10-CM | POA: Diagnosis not present

## 2014-01-01 DIAGNOSIS — I2510 Atherosclerotic heart disease of native coronary artery without angina pectoris: Secondary | ICD-10-CM | POA: Diagnosis not present

## 2014-01-01 DIAGNOSIS — E785 Hyperlipidemia, unspecified: Secondary | ICD-10-CM | POA: Diagnosis not present

## 2014-01-01 DIAGNOSIS — M810 Age-related osteoporosis without current pathological fracture: Secondary | ICD-10-CM | POA: Diagnosis not present

## 2014-01-01 DIAGNOSIS — Z23 Encounter for immunization: Secondary | ICD-10-CM | POA: Diagnosis not present

## 2014-01-01 DIAGNOSIS — I10 Essential (primary) hypertension: Secondary | ICD-10-CM | POA: Diagnosis not present

## 2014-01-09 DIAGNOSIS — R410 Disorientation, unspecified: Secondary | ICD-10-CM | POA: Diagnosis not present

## 2014-01-28 ENCOUNTER — Ambulatory Visit (INDEPENDENT_AMBULATORY_CARE_PROVIDER_SITE_OTHER): Payer: Medicare Other | Admitting: Internal Medicine

## 2014-01-28 VITALS — BP 124/74 | HR 76 | Temp 98.0°F | Resp 18 | Ht 66.0 in | Wt 140.0 lb

## 2014-01-28 DIAGNOSIS — F0391 Unspecified dementia with behavioral disturbance: Secondary | ICD-10-CM

## 2014-01-28 DIAGNOSIS — S61002A Unspecified open wound of left thumb without damage to nail, initial encounter: Secondary | ICD-10-CM

## 2014-01-28 DIAGNOSIS — R451 Restlessness and agitation: Secondary | ICD-10-CM

## 2014-01-28 DIAGNOSIS — F03918 Unspecified dementia, unspecified severity, with other behavioral disturbance: Secondary | ICD-10-CM

## 2014-01-28 DIAGNOSIS — S61012A Laceration without foreign body of left thumb without damage to nail, initial encounter: Secondary | ICD-10-CM

## 2014-01-28 NOTE — Progress Notes (Signed)
   Subjective:    Patient ID: Austin Gallagher, male    DOB: [birth year], 80 y.o.   MRN: [id number]  HPI At home and cut thumb on something sharp. Has dementia , acting pleasant but not sure how injury occurred. Thumb wound on distal pad and thumb has full function.   Review of Systems     Objective:   Physical Exam  Constitutional: He is oriented to person, place, and time. He appears well-developed and well-nourished.  HENT:  Head: Normocephalic and atraumatic.  Eyes: EOM are normal.  Neck: Normal range of motion.  Pulmonary/Chest: Effort normal.  Musculoskeletal: He exhibits tenderness.       Right shoulder: He exhibits tenderness and laceration. He exhibits normal range of motion, no bony tenderness, no pain and normal pulse.       Arms: Neurological: He is alert and oriented to person, place, and time. He has normal strength. No cranial nerve deficit or sensory deficit. He exhibits normal muscle tone. Coordination normal.  Skin: Laceration noted. Rash is not pustular.  Psychiatric: His speech is normal. His affect is labile. He is actively hallucinating. Thought content is delusional. Cognition and memory are impaired. He expresses inappropriate judgment.   Wound repair by Ms. Weber PAc       Assessment & Plan:  Wound thumb Dementia Wound care

## 2014-01-28 NOTE — Progress Notes (Signed)
Verbal consent obtained from patient. Local anesthesia with 3cc 2% lido without epi.  Wound scrubbed with soap and water and rinsed.  Wound closed with #6 5-0 Ethilon simple interrupted sutures. Wound cleansed and dressed.

## 2014-01-28 NOTE — Patient Instructions (Signed)

## 2014-01-29 ENCOUNTER — Ambulatory Visit (INDEPENDENT_AMBULATORY_CARE_PROVIDER_SITE_OTHER): Payer: Medicare Other | Admitting: Nurse Practitioner

## 2014-01-29 ENCOUNTER — Encounter: Payer: Self-pay | Admitting: Nurse Practitioner

## 2014-01-29 ENCOUNTER — Telehealth: Payer: Self-pay | Admitting: Diagnostic Neuroimaging

## 2014-01-29 VITALS — BP 145/67 | HR 59 | Ht 66.0 in | Wt 141.0 lb

## 2014-01-29 DIAGNOSIS — F03B18 Unspecified dementia, moderate, with other behavioral disturbance: Secondary | ICD-10-CM

## 2014-01-29 DIAGNOSIS — F0391 Unspecified dementia with behavioral disturbance: Secondary | ICD-10-CM

## 2014-01-29 MED ORDER — MEMANTINE HCL-DONEPEZIL HCL ER 14-10 MG PO CP24: 1.0000 | ORAL_CAPSULE | Freq: Every day | ORAL | Status: DC
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
## 2014-01-29 NOTE — Telephone Encounter (Signed)
I called back.  Spoke with Cassandra. Provided all clinical info she requested.  Says they will forward this to the review board for determination and will call the patient with outcome once decision has been made.

## 2014-01-29 NOTE — Patient Instructions (Addendum)
I think overall you are doing fairly well but I do want to suggest a few things today:  Remember to drink plenty of fluid, eat healthy meals and do not skip any meals. Try to eat protein with a every meal and eat a healthy snack such as fruit or nuts in between meals. Try to keep a regular sleep-wake schedule and try to exercise daily, particularly in the form of walking, 20-30 minutes a day, if you can. Good nutrition, proper sleep and exercise can help her cognitive function.  Engage in social activities in your community and with your family and try to keep up with current events by reading the newspaper or watching the news. If you have computer and can go online, try [email or web address]. Also, you may like to do word finding puzzles or crossword puzzles.  As far as your medications are concerned, I would like to suggest Continuing Sertraline.  Start Memantine HCl-Donepezil HCl (NAMZARIC) 14-10 MG, 1 capsule daily and stop Donepizil because it is included in [facility].   Side effects include: nausea, confusion, hallucination, personality changes. If you are having mild side effects, try to stick with the treatment as these initial side effects may go away after the first 10-14 days.     We will wean Keppra, cut dose in half, take half tabs twice a day for 1 week, then half tab once daily for 1 week, then stop.  If he has a seizure, please call the office and we will have to restart a seizure medication.  As far as diagnostic testing: none needed at this time.  Dr. Leta Baptist would like to see you back in 6 months, sooner if we need to. Please call us with any interim questions, concerns, problems, updates or refill requests.  Please also call us for any test results so we can go over those with you on the phone. My clinical assistant will answer any of your questions and relay your messages to me and also relay most of my messages to you.  Our phone number is [phone]. We also have an after hours  call service for urgent matters and there is a physician on-call for urgent questions. For any emergencies you know to call 911 or go to the nearest emergency room.

## 2014-01-29 NOTE — Telephone Encounter (Signed)
[facility] with [facility] @ [phone], Order # [id number], calling regarding prior authorization for Rx Memantine HCl-Donepezil HCl (NAMZARIC) 14-10 MG CP24.   Please call and advise.

## 2014-02-04 ENCOUNTER — Telehealth: Payer: Self-pay | Admitting: Nurse Practitioner

## 2014-02-04 NOTE — Telephone Encounter (Signed)
Patient's spouse questioning when should she give patient Memantine HCl-Donepezil HCl (NAMZARIC) 14-10 MG CP24 dosage.  She had been giving patient Donepezil at am, but dosage instructions for Namzaric states to give at night.  Questioning if she could continue giving Namzaric in am along with other medicatons.  Please call and advise @ [phone].

## 2014-02-04 NOTE — Telephone Encounter (Signed)
Patient questioning when should patient take medication, per order it states once daily, is there a certain time of the day that patient should take medication.

## 2014-02-04 NOTE — Telephone Encounter (Signed)
Please advise her to give it to him at bedtime. Thanks.

## 2014-02-06 ENCOUNTER — Ambulatory Visit (INDEPENDENT_AMBULATORY_CARE_PROVIDER_SITE_OTHER): Payer: Medicare Other | Admitting: Internal Medicine

## 2014-02-06 VITALS — BP 136/60 | HR 61 | Temp 98.4°F | Resp 20 | Ht 66.0 in | Wt 140.0 lb

## 2014-02-06 DIAGNOSIS — S61209D Unspecified open wound of unspecified finger without damage to nail, subsequent encounter: Secondary | ICD-10-CM | POA: Diagnosis not present

## 2014-02-06 NOTE — Progress Notes (Signed)
   Subjective:    Patient ID: Austin Gallagher, male    DOB: 1925/10/24, 80 y.o.   MRN: [id number]  HPI Wound healed   Review of Systems     Objective:   Physical Exam  NMSV intact Sutures removed      Assessment & Plan:  Wound healed

## 2014-02-06 NOTE — Telephone Encounter (Signed)
Called patient's wife back and left message that medication can be taken at bedtime, call back with any questions or concerns.

## 2014-02-09 ENCOUNTER — Telehealth: Payer: Self-pay

## 2014-02-09 NOTE — Telephone Encounter (Signed)
[employer] notified us they have approved our request for coverage on Namzaric effective until 01/30/2015 Ref # [id number]

## 2014-03-07 DIAGNOSIS — H2511 Age-related nuclear cataract, right eye: Secondary | ICD-10-CM | POA: Diagnosis not present

## 2014-03-07 DIAGNOSIS — Z961 Presence of intraocular lens: Secondary | ICD-10-CM | POA: Diagnosis not present

## 2014-03-10 NOTE — Progress Notes (Signed)
I reviewed note and agree with plan.   Kaylene Dawn R. Dhanush Jokerst, MD  Certified in Neurology, Neurophysiology and Neuroimaging  [facility] [address], [address] [place], [place] [address] [phone]   

## 2014-04-03 ENCOUNTER — Telehealth: Payer: Self-pay | Admitting: Diagnostic Neuroimaging

## 2014-04-03 NOTE — Telephone Encounter (Signed)
Pt's wife called back and stated do not refill the Levetiracetam they did wean him off of it.  If you have any questions please call back.

## 2014-04-03 NOTE — Telephone Encounter (Signed)
Last Ov note says: Wean Levetiracetam by cutting dose in half x 1 week, then once daily x 1 week then stop. Call if seizure occurs.  I called the patient at home.  No one was available.  Called cell, got no answer, left message.

## 2014-04-05 ENCOUNTER — Other Ambulatory Visit: Payer: Self-pay | Admitting: Nurse Practitioner

## 2014-05-08 DIAGNOSIS — G301 Alzheimer's disease with late onset: Secondary | ICD-10-CM | POA: Diagnosis not present

## 2014-07-01 DIAGNOSIS — I2510 Atherosclerotic heart disease of native coronary artery without angina pectoris: Secondary | ICD-10-CM | POA: Diagnosis not present

## 2014-07-01 DIAGNOSIS — E785 Hyperlipidemia, unspecified: Secondary | ICD-10-CM | POA: Diagnosis not present

## 2014-07-01 DIAGNOSIS — M810 Age-related osteoporosis without current pathological fracture: Secondary | ICD-10-CM | POA: Diagnosis not present

## 2014-07-01 DIAGNOSIS — Z1389 Encounter for screening for other disorder: Secondary | ICD-10-CM | POA: Diagnosis not present

## 2014-07-01 DIAGNOSIS — Z0000 Encounter for general adult medical examination without abnormal findings: Secondary | ICD-10-CM | POA: Diagnosis not present

## 2014-07-01 DIAGNOSIS — I10 Essential (primary) hypertension: Secondary | ICD-10-CM | POA: Diagnosis not present

## 2014-07-01 DIAGNOSIS — Z125 Encounter for screening for malignant neoplasm of prostate: Secondary | ICD-10-CM | POA: Diagnosis not present

## 2014-07-08 DIAGNOSIS — E785 Hyperlipidemia, unspecified: Secondary | ICD-10-CM | POA: Diagnosis not present

## 2014-07-08 DIAGNOSIS — I2510 Atherosclerotic heart disease of native coronary artery without angina pectoris: Secondary | ICD-10-CM | POA: Diagnosis not present

## 2014-07-08 DIAGNOSIS — I10 Essential (primary) hypertension: Secondary | ICD-10-CM | POA: Diagnosis not present

## 2014-07-08 DIAGNOSIS — N183 Chronic kidney disease, stage 3 (moderate): Secondary | ICD-10-CM | POA: Diagnosis not present

## 2014-07-10 DIAGNOSIS — D485 Neoplasm of uncertain behavior of skin: Secondary | ICD-10-CM | POA: Diagnosis not present

## 2014-07-10 DIAGNOSIS — C44629 Squamous cell carcinoma of skin of left upper limb, including shoulder: Secondary | ICD-10-CM | POA: Diagnosis not present

## 2014-07-10 DIAGNOSIS — Z85828 Personal history of other malignant neoplasm of skin: Secondary | ICD-10-CM | POA: Diagnosis not present

## 2014-07-17 DIAGNOSIS — C44629 Squamous cell carcinoma of skin of left upper limb, including shoulder: Secondary | ICD-10-CM | POA: Diagnosis not present

## 2014-07-17 DIAGNOSIS — Z85828 Personal history of other malignant neoplasm of skin: Secondary | ICD-10-CM | POA: Diagnosis not present

## 2014-07-29 ENCOUNTER — Encounter: Payer: Self-pay | Admitting: Diagnostic Neuroimaging

## 2014-07-29 ENCOUNTER — Ambulatory Visit (INDEPENDENT_AMBULATORY_CARE_PROVIDER_SITE_OTHER): Payer: Medicare Other | Admitting: Diagnostic Neuroimaging

## 2014-07-29 VITALS — BP 135/59 | HR 63 | Ht 66.0 in | Wt 134.4 lb

## 2014-07-29 DIAGNOSIS — R269 Unspecified abnormalities of gait and mobility: Secondary | ICD-10-CM | POA: Diagnosis not present

## 2014-07-29 DIAGNOSIS — F0391 Unspecified dementia with behavioral disturbance: Secondary | ICD-10-CM | POA: Diagnosis not present

## 2014-07-29 DIAGNOSIS — F03B18 Unspecified dementia, moderate, with other behavioral disturbance: Secondary | ICD-10-CM
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
## 2014-07-29 NOTE — Patient Instructions (Signed)
Continue current medications. 

## 2014-08-05 DIAGNOSIS — L821 Other seborrheic keratosis: Secondary | ICD-10-CM | POA: Diagnosis not present

## 2014-08-05 DIAGNOSIS — Z85828 Personal history of other malignant neoplasm of skin: Secondary | ICD-10-CM | POA: Diagnosis not present

## 2014-08-05 DIAGNOSIS — L570 Actinic keratosis: Secondary | ICD-10-CM | POA: Diagnosis not present

## 2014-10-09 ENCOUNTER — Emergency Department (HOSPITAL_COMMUNITY)
Admission: EM | Admit: 2014-10-09 | Discharge: 2014-10-09 | Disposition: A | Discharge status: Home / Self Care | Payer: Medicare Other | Attending: Emergency Medicine | Admitting: Emergency Medicine

## 2014-10-09 ENCOUNTER — Encounter (HOSPITAL_COMMUNITY): Payer: Self-pay | Admitting: Emergency Medicine

## 2014-10-09 DIAGNOSIS — F0390 Unspecified dementia without behavioral disturbance: Secondary | ICD-10-CM | POA: Diagnosis not present

## 2014-10-09 DIAGNOSIS — Z8639 Personal history of other endocrine, nutritional and metabolic disease: Secondary | ICD-10-CM | POA: Diagnosis not present

## 2014-10-09 DIAGNOSIS — I2510 Atherosclerotic heart disease of native coronary artery without angina pectoris: Secondary | ICD-10-CM | POA: Insufficient documentation

## 2014-10-09 DIAGNOSIS — Z7982 Long term (current) use of aspirin: Secondary | ICD-10-CM | POA: Diagnosis not present

## 2014-10-09 DIAGNOSIS — R4182 Altered mental status, unspecified: Secondary | ICD-10-CM | POA: Diagnosis present

## 2014-10-09 DIAGNOSIS — Z79899 Other long term (current) drug therapy: Secondary | ICD-10-CM | POA: Diagnosis not present

## 2014-10-09 DIAGNOSIS — R350 Frequency of micturition: Secondary | ICD-10-CM | POA: Insufficient documentation

## 2014-10-09 DIAGNOSIS — I10 Essential (primary) hypertension: Secondary | ICD-10-CM | POA: Insufficient documentation

## 2014-10-09 DIAGNOSIS — Z9861 Coronary angioplasty status: Secondary | ICD-10-CM | POA: Insufficient documentation

## 2014-10-09 LAB — URINALYSIS, ROUTINE W REFLEX MICROSCOPIC
Bilirubin Urine: NEGATIVE
Glucose, UA: NEGATIVE mg/dL
Hgb urine dipstick: NEGATIVE
Ketones, ur: NEGATIVE mg/dL
LEUKOCYTES UA: NEGATIVE
Nitrite: NEGATIVE
Protein, ur: NEGATIVE mg/dL
Specific Gravity, Urine: 1.022 (ref 1.005–1.030)
Urobilinogen, UA: 1 mg/dL (ref 0.0–1.0)
pH: 5.5 (ref 5.0–8.0)

## 2014-10-09 NOTE — ED Provider Notes (Signed)
CSN: [id number]     Arrival date & time 10/09/14  1308 History   First MD Initiated Contact with Patient 10/09/14 1316     Chief Complaint  Patient presents with  . Back Pain  . Altered Mental Status     (Consider location/radiation/quality/duration/timing/severity/associated sxs/prior Treatment) Patient is a 81 y.o. male presenting with frequency.  Urinary Frequency This is a new problem. Episode onset: several days. The problem occurs constantly. The problem has not changed since onset.Pertinent negatives include no chest pain, no abdominal pain and no shortness of breath. Associated symptoms comments: Occasional low back pain.  Increasing confusion over past many weeks.. Nothing aggravates the symptoms. Nothing relieves the symptoms.    Past Medical History  Diagnosis Date  . CAD (coronary artery disease)     Stent to RCA in 2005  . Hyperlipidemia   . Essential hypertension, benign   . CORONARY ATHEROSCLEROSIS, NATIVE VESSEL   . Dementia    Past Surgical History  Procedure Laterality Date  . Replacement total knee bilateral    . Inguinal hernia repair    . Coronary angioplasty with stent placement  2005   History reviewed. No pertinent family history. History  Substance Use Topics  . Smoking status: Never Smoker   . Smokeless tobacco: Not on file  . Alcohol Use: No    Review of Systems  Respiratory: Negative for shortness of breath.   Cardiovascular: Negative for chest pain.  Gastrointestinal: Negative for abdominal pain.  Genitourinary: Positive for frequency.  All other systems reviewed and are negative.     Allergies  Amlodipine  Home Medications   Prior to Admission medications   Medication Sig Start Date End Date Taking? Authorizing Provider  ALPRAZolam (XANAX) 0.25 MG tablet Take 0.125-0.25 mg by mouth 2 (two) times daily as needed for anxiety or sleep.  01/11/14  Yes Historical Provider, MD  aspirin 81 MG tablet Take 81 mg by mouth every morning.     Yes Historical Provider, MD  Cholecalciferol 1000 UNITS capsule Take 1,000 Units by mouth every morning.    Yes Historical Provider, MD  donepezil (ARICEPT) 10 MG tablet Take 10 mg by mouth daily.  06/02/14  Yes Historical Provider, MD  metoprolol succinate (TOPROL-XL) 25 MG 24 hr tablet Take 12.5 mg by mouth every morning.   Yes Historical Provider, MD  sertraline (ZOLOFT) 25 MG tablet Take 25 mg by mouth daily.  01/15/14  Yes Historical Provider, MD  valsartan-hydrochlorothiazide (DIOVAN-HCT) 320-12.5 MG per tablet Take 1 tablet by mouth daily.  11/20/13  Yes Historical Provider, MD   BP 163/69 mmHg  Pulse 67  Temp(Src) 98 F (36.7 C) (Oral)  Resp 18  SpO2 99% Physical Exam  Constitutional: He appears well-developed and well-nourished. No distress.  HENT:  Head: Normocephalic and atraumatic.  Mouth/Throat: Oropharynx is clear and moist.  Eyes: Conjunctivae are normal. Pupils are equal, round, and reactive to light. No scleral icterus.  Neck: Neck supple.  Cardiovascular: Normal rate, regular rhythm, normal heart sounds and intact distal pulses.   No murmur heard. Pulmonary/Chest: Effort normal and breath sounds normal. No stridor. No respiratory distress. He has no wheezes. He has no rales.  Abdominal: Soft. He exhibits no distension. There is no tenderness. There is no rigidity, no rebound and no guarding.  Musculoskeletal: Normal range of motion. He exhibits no edema.       Thoracic back: He exhibits no tenderness and no bony tenderness.       Lumbar back: He  exhibits no tenderness and no bony tenderness.  Neurological: He is alert. He is disoriented.  Skin: Skin is warm and dry. No rash noted.  Psychiatric: He has a normal mood and affect. His behavior is normal.  Nursing note and vitals reviewed.   ED Course  Procedures (including critical care time) Labs Review Labs Reviewed  URINALYSIS, ROUTINE W REFLEX MICROSCOPIC (NOT AT [facility])    Imaging Review No results found.    EKG Interpretation None      MDM   Final diagnoses:  Urinary frequency    81 yo male with hx of dementia presenting with family, who are concerned he may have a UTI due to urinary frequency and low back pain.  He denied any complaint or pain.  He had no evidence of UTI on UA.  His exam was reassuring.  He did not have evidence of urinary retention on bladder scan (vol 40mL).  Appeared stable for dc with outpatient follow up      Serita Grit, MD 10/09/14 1606

## 2014-10-09 NOTE — ED Notes (Signed)
Pt with dementia c/o lower medial back pain and increased confusion.

## 2014-12-02 DIAGNOSIS — L570 Actinic keratosis: Secondary | ICD-10-CM | POA: Diagnosis not present

## 2014-12-02 DIAGNOSIS — Z85828 Personal history of other malignant neoplasm of skin: Secondary | ICD-10-CM | POA: Diagnosis not present

## 2014-12-02 DIAGNOSIS — D1801 Hemangioma of skin and subcutaneous tissue: Secondary | ICD-10-CM | POA: Diagnosis not present

## 2014-12-30 DIAGNOSIS — I10 Essential (primary) hypertension: Secondary | ICD-10-CM | POA: Diagnosis not present

## 2014-12-30 DIAGNOSIS — M810 Age-related osteoporosis without current pathological fracture: Secondary | ICD-10-CM | POA: Diagnosis not present

## 2014-12-30 DIAGNOSIS — E559 Vitamin D deficiency, unspecified: Secondary | ICD-10-CM | POA: Diagnosis not present

## 2014-12-30 DIAGNOSIS — E785 Hyperlipidemia, unspecified: Secondary | ICD-10-CM | POA: Diagnosis not present

## 2015-01-09 DIAGNOSIS — Z23 Encounter for immunization: Secondary | ICD-10-CM | POA: Diagnosis not present

## 2015-03-10 DIAGNOSIS — H2511 Age-related nuclear cataract, right eye: Secondary | ICD-10-CM | POA: Diagnosis not present

## 2015-05-20 DIAGNOSIS — M353 Polymyalgia rheumatica: Secondary | ICD-10-CM | POA: Diagnosis not present

## 2015-05-20 DIAGNOSIS — I209 Angina pectoris, unspecified: Secondary | ICD-10-CM | POA: Diagnosis not present

## 2015-05-20 DIAGNOSIS — E785 Hyperlipidemia, unspecified: Secondary | ICD-10-CM | POA: Diagnosis not present

## 2015-05-20 DIAGNOSIS — F3340 Major depressive disorder, recurrent, in remission, unspecified: Secondary | ICD-10-CM | POA: Diagnosis not present

## 2015-05-25 DIAGNOSIS — Z201 Contact with and (suspected) exposure to tuberculosis: Secondary | ICD-10-CM | POA: Diagnosis not present

## 2015-06-01 DIAGNOSIS — D692 Other nonthrombocytopenic purpura: Secondary | ICD-10-CM | POA: Diagnosis not present

## 2015-06-01 DIAGNOSIS — D225 Melanocytic nevi of trunk: Secondary | ICD-10-CM | POA: Diagnosis not present

## 2015-06-01 DIAGNOSIS — D2261 Melanocytic nevi of right upper limb, including shoulder: Secondary | ICD-10-CM | POA: Diagnosis not present

## 2015-06-01 DIAGNOSIS — D1801 Hemangioma of skin and subcutaneous tissue: Secondary | ICD-10-CM | POA: Diagnosis not present

## 2015-06-01 DIAGNOSIS — L570 Actinic keratosis: Secondary | ICD-10-CM | POA: Diagnosis not present

## 2015-06-01 DIAGNOSIS — Z85828 Personal history of other malignant neoplasm of skin: Secondary | ICD-10-CM | POA: Diagnosis not present

## 2015-06-01 DIAGNOSIS — L821 Other seborrheic keratosis: Secondary | ICD-10-CM | POA: Diagnosis not present

## 2015-09-14 ENCOUNTER — Encounter (HOSPITAL_COMMUNITY): Payer: Self-pay | Admitting: Emergency Medicine

## 2015-09-14 ENCOUNTER — Emergency Department (HOSPITAL_COMMUNITY): Payer: Medicare Other

## 2015-09-14 ENCOUNTER — Inpatient Hospital Stay (HOSPITAL_COMMUNITY)
Admission: EM | Admit: 2015-09-14 | Discharge: 2015-09-18 | DRG: 481 | Disposition: A | Discharge status: Home / Self Care | Payer: Medicare Other | Attending: Internal Medicine | Admitting: Internal Medicine

## 2015-09-14 DIAGNOSIS — S79911A Unspecified injury of right hip, initial encounter: Secondary | ICD-10-CM | POA: Diagnosis not present

## 2015-09-14 DIAGNOSIS — F329 Major depressive disorder, single episode, unspecified: Secondary | ICD-10-CM | POA: Diagnosis present

## 2015-09-14 DIAGNOSIS — I2510 Atherosclerotic heart disease of native coronary artery without angina pectoris: Secondary | ICD-10-CM | POA: Diagnosis present

## 2015-09-14 DIAGNOSIS — S72001A Fracture of unspecified part of neck of right femur, initial encounter for closed fracture: Secondary | ICD-10-CM

## 2015-09-14 DIAGNOSIS — N189 Chronic kidney disease, unspecified: Secondary | ICD-10-CM | POA: Diagnosis not present

## 2015-09-14 DIAGNOSIS — Z419 Encounter for procedure for purposes other than remedying health state, unspecified: Secondary | ICD-10-CM

## 2015-09-14 DIAGNOSIS — Z96653 Presence of artificial knee joint, bilateral: Secondary | ICD-10-CM | POA: Diagnosis present

## 2015-09-14 DIAGNOSIS — S72041A Displaced fracture of base of neck of right femur, initial encounter for closed fracture: Secondary | ICD-10-CM | POA: Diagnosis not present

## 2015-09-14 DIAGNOSIS — S7291XA Unspecified fracture of right femur, initial encounter for closed fracture: Secondary | ICD-10-CM

## 2015-09-14 DIAGNOSIS — M25551 Pain in right hip: Secondary | ICD-10-CM | POA: Diagnosis present

## 2015-09-14 DIAGNOSIS — Y92129 Unspecified place in nursing home as the place of occurrence of the external cause: Secondary | ICD-10-CM

## 2015-09-14 DIAGNOSIS — S72011A Unspecified intracapsular fracture of right femur, initial encounter for closed fracture: Secondary | ICD-10-CM | POA: Diagnosis not present

## 2015-09-14 DIAGNOSIS — S72009A Fracture of unspecified part of neck of unspecified femur, initial encounter for closed fracture: Secondary | ICD-10-CM | POA: Diagnosis present

## 2015-09-14 DIAGNOSIS — Z4789 Encounter for other orthopedic aftercare: Secondary | ICD-10-CM | POA: Diagnosis not present

## 2015-09-14 DIAGNOSIS — S51011A Laceration without foreign body of right elbow, initial encounter: Secondary | ICD-10-CM | POA: Diagnosis present

## 2015-09-14 DIAGNOSIS — Z9181 History of falling: Secondary | ICD-10-CM

## 2015-09-14 DIAGNOSIS — I10 Essential (primary) hypertension: Secondary | ICD-10-CM | POA: Diagnosis present

## 2015-09-14 DIAGNOSIS — W1830XA Fall on same level, unspecified, initial encounter: Secondary | ICD-10-CM | POA: Diagnosis present

## 2015-09-14 DIAGNOSIS — R41841 Cognitive communication deficit: Secondary | ICD-10-CM | POA: Diagnosis not present

## 2015-09-14 DIAGNOSIS — R339 Retention of urine, unspecified: Secondary | ICD-10-CM | POA: Diagnosis not present

## 2015-09-14 DIAGNOSIS — E785 Hyperlipidemia, unspecified: Secondary | ICD-10-CM | POA: Diagnosis present

## 2015-09-14 DIAGNOSIS — F03B18 Unspecified dementia, moderate, with other behavioral disturbance: Secondary | ICD-10-CM | POA: Diagnosis present

## 2015-09-14 DIAGNOSIS — R338 Other retention of urine: Secondary | ICD-10-CM | POA: Diagnosis not present

## 2015-09-14 DIAGNOSIS — S7290XA Unspecified fracture of unspecified femur, initial encounter for closed fracture: Secondary | ICD-10-CM | POA: Diagnosis present

## 2015-09-14 DIAGNOSIS — N179 Acute kidney failure, unspecified: Secondary | ICD-10-CM | POA: Diagnosis present

## 2015-09-14 DIAGNOSIS — F0391 Unspecified dementia with behavioral disturbance: Secondary | ICD-10-CM | POA: Diagnosis present

## 2015-09-14 DIAGNOSIS — Z955 Presence of coronary angioplasty implant and graft: Secondary | ICD-10-CM

## 2015-09-14 DIAGNOSIS — R262 Difficulty in walking, not elsewhere classified: Secondary | ICD-10-CM | POA: Diagnosis not present

## 2015-09-14 DIAGNOSIS — S72001D Fracture of unspecified part of neck of right femur, subsequent encounter for closed fracture with routine healing: Secondary | ICD-10-CM | POA: Diagnosis not present

## 2015-09-14 DIAGNOSIS — M6281 Muscle weakness (generalized): Secondary | ICD-10-CM | POA: Diagnosis not present

## 2015-09-14 DIAGNOSIS — I129 Hypertensive chronic kidney disease with stage 1 through stage 4 chronic kidney disease, or unspecified chronic kidney disease: Secondary | ICD-10-CM | POA: Diagnosis not present

## 2015-09-14 LAB — BASIC METABOLIC PANEL
ANION GAP: 9 (ref 5–15)
Anion gap: 8 (ref 5–15)
BUN: 27 mg/dL — AB (ref 6–20)
BUN: 26 mg/dL — ABNORMAL HIGH (ref 6–20)
CALCIUM: 9.3 mg/dL (ref 8.9–10.3)
CALCIUM: 8.9 mg/dL (ref 8.9–10.3)
CO2: 26 mmol/L (ref 22–32)
CO2: 24 mmol/L (ref 22–32)
CREATININE: 1.29 mg/dL — AB (ref 0.61–1.24)
Chloride: 97 mmol/L — ABNORMAL LOW (ref 101–111)
Chloride: 96 mmol/L — ABNORMAL LOW (ref 101–111)
Creatinine, Ser: 1.44 mg/dL — ABNORMAL HIGH (ref 0.61–1.24)
GFR calc Af Amer: 55 mL/min — ABNORMAL LOW (ref >60)
GFR, EST AFRICAN AMERICAN: 48 mL/min — AB (ref >60)
GFR, EST NON AFRICAN AMERICAN: 48 mL/min — AB (ref >60)
GFR, EST NON AFRICAN AMERICAN: 42 mL/min — AB (ref >60)
GLUCOSE: 158 mg/dL — AB (ref 65–99)
Glucose, Bld: 115 mg/dL — ABNORMAL HIGH (ref 65–99)
POTASSIUM: 4.9 mmol/L (ref 3.5–5.1)
Potassium: 3.9 mmol/L (ref 3.5–5.1)
SODIUM: 131 mmol/L — AB (ref 135–145)
Sodium: 129 mmol/L — ABNORMAL LOW (ref 135–145)

## 2015-09-14 LAB — CBC
HCT: 35.3 % — ABNORMAL LOW (ref 39.0–52.0)
Hemoglobin: 12.5 g/dL — ABNORMAL LOW (ref 13.0–17.0)
MCH: 32.5 pg (ref 26.0–34.0)
MCHC: 35.4 g/dL (ref 30.0–36.0)
MCV: 91.7 fL (ref 78.0–100.0)
PLATELETS: 232 10*3/uL (ref 150–400)
RBC: 3.85 MIL/uL — ABNORMAL LOW (ref 4.22–5.81)
RDW: 12.2 % (ref 11.5–15.5)
WBC: 14.5 10*3/uL — AB (ref 4.0–10.5)

## 2015-09-14 LAB — CBC WITH DIFFERENTIAL/PLATELET
Basophils Absolute: 0 10*3/uL (ref 0.0–0.1)
Basophils Relative: 0 %
EOS ABS: 0.1 10*3/uL (ref 0.0–0.7)
EOS PCT: 1 %
HCT: 36.7 % — ABNORMAL LOW (ref 39.0–52.0)
Hemoglobin: 12.8 g/dL — ABNORMAL LOW (ref 13.0–17.0)
LYMPHS ABS: 1.2 10*3/uL (ref 0.7–4.0)
Lymphocytes Relative: 8 %
MCH: 32.3 pg (ref 26.0–34.0)
MCHC: 34.9 g/dL (ref 30.0–36.0)
MCV: 92.7 fL (ref 78.0–100.0)
Monocytes Absolute: 0.8 10*3/uL (ref 0.1–1.0)
Monocytes Relative: 6 %
Neutro Abs: 12.7 10*3/uL — ABNORMAL HIGH (ref 1.7–7.7)
Neutrophils Relative %: 85 %
PLATELETS: 232 10*3/uL (ref 150–400)
RBC: 3.96 MIL/uL — AB (ref 4.22–5.81)
RDW: 12.3 % (ref 11.5–15.5)
WBC: 14.9 10*3/uL — AB (ref 4.0–10.5)

## 2015-09-14 LAB — APTT: APTT: 32 s (ref 24–37)

## 2015-09-14 LAB — PROTIME-INR
INR: 1.14 (ref 0.00–1.49)
Prothrombin Time: 14.8 seconds (ref 11.6–15.2)

## 2015-09-14 MED ORDER — MORPHINE SULFATE (PF) 2 MG/ML IV SOLN
2.0000 mg | INTRAVENOUS | Status: DC | PRN
  Administered 2015-09-14 – 2015-09-16 (×5): 2 mg via INTRAVENOUS
  Filled 2015-09-14 (×3): qty 1

## 2015-09-14 MED ORDER — POVIDONE-IODINE 10 % EX SWAB: 2.0000 "application " | Freq: Once | CUTANEOUS | Status: DC

## 2015-09-14 MED ORDER — VITAMIN D 1000 UNITS PO TABS
2000.0000 [IU] | ORAL_TABLET | Freq: Every day | ORAL | Status: DC
  Administered 2015-09-15 – 2015-09-18 (×4): 2000 [IU] via ORAL
  Filled 2015-09-14 (×4): qty 2

## 2015-09-14 MED ORDER — SODIUM CHLORIDE 0.9 % IV SOLN
INTRAVENOUS | Status: DC
Start: 2015-09-14 — End: 2015-09-18
  Administered 2015-09-14: 75 mL/h via INTRAVENOUS
  Administered 2015-09-15 – 2015-09-17 (×5): via INTRAVENOUS

## 2015-09-14 MED ORDER — CEFAZOLIN SODIUM-DEXTROSE 2-4 GM/100ML-% IV SOLN
2.0000 g | INTRAVENOUS | Status: AC
  Administered 2015-09-15: 2 g via INTRAVENOUS
  Filled 2015-09-14: qty 100

## 2015-09-14 MED ORDER — ONDANSETRON HCL 4 MG/2ML IJ SOLN
4.0000 mg | Freq: Four times a day (QID) | INTRAMUSCULAR | Status: DC | PRN
  Administered 2015-09-15 – 2015-09-16 (×3): 4 mg via INTRAVENOUS
  Filled 2015-09-14 (×3): qty 2

## 2015-09-14 MED ORDER — MORPHINE SULFATE (PF) 2 MG/ML IV SOLN
INTRAVENOUS | Status: AC
  Administered 2015-09-14: 2 mg via INTRAVENOUS
  Filled 2015-09-14: qty 1

## 2015-09-14 MED ORDER — ENOXAPARIN SODIUM 40 MG/0.4ML ~~LOC~~ SOLN
40.0000 mg | SUBCUTANEOUS | Status: DC
  Administered 2015-09-14: 40 mg via SUBCUTANEOUS
  Filled 2015-09-14: qty 0.4

## 2015-09-14 MED ORDER — ALPRAZOLAM 0.25 MG PO TABS
0.2500 mg | ORAL_TABLET | Freq: Once | ORAL | Status: AC
  Administered 2015-09-14: 0.25 mg via ORAL
  Filled 2015-09-14: qty 1

## 2015-09-14 MED ORDER — PANTOPRAZOLE SODIUM 40 MG PO TBEC
40.0000 mg | DELAYED_RELEASE_TABLET | Freq: Every day | ORAL | Status: DC
  Administered 2015-09-15 – 2015-09-18 (×4): 40 mg via ORAL
  Filled 2015-09-14 (×4): qty 1

## 2015-09-14 MED ORDER — CHLORHEXIDINE GLUCONATE 4 % EX LIQD
60.0000 mL | Freq: Once | CUTANEOUS | Status: DC
  Filled 2015-09-14: qty 15

## 2015-09-14 MED ORDER — DONEPEZIL HCL 10 MG PO TABS
10.0000 mg | ORAL_TABLET | Freq: Every day | ORAL | Status: DC
  Administered 2015-09-15 – 2015-09-18 (×4): 10 mg via ORAL
  Filled 2015-09-14 (×2): qty 1
  Filled 2015-09-14: qty 2
  Filled 2015-09-14: qty 1

## 2015-09-14 MED ORDER — TRAMADOL HCL 50 MG PO TABS
50.0000 mg | ORAL_TABLET | ORAL | Status: DC | PRN
  Administered 2015-09-14 – 2015-09-18 (×5): 50 mg via ORAL
  Filled 2015-09-14 (×5): qty 1

## 2015-09-14 MED ORDER — METOPROLOL SUCCINATE ER 25 MG PO TB24
12.5000 mg | ORAL_TABLET | Freq: Every morning | ORAL | Status: DC
  Administered 2015-09-16 – 2015-09-18 (×2): 12.5 mg via ORAL
  Filled 2015-09-14 (×4): qty 1

## 2015-09-14 MED ORDER — SERTRALINE HCL 50 MG PO TABS
50.0000 mg | ORAL_TABLET | Freq: Every day | ORAL | Status: DC
  Administered 2015-09-15 – 2015-09-17 (×3): 50 mg via ORAL
  Filled 2015-09-14 (×3): qty 1

## 2015-09-14 NOTE — ED Notes (Signed)
Bed: [id number] Expected date:  Expected time:  Means of arrival:  Comments: EMS - Fall [address] [place]

## 2015-09-14 NOTE — ED Notes (Signed)
This RN paged MD Rolena Infante at [phone] to get an order for pain medication. At 1900 pt's wife came out of room on the phone with MD Rolena Infante' [place]. Wife was asking for PA to give this RN an order for pain medication. PA spoke with this RN and wanted this RN to give pt. 50mg  tramadol PRN q3 hours and said she would be there in 15 minutes. This RN spoke with charge RN about rules regarding verbal and telephone orders and instructed that narcotic pain medication is not allowed to be ordered verbally over the phone. Wife made aware that pt will have to wait until PA arrives to put in order.

## 2015-09-14 NOTE — ED Provider Notes (Signed)
CSN: [id number]     Arrival date & time 09/14/15  1142 History   First MD Initiated Contact with Patient 09/14/15 1300     Chief Complaint  Patient presents with  . Hip Pain     (Consider location/radiation/quality/duration/timing/severity/associated sxs/prior Treatment) Patient is a 82 y.o. male presenting with fall. The history is provided by the spouse.  Fall This is a new problem. The current episode started 6 to 12 hours ago. The problem occurs constantly. The problem has not changed since onset.Pertinent negatives include no chest pain and no abdominal pain. Associated symptoms comments: Right hip pain. Nothing aggravates the symptoms. Nothing relieves the symptoms. He has tried nothing for the symptoms.    Past Medical History  Diagnosis Date  . CAD (coronary artery disease)     Stent to RCA in 2005  . Hyperlipidemia   . Essential hypertension, benign   . CORONARY ATHEROSCLEROSIS, NATIVE VESSEL   . Dementia    Past Surgical History  Procedure Laterality Date  . Replacement total knee bilateral    . Inguinal hernia repair    . Coronary angioplasty with stent placement  2005   History reviewed. No pertinent family history. Social History  Substance Use Topics  . Smoking status: Never Smoker   . Smokeless tobacco: None  . Alcohol Use: No    Review of Systems  Cardiovascular: Negative for chest pain.  Gastrointestinal: Negative for abdominal pain.  All other systems reviewed and are negative.     Allergies  Ace inhibitors and Amlodipine  Home Medications   Prior to Admission medications   Medication Sig Start Date End Date Taking? Authorizing Provider  acetaminophen (TYLENOL) 500 MG tablet Take 500 mg by mouth every 6 (six) hours as needed for moderate pain or fever.   Yes Historical Provider, MD  ALPRAZolam (XANAX) 0.25 MG tablet Take 0.25 mg by mouth every evening. May take an additional 0.125mg  every 4 hours as needed for anxiety 01/11/14  Yes Historical  Provider, MD  aspirin 81 MG tablet Take 81 mg by mouth every morning.    Yes Historical Provider, MD  Cholecalciferol (VITAMIN D3) 2000 units capsule Take 2,000 Units by mouth daily.   Yes Historical Provider, MD  donepezil (ARICEPT) 10 MG tablet Take 10 mg by mouth daily.  06/02/14  Yes Historical Provider, MD  metoprolol succinate (TOPROL-XL) 25 MG 24 hr tablet Take 12.5 mg by mouth every morning.   Yes Historical Provider, MD  sertraline (ZOLOFT) 25 MG tablet Take 25 mg by mouth daily.  01/15/14  Yes Historical Provider, MD  sertraline (ZOLOFT) 50 MG tablet Take 50 mg by mouth daily.   Yes Historical Provider, MD  valsartan-hydrochlorothiazide (DIOVAN-HCT) 320-12.5 MG per tablet Take 1 tablet by mouth daily.  11/20/13  Yes Historical Provider, MD   BP 141/73 mmHg  Pulse 70  Temp(Src) 97.9 F (36.6 C) (Oral)  Resp 18  SpO2 91% Physical Exam  Constitutional: He is oriented to person, place, and time. He appears well-developed and well-nourished. No distress.  HENT:  Head: Normocephalic and atraumatic.  Eyes: Conjunctivae are normal.  Neck: Neck supple. No tracheal deviation present.  Cardiovascular: Normal rate, regular rhythm and normal heart sounds.   Pulmonary/Chest: Effort normal and breath sounds normal. No respiratory distress.  Abdominal: Soft. He exhibits no distension. There is no tenderness.  Musculoskeletal:       Right hip: He exhibits tenderness.  Neurological: He is alert and oriented to person, place, and time.  Skin:  Skin is warm and dry.  1 cm skin tear of right elbow  Psychiatric: He has a normal mood and affect.    ED Course  Procedures (including critical care time)  LACERATION REPAIR Performed by: Leo Grosser Authorized by: Leo Grosser Consent: Verbal consent obtained. Risks and benefits: risks, benefits and alternatives were discussed Consent given by: patient Patient identity confirmed: provided demographic data Prepped and Draped in normal sterile  fashion Wound explored  Laceration Location: right elbow  Laceration Length: 1cm  No Foreign Bodies seen or palpated Irrigation method: syringe Amount of cleaning: standard  Skin closure: glue  Technique: simple  Patient tolerance: Patient tolerated the procedure well with no immediate complications.   Labs Review Labs Reviewed  CBC WITH DIFFERENTIAL/PLATELET - Abnormal; Notable for the following:    WBC 14.9 (*)    RBC 3.96 (*)    Hemoglobin 12.8 (*)    HCT 36.7 (*)    Neutro Abs 12.7 (*)    All other components within normal limits  BASIC METABOLIC PANEL - Abnormal; Notable for the following:    Sodium 131 (*)    Chloride 97 (*)    Glucose, Bld 115 (*)    BUN 27 (*)    Creatinine, Ser 1.29 (*)    GFR calc non Af Amer 48 (*)    GFR calc Af Amer 55 (*)    All other components within normal limits    Imaging Review Ct Hip Right Wo Contrast  09/14/2015  CLINICAL DATA:  Status post fall from a standing position this morning. Right hip pain and difficulty walking. Initial encounter. EXAM: CT OF THE RIGHT HIP WITHOUT CONTRAST TECHNIQUE: Multidetector CT imaging of the right hip was performed according to the standard protocol. Multiplanar CT image reconstructions were also generated. COMPARISON:  Plain films the right hip earlier today. FINDINGS: The patient has a mildly impacted subcapital fracture of the right femoral neck. The fracture shows minimal anterior displacement. No other acute bony or joint abnormality is identified. The right femoral head is located. No lytic or sclerotic bony lesion is seen. There is mild degenerative disease about the right hip. Imaged intrapelvic contents demonstrate no acute abnormality. Atherosclerosis is noted. The patient is status post hernia repair. IMPRESSION: Acute, mildly impacted subcapital fracture right hip with minimal anterior displacement. Electronically Signed   By: Inge Rise M.D.   On: 09/14/2015 16:37   Dg Hip Unilat   With Pelvis 2-3 Views Right  09/14/2015  CLINICAL DATA:  [address] at nursing home today and landed on right side. Right hip pain after fall. EXAM: DG HIP (WITH OR WITHOUT PELVIS) 2-3V RIGHT COMPARISON:  No comparison studies available. FINDINGS: Frontal pelvis AP and frog-leg lateral views of the right hip show no gross fracture or dislocation. Apparent disruption of the compressive trabecula in the femoral neck raises the question of a nondisplaced fracture although this is not a definite finding and may represent superimposition of osteophytes SI joints and symphysis pubis are unremarkable. Patient has had prior right lower quadrant ventral mesh placement. IMPRESSION: Possible, but not definite nondisplaced femoral neck fracture. MRI recommended to further evaluate. Electronically Signed   By: Misty Stanley M.D.   On: 09/14/2015 13:04   I have personally reviewed and evaluated these images and lab results as part of my medical decision-making.   EKG Interpretation None      MDM   Final diagnoses:  Hip fracture, right, closed, initial encounter ([place])    82 y.o.  male presents with fall from standing earlier today and some right hip pain. XR concerning for possible fracture so CT ordered for further characterization which demonstrates minimally displaced fracture. I called o/c orthopedic group for facility and I was not aware Pt's family was calling [facility] orthopedics to have fracture evaluated. Initial plan was transfer to [facility] but [place] will see him at [facility] instead. I notified the consulting service initially consulted that another group would be managing the patient. Hospitalist was consulted for admission and will see the patient in the emergency department.     Leo Grosser, MD 09/15/15 [phone]

## 2015-09-14 NOTE — ED Notes (Signed)
Pt becoming agitated and ripping off bandages. Pt's wife requesting xanax for pt. MD made aware.

## 2015-09-14 NOTE — ED Notes (Signed)
Pt's wound closed with dermabond. Per Wife request, bandaid placed over area once dermabond dry. Once bandaid placed pt's wife angry that band aid was placed on his fragile skin.

## 2015-09-14 NOTE — Consult Note (Signed)
Patient ID: Austin Gallagher MRN: [id number] DOB/AGE: 1925/10/08 82 y.o.  Admit date: 09/14/2015  Admission Diagnoses:  Active Problems:   Closed hip fracture (HCC)   Femoral fracture (HCC)   HPI: [facility], male, 82 years of age.  Past med hx significant for dementia. Unfortunately the patient fell today at the SNF.  Acute hip pain and dysfunction was noted and the pt was brought to the ED.  Pt is a unreliable historian.  He is accompanied by his wife.  Past Medical History: Past Medical History  Diagnosis Date  . CAD (coronary artery disease)     Stent to RCA in 2005  . Hyperlipidemia   . Essential hypertension, benign   . CORONARY ATHEROSCLEROSIS, NATIVE VESSEL   . Dementia     Surgical History: Past Surgical History  Procedure Laterality Date  . Replacement total knee bilateral    . Inguinal hernia repair    . Coronary angioplasty with stent placement  2005    Family History: History reviewed. No pertinent family history.  Social History: Social History   Social History  . Marital Status: Married    Spouse Name: N/A  . Number of Children: 3  . Years of Education: college   Occupational History  . Retired [employer]   .     Social History Main Topics  . Smoking status: Never Smoker   . Smokeless tobacco: Not on file  . Alcohol Use: No  . Drug Use: No  . Sexual Activity: No   Other Topics Concern  . Not on file   Social History Narrative   Patient is married with 3 children.   Patient is left handed.   Patient has college education.   Patient drinks 2-3 cups daily.            He has no history of coronary artery disease in his siblings.                Allergies: Ace inhibitors and Amlodipine  Medications: I have reviewed the patient's current medications.  Vital Signs: Patient Vitals for the past 24 hrs:  BP Temp Temp src Pulse Resp SpO2  09/14/15 1735 146/78 mmHg - - 80 20 90 %  09/14/15 1414 141/73 mmHg - - 70 18 91 %    09/14/15 1248 - - - - - 100 %  09/14/15 1157 - - - - - 98 %  09/14/15 1154 132/66 mmHg 97.9 F (36.6 C) Oral 60 18 (!) 85 %  09/14/15 1150 - - - - - 98 %    Radiology: Ct Hip Right Wo Contrast  09/14/2015  CLINICAL DATA:  Status post fall from a standing position this morning. Right hip pain and difficulty walking. Initial encounter. EXAM: CT OF THE RIGHT HIP WITHOUT CONTRAST TECHNIQUE: Multidetector CT imaging of the right hip was performed according to the standard protocol. Multiplanar CT image reconstructions were also generated. COMPARISON:  Plain films the right hip earlier today. FINDINGS: The patient has a mildly impacted subcapital fracture of the right femoral neck. The fracture shows minimal anterior displacement. No other acute bony or joint abnormality is identified. The right femoral head is located. No lytic or sclerotic bony lesion is seen. There is mild degenerative disease about the right hip. Imaged intrapelvic contents demonstrate no acute abnormality. Atherosclerosis is noted. The patient is status post hernia repair. IMPRESSION: Acute, mildly impacted subcapital fracture right hip with minimal anterior displacement. Electronically Signed   By: Marcello Moores  Dalessio M.D.   On: 09/14/2015 16:37   Dg Hip Unilat  With Pelvis 2-3 Views Right  09/14/2015  CLINICAL DATA:  [address] at nursing home today and landed on right side. Right hip pain after fall. EXAM: DG HIP (WITH OR WITHOUT PELVIS) 2-3V RIGHT COMPARISON:  No comparison studies available. FINDINGS: Frontal pelvis AP and frog-leg lateral views of the right hip show no gross fracture or dislocation. Apparent disruption of the compressive trabecula in the femoral neck raises the question of a nondisplaced fracture although this is not a definite finding and may represent superimposition of osteophytes SI joints and symphysis pubis are unremarkable. Patient has had prior right lower quadrant ventral mesh placement. IMPRESSION: Possible, but  not definite nondisplaced femoral neck fracture. MRI recommended to further evaluate. Electronically Signed   By: Misty Stanley M.D.   On: 09/14/2015 13:04    Labs:  Recent Labs  09/14/15 1421  WBC 14.9*  RBC 3.96*  HCT 36.7*  PLT 232    Recent Labs  09/14/15 1421  NA 131*  K 4.9  CL 97*  CO2 26  BUN 27*  CREATININE 1.29*  GLUCOSE 115*  CALCIUM 9.3   No results for input(s): LABPT, INR in the last 72 hours.  Review of Systems: ROS  Physical Exam: ABD soft Sensation intact distally Compartment soft  Pt has dementia Pain of the right hip noted  Assessment and Plan: Dr Rolena Infante consulted with Dr Alvan Dame Pt will have surgery with Dr. Alvan Dame tomorrow NPO after midnight Pt will be admitted to the floor - pain management Wrote script for tramadol     Ronette Deter, PA-C [place] [phone]

## 2015-09-14 NOTE — ED Notes (Addendum)
Per EMS, pt from [facility]. Pt had witnessed fall from standing onto bottom at 700. Pt denies any pain at this time. Ambulatory without difficulty. Pt presents to ED with wife who states "pt at one time complained of right hip pain."  No tenderness to palpation, rotation or shortening noted. Hx of dementia.

## 2015-09-14 NOTE — ED Notes (Signed)
Patient transported to CT 

## 2015-09-14 NOTE — H&P (Signed)
History and Physical  Austin Gallagher [id number] DOB: 02/27/1926 DOA: 09/14/2015  PCP:  Thressa Sheller, MD   Chief Complaint:  Fall  History of Present Illness: History taken from family Patient is a 82 yo male with history of CAD s/p stent in 2005, Dementia, HTN, HLD who was brought family as he tripped today while trying to stand up. He fell backward landing on his bottom. He did not have any complaints prior to that and did loose consciousness. He has history of falls for the past few years. No dizziness, palpitations, chest pain, dyspnea or vertigo. Currently he complains of hip pain.  Review of Systems:  CONSTITUTIONAL:     No night sweats.  No fatigue.  No fever. No chills. Eyes:                            No visual changes.  No eye pain.  No eye discharge.   ENT:                              No epistaxis.  No sinus pain.  No sore throat.   No congestion. RESPIRATORY:           No cough.  No wheeze.  No hemoptysis.  No dyspnea CARDIOVASCULAR   :  No chest pains.  No palpitations. GASTROINTESTINAL:  No abdominal pain.  No nausea. No vomiting.  No diarrhea. No constipation.  No hematemesis.  No hematochezia.  No melena. GENITOURINARY:      No urgency.  No frequency.  No dysuria.  No hematuria.  No obstructive symptoms.  No discharge.  No pain. MUSCULOSKELETAL:  +musculoskeletal pain.  No joint swelling.  No arthritis. NEUROLOGICAL:        +confusion.  +weakness. No headache. No seizure. PSYCHIATRIC:             No depression. No anxiety. No suicidal ideation. SKIN:                             No rashes.  No lesions.  No wounds. ENDOCRINE:                No weight loss.  No polydipsia.  No polyuria.  No polyphagia. HEMATOLOGIC:           No purpura.  No petechiae.  No bleeding.  ALLERGIC                 : No pruritus.  No angioedema Other:  Past Medical and Surgical History:   Past Medical History  Diagnosis Date  . CAD (coronary artery disease)     Stent to RCA in  2005  . Hyperlipidemia   . Essential hypertension, benign   . CORONARY ATHEROSCLEROSIS, NATIVE VESSEL   . Dementia    Past Surgical History  Procedure Laterality Date  . Replacement total knee bilateral    . Inguinal hernia repair    . Coronary angioplasty with stent placement  2005    Social History:   reports that he has never smoked. He does not have any smokeless tobacco history on file. He reports that he does not drink alcohol or use illicit drugs.    Allergies  Allergen Reactions  . Ace Inhibitors     Documented on MAR  . Amlodipine Other (See Comments)    REACTION: ankles itching  History reviewed. No pertinent family history.    Prior to Admission medications   Medication Sig Start Date End Date Taking? Authorizing Provider  acetaminophen (TYLENOL) 500 MG tablet Take 500 mg by mouth every 6 (six) hours as needed for moderate pain or fever.   Yes Historical Provider, MD  ALPRAZolam (XANAX) 0.25 MG tablet Take 0.25 mg by mouth every evening. May take an additional 0.125mg  every 4 hours as needed for anxiety 01/11/14  Yes Historical Provider, MD  aspirin 81 MG tablet Take 81 mg by mouth every morning.    Yes Historical Provider, MD  Cholecalciferol (VITAMIN D3) 2000 units capsule Take 2,000 Units by mouth daily.   Yes Historical Provider, MD  donepezil (ARICEPT) 10 MG tablet Take 10 mg by mouth daily.  06/02/14  Yes Historical Provider, MD  metoprolol succinate (TOPROL-XL) 25 MG 24 hr tablet Take 12.5 mg by mouth every morning.   Yes Historical Provider, MD  sertraline (ZOLOFT) 25 MG tablet Take 25 mg by mouth daily.  01/15/14  Yes Historical Provider, MD  sertraline (ZOLOFT) 50 MG tablet Take 50 mg by mouth daily.   Yes Historical Provider, MD  valsartan-hydrochlorothiazide (DIOVAN-HCT) 320-12.5 MG per tablet Take 1 tablet by mouth daily.  11/20/13  Yes Historical Provider, MD    Physical Exam: BP 146/78 mmHg  Pulse 80  Temp(Src) 97.9 F (36.6 C) (Oral)  Resp 20   SpO2 90%  GENERAL :   Alert and cooperative, and appears to be in no acute distress. HEAD:           normocephalic. EYES:            PERRL, EOMI.  vision is grossly intact. EARS:           hearing grossly intact. NOSE:           No nasal discharge. THROAT:     Oral cavity and pharynx : dry membranes  NECK:          supple CARDIAC:    Normal S1 and S2. No gallop. No murmurs.  Vascular:     no peripheral edema. Marland Kitchen LUNGS:       Clear to auscultation  ABDOMEN: Positive bowel sounds. Soft, nondistended, nontender. No guarding or rebound.      MSK:          R.hip tenderness. EXT           : No significant deformity Neuro        : Alert, oriented to person, place, and time.                      CN II-XII intact.                       Intact sensation in all extremities. Did not assess strength in R.leg due to pain. SKIN:            No rash. No lesions.           Labs on Admission:  Reviewed.   Radiological Exams on Admission: Ct Hip Right Wo Contrast  09/14/2015  CLINICAL DATA:  Status post fall from a standing position this morning. Right hip pain and difficulty walking. Initial encounter. EXAM: CT OF THE RIGHT HIP WITHOUT CONTRAST TECHNIQUE: Multidetector CT imaging of the right hip was performed according to the standard protocol. Multiplanar CT image reconstructions were also generated. COMPARISON:  Plain films the right hip earlier today. FINDINGS:  The patient has a mildly impacted subcapital fracture of the right femoral neck. The fracture shows minimal anterior displacement. No other acute bony or joint abnormality is identified. The right femoral head is located. No lytic or sclerotic bony lesion is seen. There is mild degenerative disease about the right hip. Imaged intrapelvic contents demonstrate no acute abnormality. Atherosclerosis is noted. The patient is status post hernia repair. IMPRESSION: Acute, mildly impacted subcapital fracture right hip with minimal anterior displacement.  Electronically Signed   By: Inge Rise M.D.   On: 09/14/2015 16:37   Dg Hip Unilat  With Pelvis 2-3 Views Right  09/14/2015  CLINICAL DATA:  [place] at nursing home today and landed on right side. Right hip pain after fall. EXAM: DG HIP (WITH OR WITHOUT PELVIS) 2-3V RIGHT COMPARISON:  No comparison studies available. FINDINGS: Frontal pelvis AP and frog-leg lateral views of the right hip show no gross fracture or dislocation. Apparent disruption of the compressive trabecula in the femoral neck raises the question of a nondisplaced fracture although this is not a definite finding and may represent superimposition of osteophytes SI joints and symphysis pubis are unremarkable. Patient has had prior right lower quadrant ventral mesh placement. IMPRESSION: Possible, but not definite nondisplaced femoral neck fracture. MRI recommended to further evaluate. Electronically Signed   By: Misty Stanley M.D.   On: 09/14/2015 13:04      Assessment/Plan  Fall:Likely mechanical. Need PT before discharge.   R.femoral neck fracture:  due to above. Non displaced.  Orhto consulted.  NPO after MN for surgery.  Will hold asp. Morphine prn pain  AKI vs progressive CDK:  Likely AKI on CDK Will hold Dovan Start IVF at 30 cc/hr  CAD:  Continue asp and BB.   HTN:  Continue BB,hold Diovan.   Dementia: cont Aricept  Xanax and Zoloft given depression/paranoia per family. Holding Xanax for tonight unless needed. He got dose in the ER.   Input & Output:  NA Lines & Tubes: PIV DVT prophylaxis:  [place] enoxaparin  GI prophylaxis: PPI Consultants: Ortho  Code Status: Full Family Communication:  At bedside  Disposition Plan: admit medsurg    Gennaro Africa M.D [facility]

## 2015-09-15 ENCOUNTER — Inpatient Hospital Stay (HOSPITAL_COMMUNITY): Payer: Medicare Other

## 2015-09-15 ENCOUNTER — Inpatient Hospital Stay (HOSPITAL_COMMUNITY): Payer: Medicare Other | Admitting: Anesthesiology

## 2015-09-15 ENCOUNTER — Encounter (HOSPITAL_COMMUNITY)
Admission: EM | Disposition: A | Discharge status: Home / Self Care | Payer: Self-pay | Source: Home / Self Care | Attending: Internal Medicine

## 2015-09-15 DIAGNOSIS — R338 Other retention of urine: Secondary | ICD-10-CM

## 2015-09-15 HISTORY — PX: HIP PINNING,CANNULATED: SHX1758

## 2015-09-15 LAB — BASIC METABOLIC PANEL
Anion gap: 8 (ref 5–15)
BUN: 34 mg/dL — AB (ref 6–20)
CALCIUM: 8.6 mg/dL — AB (ref 8.9–10.3)
CO2: 24 mmol/L (ref 22–32)
Chloride: 97 mmol/L — ABNORMAL LOW (ref 101–111)
Creatinine, Ser: 2.04 mg/dL — ABNORMAL HIGH (ref 0.61–1.24)
GFR calc Af Amer: 32 mL/min — ABNORMAL LOW (ref >60)
GFR, EST NON AFRICAN AMERICAN: 27 mL/min — AB (ref >60)
GLUCOSE: 141 mg/dL — AB (ref 65–99)
POTASSIUM: 4.1 mmol/L (ref 3.5–5.1)
Sodium: 129 mmol/L — ABNORMAL LOW (ref 135–145)

## 2015-09-15 LAB — CBC WITH DIFFERENTIAL/PLATELET
BASOS ABS: 0 10*3/uL (ref 0.0–0.1)
Basophils Relative: 0 %
EOS PCT: 0 %
Eosinophils Absolute: 0 10*3/uL (ref 0.0–0.7)
HCT: 32.3 % — ABNORMAL LOW (ref 39.0–52.0)
Hemoglobin: 11.4 g/dL — ABNORMAL LOW (ref 13.0–17.0)
LYMPHS PCT: 6 %
Lymphs Abs: 0.8 10*3/uL (ref 0.7–4.0)
MCH: 33.2 pg (ref 26.0–34.0)
MCHC: 35.3 g/dL (ref 30.0–36.0)
MCV: 94.2 fL (ref 78.0–100.0)
MONO ABS: 0.8 10*3/uL (ref 0.1–1.0)
Monocytes Relative: 7 %
Neutro Abs: 10.3 10*3/uL — ABNORMAL HIGH (ref 1.7–7.7)
Neutrophils Relative %: 87 %
PLATELETS: 205 10*3/uL (ref 150–400)
RBC: 3.43 MIL/uL — ABNORMAL LOW (ref 4.22–5.81)
RDW: 12.7 % (ref 11.5–15.5)
WBC: 11.8 10*3/uL — ABNORMAL HIGH (ref 4.0–10.5)

## 2015-09-15 LAB — SURGICAL PCR SCREEN
MRSA, PCR: NEGATIVE
Staphylococcus aureus: NEGATIVE

## 2015-09-15 SURGERY — FIXATION, FEMUR, NECK, PERCUTANEOUS, USING SCREW
Anesthesia: General | Site: Hip | Laterality: Right

## 2015-09-15 MED ORDER — MAGNESIUM CITRATE PO SOLN: 1.0000 | Freq: Once | ORAL | Status: DC | PRN

## 2015-09-15 MED ORDER — FERROUS SULFATE 325 (65 FE) MG PO TABS
325.0000 mg | ORAL_TABLET | Freq: Three times a day (TID) | ORAL | Status: DC
  Administered 2015-09-16 – 2015-09-18 (×6): 325 mg via ORAL
  Filled 2015-09-15 (×6): qty 1

## 2015-09-15 MED ORDER — CEFAZOLIN SODIUM-DEXTROSE 2-4 GM/100ML-% IV SOLN
INTRAVENOUS | Status: AC
  Filled 2015-09-15: qty 100

## 2015-09-15 MED ORDER — FENTANYL CITRATE (PF) 100 MCG/2ML IJ SOLN
INTRAMUSCULAR | Status: AC
  Filled 2015-09-15: qty 2

## 2015-09-15 MED ORDER — DOCUSATE SODIUM 100 MG PO CAPS
100.0000 mg | ORAL_CAPSULE | Freq: Two times a day (BID) | ORAL | Status: DC
  Administered 2015-09-16 – 2015-09-18 (×5): 100 mg via ORAL
  Filled 2015-09-15 (×5): qty 1

## 2015-09-15 MED ORDER — METOCLOPRAMIDE HCL 5 MG/ML IJ SOLN: 5.0000 mg | Freq: Three times a day (TID) | INTRAMUSCULAR | Status: DC | PRN

## 2015-09-15 MED ORDER — PROPOFOL 10 MG/ML IV BOLUS
INTRAVENOUS | Status: DC | PRN
  Administered 2015-09-15: 120 mg via INTRAVENOUS

## 2015-09-15 MED ORDER — LIDOCAINE HCL (CARDIAC) 20 MG/ML IV SOLN
INTRAVENOUS | Status: AC
  Filled 2015-09-15: qty 5

## 2015-09-15 MED ORDER — FENTANYL CITRATE (PF) 100 MCG/2ML IJ SOLN
INTRAMUSCULAR | Status: DC | PRN
  Administered 2015-09-15 (×2): 50 ug via INTRAVENOUS

## 2015-09-15 MED ORDER — TAMSULOSIN HCL 0.4 MG PO CAPS
0.4000 mg | ORAL_CAPSULE | Freq: Every day | ORAL | Status: DC
  Administered 2015-09-16 – 2015-09-18 (×3): 0.4 mg via ORAL
  Filled 2015-09-15 (×3): qty 1

## 2015-09-15 MED ORDER — PROPOFOL 10 MG/ML IV BOLUS
INTRAVENOUS | Status: AC
  Filled 2015-09-15: qty 20

## 2015-09-15 MED ORDER — PHENOL 1.4 % MT LIQD
1.0000 | OROMUCOSAL | Status: DC | PRN
Start: 2015-09-15 — End: 2015-09-18

## 2015-09-15 MED ORDER — LIDOCAINE HCL (CARDIAC) 20 MG/ML IV SOLN
INTRAVENOUS | Status: DC | PRN
  Administered 2015-09-15: 50 mg via INTRATRACHEAL

## 2015-09-15 MED ORDER — ONDANSETRON HCL 4 MG/2ML IJ SOLN
INTRAMUSCULAR | Status: DC | PRN
  Administered 2015-09-15: 4 mg via INTRAVENOUS

## 2015-09-15 MED ORDER — POLYETHYLENE GLYCOL 3350 17 G PO PACK
17.0000 g | PACK | Freq: Every day | ORAL | Status: DC | PRN
  Administered 2015-09-17: 17 g via ORAL
  Filled 2015-09-15: qty 1

## 2015-09-15 MED ORDER — LACTATED RINGERS IV SOLN: INTRAVENOUS | Status: DC

## 2015-09-15 MED ORDER — ONDANSETRON HCL 4 MG/2ML IJ SOLN
INTRAMUSCULAR | Status: AC
  Filled 2015-09-15: qty 2

## 2015-09-15 MED ORDER — 0.9 % SODIUM CHLORIDE (POUR BTL) OPTIME
TOPICAL | Status: DC | PRN
  Administered 2015-09-15: 1000 mL

## 2015-09-15 MED ORDER — EPHEDRINE SULFATE 50 MG/ML IJ SOLN
INTRAMUSCULAR | Status: DC | PRN
  Administered 2015-09-15: 10 mg via INTRAVENOUS
  Administered 2015-09-15: 5 mg via INTRAVENOUS
  Administered 2015-09-15: 10 mg via INTRAVENOUS

## 2015-09-15 MED ORDER — BISACODYL 10 MG RE SUPP: 10.0000 mg | Freq: Every day | RECTAL | Status: DC | PRN

## 2015-09-15 MED ORDER — CEFAZOLIN SODIUM-DEXTROSE 2-4 GM/100ML-% IV SOLN
2.0000 g | Freq: Four times a day (QID) | INTRAVENOUS | Status: AC
  Administered 2015-09-16 (×2): 2 g via INTRAVENOUS
  Filled 2015-09-15 (×2): qty 100

## 2015-09-15 MED ORDER — METOCLOPRAMIDE HCL 5 MG PO TABS: 5.0000 mg | ORAL_TABLET | Freq: Three times a day (TID) | ORAL | Status: DC | PRN

## 2015-09-15 MED ORDER — FENTANYL CITRATE (PF) 100 MCG/2ML IJ SOLN: 25.0000 ug | INTRAMUSCULAR | Status: DC | PRN

## 2015-09-15 MED ORDER — LACTATED RINGERS IV SOLN
INTRAVENOUS | Status: DC | PRN
  Administered 2015-09-15 (×2): via INTRAVENOUS

## 2015-09-15 MED ORDER — ALUM & MAG HYDROXIDE-SIMETH 200-200-20 MG/5ML PO SUSP: 30.0000 mL | ORAL | Status: DC | PRN

## 2015-09-15 MED ORDER — MENTHOL 3 MG MT LOZG: 1.0000 | LOZENGE | OROMUCOSAL | Status: DC | PRN

## 2015-09-15 MED ORDER — SUCCINYLCHOLINE CHLORIDE 20 MG/ML IJ SOLN
INTRAMUSCULAR | Status: DC | PRN
  Administered 2015-09-15: 100 mg via INTRAVENOUS

## 2015-09-15 MED ORDER — ASPIRIN EC 325 MG PO TBEC
325.0000 mg | DELAYED_RELEASE_TABLET | Freq: Two times a day (BID) | ORAL | Status: DC
  Administered 2015-09-16 – 2015-09-18 (×5): 325 mg via ORAL
  Filled 2015-09-15 (×5): qty 1

## 2015-09-15 MED ORDER — ENOXAPARIN SODIUM 30 MG/0.3ML ~~LOC~~ SOLN: 30.0000 mg | SUBCUTANEOUS | Status: DC

## 2015-09-15 SURGICAL SUPPLY — 34 items
BAG SPEC THK2 15X12 ZIP CLS (MISCELLANEOUS)
BAG ZIPLOCK 12X15 (MISCELLANEOUS) IMPLANT
BNDG GAUZE ELAST 4 BULKY (GAUZE/BANDAGES/DRESSINGS) ×3 IMPLANT
COVER PERINEAL POST (MISCELLANEOUS) ×3 IMPLANT
DRAPE STERI IOBAN 125X83 (DRAPES) ×3 IMPLANT
DRAPE U-SHAPE 47X51 STRL (DRAPES) ×6 IMPLANT
DRSG AQUACEL AG ADV 3.5X 4 (GAUZE/BANDAGES/DRESSINGS) ×2 IMPLANT
DRSG AQUACEL AG ADV 3.5X 6 (GAUZE/BANDAGES/DRESSINGS) ×3 IMPLANT
DURAPREP 26ML APPLICATOR (WOUND CARE) ×3 IMPLANT
ELECT REM PT RETURN 9FT ADLT (ELECTROSURGICAL) ×3
ELECTRODE REM PT RTRN 9FT ADLT (ELECTROSURGICAL) ×1 IMPLANT
GLOVE BIOGEL M 7.0 STRL (GLOVE) IMPLANT
GLOVE BIOGEL PI IND STRL 7.5 (GLOVE) ×1 IMPLANT
GLOVE BIOGEL PI IND STRL 8.5 (GLOVE) IMPLANT
GLOVE BIOGEL PI INDICATOR 7.5 (GLOVE) ×2
GLOVE BIOGEL PI INDICATOR 8.5 (GLOVE)
GLOVE ECLIPSE 8.0 STRL XLNG CF (GLOVE) IMPLANT
GLOVE ORTHO TXT STRL SZ7.5 (GLOVE) ×3 IMPLANT
GOWN STRL REUS W/TWL LRG LVL3 (GOWN DISPOSABLE) ×3 IMPLANT
GOWN STRL REUS W/TWL XL LVL3 (GOWN DISPOSABLE) IMPLANT
KIT BASIN OR (CUSTOM PROCEDURE TRAY) ×3 IMPLANT
LIQUID BAND (GAUZE/BANDAGES/DRESSINGS) ×3 IMPLANT
MANIFOLD NEPTUNE II (INSTRUMENTS) ×3 IMPLANT
NS IRRIG 1000ML POUR BTL (IV SOLUTION) ×3 IMPLANT
PACK GENERAL/GYN (CUSTOM PROCEDURE TRAY) ×3 IMPLANT
PIN GUIDE DRILL TIP 2.8X300 (DRILL) ×6 IMPLANT
POSITIONER SURGICAL ARM (MISCELLANEOUS) ×3 IMPLANT
SCREW CANN 8.0X85 HIP (Screw) ×4 IMPLANT
SCREW CANN 8.0X95 HIP (Screw) ×2 IMPLANT
SUT MNCRL AB 4-0 PS2 18 (SUTURE) ×1 IMPLANT
SUT VIC AB 1 CT1 36 (SUTURE) ×2 IMPLANT
SUT VIC AB 2-0 CT1 27 (SUTURE) ×3
SUT VIC AB 2-0 CT1 TAPERPNT 27 (SUTURE) ×1 IMPLANT
TOWEL OR 17X26 10 PK STRL BLUE (TOWEL DISPOSABLE) ×3 IMPLANT

## 2015-09-15 NOTE — NC FL2 (Signed)
[place] LEVEL OF CARE SCREENING TOOL     IDENTIFICATION  Patient Name: Austin Gallagher Birthdate: December 21, 1925 Sex: male Admission Date (Current Location): 09/14/2015  [facility] and [place] Number:  Herbalist and Address:         Provider Number: [id number]  Attending Physician Name and Address:  Jonetta Osgood, MD  Relative Name and Phone Number:       Current Level of Care: Hospital Recommended Level of Care: [place] Prior Approval Number:    Date Approved/Denied:   PASRR Number:    Discharge Plan: SNF    Current Diagnoses: Patient Active Problem List   Diagnosis Date Noted  . Closed hip fracture ([place]) 09/14/2015  . Femoral fracture ([place]) 09/14/2015  . Vomiting 03/30/2013  . Gastroenteritis 02/05/2013  . Hyponatremia 02/05/2013  . Nausea vomiting and diarrhea 02/04/2013  . Dizziness 02/04/2013  . AKI (acute kidney injury) ([place]) 02/04/2013  . Moderate dementia with behavioral disturbance 01/29/2013  . LV (left ventricular) mural thrombus ([place]) 06/09/2012  . Seizure ([place]) 06/08/2012  . Elevated troponin 06/08/2012  . Mixed hyperlipidemia 07/21/2008  . Essential hypertension, benign 07/21/2008  . CORONARY ATHEROSCLEROSIS, NATIVE VESSEL 07/21/2008  . TOTAL KNEE REPLACEMENT, HX OF 07/21/2008    Orientation RESPIRATION BLADDER Height & Weight     Self  O2 Continent Weight:   Height:  5\' 6"  (167.6 cm)  BEHAVIORAL SYMPTOMS/MOOD NEUROLOGICAL BOWEL NUTRITION STATUS  Other (Comment) (Spouse reports no behaviors. Staff reports pt can be  uncooperative, at times, with care.)   Continent Diet  AMBULATORY STATUS COMMUNICATION OF NEEDS Skin   Extensive Assist Verbally Surgical wounds                       Personal Care Assistance Level of Assistance  Bathing, Feeding, Dressing Bathing Assistance: Maximum assistance Feeding assistance: Limited assistance Dressing Assistance: Maximum assistance     Functional  Limitations Info  Sight, Hearing, Speech Sight Info: Adequate Hearing Info: Impaired Speech Info: Adequate    SPECIAL CARE FACTORS FREQUENCY  PT (By licensed PT), OT (By licensed OT)     PT Frequency: 5 x wk OT Frequency: 5 x wk            Contractures Contractures Info: Not present    Additional Factors Info  Psychotropic, Allergies, Code Status Code Status Info: Full Code             Current Medications (09/15/2015):  This is the current hospital active medication list Current Facility-Administered Medications  Medication Dose Route Frequency Provider Last Rate Last Dose  . 0.9 %  sodium chloride infusion   Intravenous Continuous Jonetta Osgood, MD 100 mL/hr at 09/15/15 0942    . ceFAZolin (ANCEF) IVPB 2g/100 mL premix  2 g Intravenous On Call to OR Melina Schools, MD      . chlorhexidine (HIBICLENS) 4 % liquid 4 application  60 mL Topical Once Melina Schools, MD   4 application at [id number]  . cholecalciferol (VITAMIN D) tablet 2,000 Units  2,000 Units Oral Daily Gennaro Africa, MD   2,000 Units at 09/15/15 1023  . donepezil (ARICEPT) tablet 10 mg  10 mg Oral Daily Gennaro Africa, MD   10 mg at 09/15/15 1025  . enoxaparin (LOVENOX) injection 30 mg  30 mg Subcutaneous Q24H Minda Ditto, RPH      . metoprolol succinate (TOPROL-XL) 24 hr tablet 12.5 mg  12.5 mg Oral q morning -  10a Gennaro Africa, MD   12.5 mg at 09/15/15 1000  . morphine 2 MG/ML injection 2 mg  2 mg Intravenous Q3H PRN Gennaro Africa, MD   2 mg at 09/15/15 1021  . ondansetron (ZOFRAN) injection 4 mg  4 mg Intravenous Q6H PRN Gennaro Africa, MD   4 mg at 09/15/15 0948  . pantoprazole (PROTONIX) EC tablet 40 mg  40 mg Oral Daily Gennaro Africa, MD   40 mg at 09/15/15 1023  . povidone-iodine 10 % swab 2 application  2 application Topical Once Melina Schools, MD      . sertraline (ZOLOFT) tablet 50 mg  50 mg Oral Daily Gennaro Africa, MD   50 mg at 09/15/15 1023  . traMADol (ULTRAM) tablet 50 mg  50 mg Oral Q3H PRN [facility], PA-C   50 mg at 09/14/15 2001     Discharge Medications: Please see discharge summary for a list of discharge medications.  Relevant Imaging Results:  Relevant Lab Results:   Additional Information SS # [id number]  Charbel Los, Randall An, LCSW

## 2015-09-15 NOTE — Anesthesia Procedure Notes (Signed)
Procedure Name: Intubation Date/Time: 09/15/2015 7:04 PM Performed by: [name] D Pre-anesthesia Checklist: Patient identified, Emergency Drugs available, Suction available and Patient being monitored Patient Re-evaluated:Patient Re-evaluated prior to inductionOxygen Delivery Method: Circle system utilized Preoxygenation: Pre-oxygenation with 100% oxygen Intubation Type: IV induction Ventilation: Mask ventilation without difficulty Laryngoscope Size: Mac and 4 Grade View: Grade II Tube type: Oral Tube size: 7.5 mm Number of attempts: 1 Airway Equipment and Method: Stylet Placement Confirmation: ETT inserted through vocal cords under direct vision,  positive ETCO2 and breath sounds checked- equal and bilateral Secured at: 22 cm Tube secured with: Tape Dental Injury: Teeth and Oropharynx as per pre-operative assessment

## 2015-09-15 NOTE — Progress Notes (Signed)
Patient ID: Austin Gallagher, male   DOB: 11/24/1925, 82 y.o.   MRN: [id number]  Reviewed admission notes and history Spoke to his wife who works as Psychologist, occupational in surgical waiting area  Reviewed plans based on radiographic assessment of a stable right femoral neck fracture NPO Consent ordered Plan for closed reduction percutaneous screw fixation of his right femoral neck fracture Will try to have WBAT based on fixation of screws due to underlying dementia and inability for compliance SNF post op prior to returning to memory care unit

## 2015-09-15 NOTE — Transfer of Care (Signed)
Immediate Anesthesia Transfer of Care Note  Patient: Austin Gallagher  Procedure(s) Performed: Procedure(s): CANNULATED HIP PINNING RIGHT (Right)  Patient Location: PACU  Anesthesia Type:General  Level of Consciousness: awake, alert  and oriented  Airway & Oxygen Therapy: Patient Spontanous Breathing and Patient connected to face mask oxygen  Post-op Assessment: Report given to RN and Post -op Vital signs reviewed and stable  Post vital signs: Reviewed and stable  Last Vitals:  Filed Vitals:   09/15/15 1356 09/15/15 1734  BP: 113/41 129/60  Pulse: 68 58  Temp: 36.7 C 37.1 C  Resp: 18 18    Last Pain:  Filed Vitals:   09/15/15 1829  PainSc: Asleep         Complications: No apparent anesthesia complications

## 2015-09-15 NOTE — Anesthesia Preprocedure Evaluation (Signed)
Anesthesia Evaluation  Patient identified by MRN, date of birth, ID band Patient awake    Reviewed: Allergy & Precautions, H&P , NPO status , Patient's Chart, lab work & pertinent test results, reviewed documented beta blocker date and time   Airway Mallampati: II  TM Distance: >3 FB Neck ROM: full    Dental no notable dental hx. (+) Dental Advisory Given, Teeth Intact   Pulmonary neg pulmonary ROS,    Pulmonary exam normal breath sounds clear to auscultation       Cardiovascular Exercise Tolerance: Poor hypertension, Pt. on home beta blockers + CAD and + Cardiac Stents  Normal cardiovascular exam Rhythm:regular Rate:Normal  LV mural thrombus   Neuro/Psych Seizures -,  dementia negative neurological ROS  negative psych ROS   GI/Hepatic negative GI ROS, Neg liver ROS,   Endo/Other  negative endocrine ROS  Renal/GU Renal InsufficiencyRenal diseaseCRT 2.04  negative genitourinary   Musculoskeletal   Abdominal   Peds  Hematology negative hematology ROS (+)   Anesthesia Other Findings   Reproductive/Obstetrics negative OB ROS                             Anesthesia Physical Anesthesia Plan  ASA: III  Anesthesia Plan: General   Post-op Pain Management:    Induction: Intravenous  Airway Management Planned: Oral ETT  Additional Equipment:   Intra-op Plan:   Post-operative Plan: Extubation in OR  Informed Consent: I have reviewed the patients History and Physical, chart, labs and discussed the procedure including the risks, benefits and alternatives for the proposed anesthesia with the patient or authorized representative who has indicated his/her understanding and acceptance.   Dental Advisory Given  Plan Discussed with: CRNA  Anesthesia Plan Comments:         Anesthesia Quick Evaluation

## 2015-09-15 NOTE — Progress Notes (Signed)
Initial Nutrition Assessment  DOCUMENTATION CODES:   Not applicable  INTERVENTION:  -Ensure Enlive po BID, each supplement provides 350 kcal and 20 grams of protein with diet advancement -RD to continue to monitor  NUTRITION DIAGNOSIS:   Inadequate oral intake related to poor appetite as evidenced by per patient/family report.  GOAL:   Patient will meet greater than or equal to 90% of their needs  MONITOR:   PO intake, I & O's, Skin, Labs, Weight trends  REASON FOR ASSESSMENT:   Low Braden    ASSESSMENT:   Patient is a 82 yo male with history of CAD s/p stent in 2005, Dementia, HTN, HLD who was brought family as he tripped today while trying to stand up. He fell backward landing on his bottom. He did not have any complaints prior to that and did loose consciousness. He has history of falls for the past few years. No dizziness, palpitations, chest pain, dyspnea or vertigo. Currently he complains of hip pain  Spoke with Mr. Rodgers at bedside, he was pleasantly confused. No family available. Per RD exam, pt exhibits mild muscle wasting at temples and mild fat depletion at orbitals, otherwise WDL. Unable to retrieve diet Hx. No complaints of appetite in chart outside of a score of "1" for decreased appetite via Malnutrition Screening Tool. Likely pt's appetite is decreased related to age, dementia. Weight has fluctuated over the past 3 years. No new weight for this admission. Will provide ensure to help with wound healing after surgery.  Labs and Medications reviewed: Na 129; Cr 2.04, BUN 34, EGFR 27; Vit D 2000 IU Daily; NS 164m/hr  Diet Order:  Diet NPO time specified Except for: Sips with Meds  Skin:  Wound (see comment) (Bruise to Right Lower Back)  Last BM:  6/13  Height:   Ht Readings from Last 1 Encounters:  09/14/15 '5\' 6"'$  (1.676 m)    Weight:   Wt Readings from Last 1 Encounters:  07/29/14 134 lb 6.4 oz (60.963 kg)    Ideal Body Weight:  64.54  kg  BMI:  There is no weight on file to calculate BMI.  Estimated Nutritional Needs:   Kcal:  1500-1800 calories  Protein:  60-75 grams  Fluid:  >/= 1.5 L  EDUCATION NEEDS:   No education needs identified at this time  WSatira Anis Fenix Rorke, MS, RD LDN Inpatient Clinical Dietitian Pager [phone]

## 2015-09-15 NOTE — Brief Op Note (Signed)
09/14/2015 - 09/15/2015  8:04 PM  PATIENT:  Adan Sis  82 y.o. male  PRE-OPERATIVE DIAGNOSIS:  Non-displaced right femoral neck fracture  POST-OPERATIVE DIAGNOSIS:  Non-displaced right femoral neck fracture  PROCEDURE:  Procedure(s): CANNULATED HIP PINNING RIGHT (Right)  SURGEON:  Surgeon(s) and Role:    * Paralee Cancel, MD - Primary  PHYSICIAN ASSISTANT: None   ANESTHESIA:   general  EBL:  Total I/O In: -  Out: 150 [Urine:100; Blood:50]  BLOOD ADMINISTERED:none  DRAINS: none   LOCAL MEDICATIONS USED:  NONE  SPECIMEN:  No Specimen  DISPOSITION OF SPECIMEN:  N/A  COUNTS:  YES  TOURNIQUET:  * No tourniquets in log *  DICTATION: .Other Dictation: Dictation Number [id number]  PLAN OF CARE: Admit to inpatient   PATIENT DISPOSITION:  PACU - hemodynamically stable.   Delay start of Pharmacological VTE agent (>24hrs) due to surgical blood loss or risk of bleeding: no

## 2015-09-15 NOTE — Progress Notes (Signed)
PROGRESS NOTE        PATIENT DETAILS Name: Austin Gallagher Age: 82 y.o. Sex: male Date of Birth: April 04, 1926 Admit Date: 09/14/2015 Admitting Physician Gennaro Africa, MD [id number], MD  Brief Narrative: Patient is a 82 y.o. male with remote history of CAD requiring PCI, dementia residing in a memory care unit presented with a mechanical fall with resultant right hip fracture. Scheduled for operative repair on 6/13  Subjective: Pleasantly confused-but able to answer some questions. Spouse at bedside. Tender in the suprapubic area, bladder is palpable  Assessment/Plan: Active Problems: Closed right hip fracture: Secondary to a mechanical fall, orthopedics consulted for operative repair today. This M.D. spoke with patient's spouse, she is aware that patient is at moderate risk for the proposed surgical procedure, he is as optimized as he could be for his age and multiple medical comorbidities. She is accepting risks and is willing to proceed with surgery.  Acute renal failure: Suspect that this is mostly related to urinary retention-seen on exam this morning, with HCTZ/valsartan contributing as well. His Foley catheter, hydrated, avoid nephrotoxic agents and follow. Start low-dose Flomax.  Acute urinary retention: See above. Will need a voiding trial secondary to above.  History of CAD: Has a history of remote PCI, but no recent chest pain or shortness of breath. He is able to ambulate in his memory care unit with the help of a walker. Resume aspirin once operative repair is completed, continue metoprolol. Follow. Continue aspirin, metoprolol.  Dementia: Pleasantly confused-current mental status close to her usual baseline. Continue Aricept and Zoloft.  DVT Prophylaxis: Prophylactic Lovenox   Code Status: Full code   Family Communication: Spouse at bedside  Disposition Plan: Remain inpatient-but will plan on SNF on discharge  Antimicrobial  agents: None  Procedures: None  CONSULTS:  orthopedic surgery  Time spent: 25 minutes-Greater than 50% of this time was spent in counseling, explanation of diagnosis, planning of further management, and coordination of care.  MEDICATIONS: Anti-infectives    Start     Dose/Rate Route Frequency Ordered Stop   09/15/15 0600  ceFAZolin (ANCEF) IVPB 2g/100 mL premix     2 g 200 mL/hr over 30 Minutes Intravenous On call to O.R. 09/14/15 2102 09/16/15 0559      Scheduled Meds: .  ceFAZolin (ANCEF) IV  2 g Intravenous On Call to OR  . chlorhexidine  60 mL Topical Once  . cholecalciferol  2,000 Units Oral Daily  . donepezil  10 mg Oral Daily  . enoxaparin (LOVENOX) injection  30 mg Subcutaneous Q24H  . metoprolol succinate  12.5 mg Oral q morning - 10a  . pantoprazole  40 mg Oral Daily  . povidone-iodine  2 application Topical Once  . sertraline  50 mg Oral Daily   Continuous Infusions: . sodium chloride 100 mL/hr at 09/15/15 0942   PRN Meds:.morphine injection, ondansetron (ZOFRAN) IV, traMADol   PHYSICAL EXAM: Vital signs: Filed Vitals:   09/14/15 1414 09/14/15 1735 09/14/15 2149 09/15/15 0630  BP: 141/73 146/78 112/49 101/46  Pulse: 70 80 55 65  Temp:   97.9 F (36.6 C) 98.7 F (37.1 C)  TempSrc:   Oral Oral  Resp: 18 20 17 19   Height:   5\' 6"  (1.676 m)   SpO2: 91% 90% 95% 93%   There were no vitals filed for this visit. There is no weight on  file to calculate BMI.   Gen Exam: Awake, Pleasantly confused. Not in any distress  Neck: Supple, No JVD.   Chest: B/L Clear.   CVS: S1 S2 Regular, no murmurs.  Abdomen: soft, BS +, non tender, non distended. Tender suprapubic area with a slightly distended bladder.  Extremities: no edema, lower extremities warm to touch. Neurologic: Non Focal.   Skin: No Rash or lesions   Wounds: N/A.   LABORATORY DATA: CBC:  Recent Labs Lab 09/14/15 1421 09/14/15 2252 09/15/15 0643  WBC 14.9* 14.5* 11.8*  NEUTROABS 12.7*  --   10.3*  HGB 12.8* 12.5* 11.4*  HCT 36.7* 35.3* 32.3*  MCV 92.7 91.7 94.2  PLT 232 232 [id number]    Basic Metabolic Panel:  Recent Labs Lab 09/14/15 1421 09/14/15 2252 09/15/15 0643  NA 131* 129* 129*  K 4.9 3.9 4.1  CL 97* 96* 97*  CO2 26 24 24   GLUCOSE 115* 158* 141*  BUN 27* 26* 34*  CREATININE 1.29* 1.44* 2.04*  CALCIUM 9.3 8.9 8.6*    GFR: CrCl cannot be calculated (Unknown ideal weight.).  Liver Function Tests: No results for input(s): AST, ALT, ALKPHOS, BILITOT, PROT, ALBUMIN in the last 168 hours. No results for input(s): LIPASE, AMYLASE in the last 168 hours. No results for input(s): AMMONIA in the last 168 hours.  Coagulation Profile:  Recent Labs Lab 09/14/15 2252  INR 1.14    Cardiac Enzymes: No results for input(s): CKTOTAL, CKMB, CKMBINDEX, TROPONINI in the last 168 hours.  BNP (last 3 results) No results for input(s): PROBNP in the last 8760 hours.  HbA1C: No results for input(s): HGBA1C in the last 72 hours.  CBG: No results for input(s): GLUCAP in the last 168 hours.  Lipid Profile: No results for input(s): CHOL, HDL, LDLCALC, TRIG, CHOLHDL, LDLDIRECT in the last 72 hours.  Thyroid Function Tests: No results for input(s): TSH, T4TOTAL, FREET4, T3FREE, THYROIDAB in the last 72 hours.  Anemia Panel: No results for input(s): VITAMINB12, FOLATE, FERRITIN, TIBC, IRON, RETICCTPCT in the last 72 hours.  Urine analysis:    Component Value Date/Time   COLORURINE YELLOW 10/09/2014 1319   APPEARANCEUR CLEAR 10/09/2014 1319   LABSPEC 1.022 10/09/2014 1319   PHURINE 5.5 10/09/2014 1319   GLUCOSEU NEGATIVE 10/09/2014 1319   HGBUR NEGATIVE 10/09/2014 1319   BILIRUBINUR NEGATIVE 10/09/2014 1319   KETONESUR NEGATIVE 10/09/2014 1319   PROTEINUR NEGATIVE 10/09/2014 1319   UROBILINOGEN 1.0 10/09/2014 1319   NITRITE NEGATIVE 10/09/2014 1319   LEUKOCYTESUR NEGATIVE 10/09/2014 1319    Sepsis Labs: Lactic Acid, Venous No results found for:  LATICACIDVEN  MICROBIOLOGY: Recent Results (from the past 240 hour(s))  Surgical pcr screen     Status: None   Collection Time: 09/14/15 11:52 PM  Result Value Ref Range Status   MRSA, PCR NEGATIVE NEGATIVE Final   Staphylococcus aureus NEGATIVE NEGATIVE Final    Comment:        The Xpert SA Assay (FDA approved for NASAL specimens in patients over 33 years of age), is one component of a comprehensive surveillance program.  Test performance has been validated by [facility] for patients greater than or equal to 80 year old. It is not intended to diagnose infection nor to guide or monitor treatment.     RADIOLOGY STUDIES/RESULTS: Ct Hip Right Wo Contrast  09/14/2015  CLINICAL DATA:  Status post fall from a standing position this morning. Right hip pain and difficulty walking. Initial encounter. EXAM: CT OF THE RIGHT HIP WITHOUT  CONTRAST TECHNIQUE: Multidetector CT imaging of the right hip was performed according to the standard protocol. Multiplanar CT image reconstructions were also generated. COMPARISON:  Plain films the right hip earlier today. FINDINGS: The patient has a mildly impacted subcapital fracture of the right femoral neck. The fracture shows minimal anterior displacement. No other acute bony or joint abnormality is identified. The right femoral head is located. No lytic or sclerotic bony lesion is seen. There is mild degenerative disease about the right hip. Imaged intrapelvic contents demonstrate no acute abnormality. Atherosclerosis is noted. The patient is status post hernia repair. IMPRESSION: Acute, mildly impacted subcapital fracture right hip with minimal anterior displacement. Electronically Signed   By: Inge Rise M.D.   On: 09/14/2015 16:37   Dg Hip Unilat  With Pelvis 2-3 Views Right  09/14/2015  CLINICAL DATA:  [address] at nursing home today and landed on right side. Right hip pain after fall. EXAM: DG HIP (WITH OR WITHOUT PELVIS) 2-3V RIGHT COMPARISON:  No  comparison studies available. FINDINGS: Frontal pelvis AP and frog-leg lateral views of the right hip show no gross fracture or dislocation. Apparent disruption of the compressive trabecula in the femoral neck raises the question of a nondisplaced fracture although this is not a definite finding and may represent superimposition of osteophytes SI joints and symphysis pubis are unremarkable. Patient has had prior right lower quadrant ventral mesh placement. IMPRESSION: Possible, but not definite nondisplaced femoral neck fracture. MRI recommended to further evaluate. Electronically Signed   By: Misty Stanley M.D.   On: 09/14/2015 13:04     LOS: 1 day   Oren Binet, MD  [facility] Pager:[phone]  If 7PM-7AM, please contact night-coverage [email or web address] Password [email or web address] 09/15/2015, 1:53 PM

## 2015-09-15 NOTE — Anesthesia Postprocedure Evaluation (Signed)
Anesthesia Post Note  Patient: Austin Gallagher  Procedure(s) Performed: Procedure(s) (LRB): CANNULATED HIP PINNING RIGHT (Right)  Patient location during evaluation: PACU Anesthesia Type: General Level of consciousness: awake and alert Pain management: pain level controlled Vital Signs Assessment: post-procedure vital signs reviewed and stable Respiratory status: spontaneous breathing, nonlabored ventilation, respiratory function stable and patient connected to nasal cannula oxygen Cardiovascular status: blood pressure returned to baseline and stable Postop Assessment: no signs of nausea or vomiting Anesthetic complications: no    Last Vitals:  Filed Vitals:   09/15/15 1734 09/15/15 2009  BP: 129/60 149/60  Pulse: 58 81  Temp: 37.1 C 37.1 C  Resp: 18 18    Last Pain:  Filed Vitals:   09/15/15 2011  PainSc: Asleep                 Jhett Fretwell,Kavi L

## 2015-09-15 NOTE — Progress Notes (Signed)
IV fluid increased for patient this am.  Nurse to conti to monitor urine out / incontinents this morning.

## 2015-09-16 ENCOUNTER — Encounter (HOSPITAL_COMMUNITY): Payer: Self-pay | Admitting: Orthopedic Surgery

## 2015-09-16 DIAGNOSIS — S72001A Fracture of unspecified part of neck of right femur, initial encounter for closed fracture: Secondary | ICD-10-CM

## 2015-09-16 DIAGNOSIS — R339 Retention of urine, unspecified: Secondary | ICD-10-CM

## 2015-09-16 LAB — CBC
HEMATOCRIT: 27.7 % — AB (ref 39.0–52.0)
HEMOGLOBIN: 9.5 g/dL — AB (ref 13.0–17.0)
MCH: 32 pg (ref 26.0–34.0)
MCHC: 34.3 g/dL (ref 30.0–36.0)
MCV: 93.3 fL (ref 78.0–100.0)
Platelets: 177 10*3/uL (ref 150–400)
RBC: 2.97 MIL/uL — ABNORMAL LOW (ref 4.22–5.81)
RDW: 12.8 % (ref 11.5–15.5)
WBC: 12 10*3/uL — ABNORMAL HIGH (ref 4.0–10.5)

## 2015-09-16 LAB — BASIC METABOLIC PANEL
ANION GAP: 7 (ref 5–15)
BUN: 28 mg/dL — AB (ref 6–20)
CO2: 22 mmol/L (ref 22–32)
Calcium: 7.9 mg/dL — ABNORMAL LOW (ref 8.9–10.3)
Chloride: 104 mmol/L (ref 101–111)
Creatinine, Ser: 1.58 mg/dL — ABNORMAL HIGH (ref 0.61–1.24)
GFR calc Af Amer: 43 mL/min — ABNORMAL LOW (ref >60)
GFR calc non Af Amer: 37 mL/min — ABNORMAL LOW (ref >60)
GLUCOSE: 117 mg/dL — AB (ref 65–99)
POTASSIUM: 3.5 mmol/L (ref 3.5–5.1)
Sodium: 133 mmol/L — ABNORMAL LOW (ref 135–145)

## 2015-09-16 MED ORDER — TRAMADOL HCL 50 MG PO TABS: 50.0000 mg | ORAL_TABLET | ORAL | Status: DC | PRN

## 2015-09-16 MED ORDER — DOCUSATE SODIUM 100 MG PO CAPS: 100.0000 mg | ORAL_CAPSULE | Freq: Two times a day (BID) | ORAL | Status: DC

## 2015-09-16 MED ORDER — POLYETHYLENE GLYCOL 3350 17 G PO PACK: 17.0000 g | PACK | Freq: Every day | ORAL | Status: DC | PRN

## 2015-09-16 MED ORDER — FERROUS SULFATE 325 (65 FE) MG PO TABS: 325.0000 mg | ORAL_TABLET | Freq: Three times a day (TID) | ORAL | Status: AC

## 2015-09-16 MED ORDER — ASPIRIN 325 MG PO TBEC: 325.0000 mg | DELAYED_RELEASE_TABLET | Freq: Two times a day (BID) | ORAL | Status: AC

## 2015-09-16 MED ORDER — LIP MEDEX EX OINT
TOPICAL_OINTMENT | CUTANEOUS | Status: AC
  Administered 2015-09-16: 10:00:00
  Filled 2015-09-16: qty 7

## 2015-09-16 NOTE — Op Note (Signed)
NAMEMarland Kitchen  YADRIEL, GREGORI NO.:  [id number]  MEDICAL RECORD NO.:  [id number]  LOCATION:  [id number]                         FACILITY:  [facility]  PHYSICIAN:  Pietro Cassis. Alvan Dame, M.D.  DATE OF BIRTH:  11/15/1925  DATE OF PROCEDURE:  09/15/2015 DATE OF DISCHARGE:                              OPERATIVE REPORT   PREOPERATIVE DIAGNOSIS:  Nondisplaced, stable right femoral neck fracture.  POSTOPERATIVE DIAGNOSIS:  Nondisplaced, stable right femoral neck fracture.  PROCEDURE:  Closed reduction and percutaneous cannulated screw fixation of right femoral neck fracture utilizing the Biomet Titanium screws, 8.0 mm.  SURGEON:  Pietro Cassis. Alvan Dame, M.D.  ASSISTANT:  Surgical team.  ANESTHESIA:  General.  SPECIMENS:  None.  COMPLICATION:  None.  BLOOD LOSS:  Minimal.  INDICATIONS FOR PROCEDURE:  Mr. Thorstenson is an 82 year old male with comorbidities include dementia.  He unfortunately had a fall and had hip pain.  He had a difficult time bearing weight and was brought to the emergency room.  Evaluation by CT scan was inconclusive and followup MRI indicated fracture of femoral neck, later confirmed by plain films and CT scan review.  I reviewed with his wife who is a Psychologist, occupational at [facility], the indications for surgery, I felt that for his age, comorbidities and the fracture pattern, he is a candidate to have this fixed with percutaneous screws versus a hemiarthroplasty.  Reviewed the risks of nonunion, malunion, need for future surgery, discussed the minimal risks of infection, DVT, but felt this would be in good operation for him at this point.  Consent was obtained for benefit of fracture management.  PROCEDURE IN DETAIL:  The patient was brought to the operative theater. Once adequate anesthesia, preoperative antibiotics, Ancef administered, he was positioned supine on the fracture table.  His left unaffected extremity was flexed and abducted out of the way with bony  prominences padded particularly over the peroneal nerve.  The right leg was placed into the traction boot.  Perineal post was placed.  Only gentle traction was applied to stabilize the extremity.  Once positioned appropriately, the fluoroscopy was brought into the field in order to identify and confirm the fracture and maintain a stable reduction.  Once this was confirmed on AP and lateral planes, we then prepped and draped the right hip with a shower curtain technique sterilely.  A time-out was performed identifying the patient, planned procedure and extremity.  Fluoroscopy was brought back into the field, landmarks were identified.  An incision was made laterally at the level of the lesser trochanter.  Soft tissue dissection was carried through the iliotibial band.  I then under fluoroscopic imaging, passed three guidewires; one inferior, one posterior proximal and one anterior proximal in a triangle fashion on the AP view, but parallel on the lateral view.  Once these were confirmed, I measured the depth and selected appropriate screws.  I used 8.0-mm cannulated screws, these were cancellous screws with 40-mm thread using the inferior and 16-mm on the proximal too.  These were tightened down with excellent bony purchase against his lateral cortex.  Final radiographs were obtained in AP and lateral planes.  I then irrigated the wound with normal saline  solution.  I then reapproximated the iliotibial band using #1 Vicryl.  The remainder of the wound was closed with 2-0 Vicryl, then cleaned, dried and dressed sterilely using surgical glue and an Aquacel dressing.  He was then extubated and brought to the recovery room in stable condition tolerating the procedure well.     Pietro Cassis Alvan Dame, M.D.     MDO/MEDQ  D:  09/15/2015  T:  09/16/2015  Job:  [id number]

## 2015-09-16 NOTE — Progress Notes (Signed)
OT Cancellation Note  Patient Details Name: Bavly Wienckowski MRN: [id number] DOB: Jan 02, 1926   Cancelled Treatment:    Reason Eval/Treat Not Completed: OT screened, no acute needs identified, will sign off - Pt is for SNF level rehab at discharge; he required 3+ assist for bed mobility, and his insurance does not require preauthorization.  All OT needs can be addressed at SNF.   Darlina Rumpf [place], OTR/L [id number]  09/16/2015, 2:18 PM

## 2015-09-16 NOTE — Clinical Social Work Placement (Signed)
   CLINICAL SOCIAL WORK PLACEMENT  NOTE  Date:  09/16/2015  Patient Details  Name: Austin Gallagher MRN: [id number] Date of Birth: [birth year]  Clinical Social Work is seeking post-discharge placement for this patient at the [place] level of care (*CSW will initial, date and re-position this form in  chart as items are completed):  Yes   Patient/family provided with [place] Work Department's list of facilities offering this level of care within the geographic area requested by the patient (or if unable, by the patient's family).  Yes   Patient/family informed of their freedom to choose among providers that offer the needed level of care, that participate in Medicare, Medicaid or managed care program needed by the patient, have an available bed and are willing to accept the patient.  Yes   Patient/family informed of [facility]'s ownership interest in [facility] and [facility], as well as of the fact that they are under no obligation to receive care at these facilities.  PASRR submitted to EDS on 09/15/15     PASRR number received on 09/15/15     Existing PASRR number confirmed on       FL2 transmitted to all facilities in geographic area requested by pt/family on 09/15/15     FL2 transmitted to all facilities within larger geographic area on       Patient informed that his/her managed care company has contracts with or will negotiate with certain facilities, including the following:        Yes   Patient/family informed of bed offers received.  Patient chooses bed at [facility]     Physician recommends and patient chooses bed at      Patient to be transferred to [facility] on  .  Patient to be transferred to facility by       Patient family notified on   of transfer.  Name of family member notified:        PHYSICIAN       Additional Comment:    _______________________________________________ Luretha Rued, [place] 09/16/2015, 2:04 PM

## 2015-09-16 NOTE — Progress Notes (Addendum)
     Subjective: 1 Day Post-Op Procedure(s) (LRB): CANNULATED HIP PINNING RIGHT (Right)   Patient resting comfortably in bed. Patient pleasantly confused and not even aware where he is.  Family feels that he is doing well and talking away.  They feel that his mind really wasn't effected by the anesthesia.  I discussed the case with the wife, she understood and was thankful for the explanation.    Objective:   VITALS:   Filed Vitals:   09/16/15 0500 09/16/15 0927  BP: 121/51 135/62  Pulse: 67 91  Temp: 97.3 F (36.3 C) 99.2 F (37.3 C)  Resp: 14 16    Incision: scant drainage No cellulitis present Compartment soft  LABS  Recent Labs  09/14/15 2252 09/15/15 0643 09/16/15 0357  HGB 12.5* 11.4* 9.5*  HCT 35.3* 32.3* 27.7*  WBC 14.5* 11.8* 12.0*  PLT 232 205 177     Recent Labs  09/14/15 2252 09/15/15 0643 09/16/15 0357  NA 129* 129* 133*  K 3.9 4.1 3.5  BUN 26* 34* 28*  CREATININE 1.44* 2.04* 1.58*  GLUCOSE 158* 141* 117*     Assessment/Plan: 1 Day Post-Op Procedure(s) (LRB): CANNULATED HIP PINNING RIGHT (Right)  Up with therapy Discharge to SNF eventually, when ready  Ortho recommendations:  ASA 325 mg bid for 4 weeks for anticoagulation, unless other medically indicated.  Tramadol and APAP for pain management (Rx written).  MiraLax and Colace for constipation  Iron 325 mg tid for 2-3 weeks   WBATon the right leg.  Aquacel dressing to remain in place until follow in clinic in 2 weeks.  Dressing is waterproof and may shower with it in place.  Follow up in 2 weeks at [facility]. Follow up with OLIN,Abelardo Seidner D in 2 weeks.  Contact information:  [facility] [address], [address] [place]   PAC  09/16/2015, 10:16 AM
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
## 2015-09-16 NOTE — Progress Notes (Signed)
PROGRESS NOTE        PATIENT DETAILS Name: Austin Gallagher Age: 82 y.o. Sex: male Date of Birth: 07/12/1925 Admit Date: 09/14/2015 Admitting Physician Gennaro Africa, MD [id number], MD  Brief Narrative: Patient is a 82 y.o. male with remote history of CAD requiring PCI, dementia residing in a memory care unit presented with a mechanical fall with resultant right hip fracture. Scheduled for operative repair on 6/13  Subjective:  no complaints- being fed by his wife  Assessment/Plan: Active Problems: Closed right hip fracture:  Underwent closed reduction and screw fixation today- management per ortho  Acute renal failure:  -  urinary retention s/p foley- improving- cont Flomax - hold HCTZ/valsartan   Acute urinary retention:  -See above. Will need a voiding trial secondary to above.  History of CAD: - Has a history of remote PCI, but no recent chest pain or shortness of breath. He is able to ambulate in his memory care unit with the help of a walker. Resume aspirin once operative repair is completed, continue metoprolol. Follow. Continue aspirin, metoprolol.  Dementia:  - Pleasantly confused-current mental status close to usual baseline. Continue Aricept and Zoloft.  DVT Prophylaxis: Prophylactic Lovenox   Code Status: Full code   Family Communication: Spouse at bedside  Disposition Plan:   SNF on discharge  Antimicrobial agents: None  Procedures: None  CONSULTS:  orthopedic surgery  Time spent: 35  MEDICATIONS: Anti-infectives    Start     Dose/Rate Route Frequency Ordered Stop   09/16/15 0200  ceFAZolin (ANCEF) IVPB 2g/100 mL premix     2 g 200 mL/hr over 30 Minutes Intravenous Every 6 hours 09/15/15 2132 09/16/15 0808   09/15/15 0600  ceFAZolin (ANCEF) IVPB 2g/100 mL premix     2 g 200 mL/hr over 30 Minutes Intravenous On call to O.R. 09/14/15 2102 09/15/15 1906      Scheduled Meds: . aspirin EC  325 mg Oral BID  .  cholecalciferol  2,000 Units Oral Daily  . docusate sodium  100 mg Oral BID  . donepezil  10 mg Oral Daily  . ferrous sulfate  325 mg Oral TID PC  . lip balm      . metoprolol succinate  12.5 mg Oral q morning - 10a  . pantoprazole  40 mg Oral Daily  . sertraline  50 mg Oral Daily  . tamsulosin  0.4 mg Oral Daily   Continuous Infusions: . sodium chloride 100 mL/hr at 09/16/15 0714   PRN Meds:.alum & mag hydroxide-simeth, bisacodyl, magnesium citrate, menthol-cetylpyridinium **OR** phenol, metoCLOPramide **OR** metoCLOPramide (REGLAN) injection, morphine injection, ondansetron (ZOFRAN) IV, polyethylene glycol, traMADol   PHYSICAL EXAM: Vital signs: Filed Vitals:   09/14/15 2149 09/16/15 0024 09/16/15 0500 09/16/15 0927  BP:  121/49 121/51 135/62  Pulse:  66 67 91  Temp:  97.9 F (36.6 C) 97.3 F (36.3 C) 99.2 F (37.3 C)  TempSrc:  Oral Oral Axillary  Resp:  16 14 16   Height: 5\' 6"  (1.676 m)     SpO2:  94% 98% 95%   There were no vitals filed for this visit. There is no weight on file to calculate BMI.   Gen Exam: Awake, Pleasantly confused. Not in any distress  Neck: Supple, No JVD.   Chest: B/L Clear.   CVS: S1 S2 Regular, no murmurs.  Abdomen: soft, BS +, non  tender, non distended. Tender suprapubic area with a slightly distended bladder.  Extremities: no edema, lower extremities warm to touch. Neurologic: Non Focal.   Skin: No Rash or lesions   Wounds: N/A.   LABORATORY DATA: CBC:  Recent Labs Lab 09/14/15 1421 09/14/15 2252 09/15/15 0643 09/16/15 0357  WBC 14.9* 14.5* 11.8* 12.0*  NEUTROABS 12.7*  --  10.3*  --   HGB 12.8* 12.5* 11.4* 9.5*  HCT 36.7* 35.3* 32.3* 27.7*  MCV 92.7 91.7 94.2 93.3  PLT 232 232 205 [id number]    Basic Metabolic Panel:  Recent Labs Lab 09/14/15 1421 09/14/15 2252 09/15/15 0643 09/16/15 0357  NA 131* 129* 129* 133*  K 4.9 3.9 4.1 3.5  CL 97* 96* 97* 104  CO2 26 24 24 22   GLUCOSE 115* 158* 141* 117*  BUN 27* 26* 34* 28*    CREATININE 1.29* 1.44* 2.04* 1.58*  CALCIUM 9.3 8.9 8.6* 7.9*    GFR: CrCl cannot be calculated (Unknown ideal weight.).  Liver Function Tests: No results for input(s): AST, ALT, ALKPHOS, BILITOT, PROT, ALBUMIN in the last 168 hours. No results for input(s): LIPASE, AMYLASE in the last 168 hours. No results for input(s): AMMONIA in the last 168 hours.  Coagulation Profile:  Recent Labs Lab 09/14/15 2252  INR 1.14    Cardiac Enzymes: No results for input(s): CKTOTAL, CKMB, CKMBINDEX, TROPONINI in the last 168 hours.  BNP (last 3 results) No results for input(s): PROBNP in the last 8760 hours.  HbA1C: No results for input(s): HGBA1C in the last 72 hours.  CBG: No results for input(s): GLUCAP in the last 168 hours.  Lipid Profile: No results for input(s): CHOL, HDL, LDLCALC, TRIG, CHOLHDL, LDLDIRECT in the last 72 hours.  Thyroid Function Tests: No results for input(s): TSH, T4TOTAL, FREET4, T3FREE, THYROIDAB in the last 72 hours.  Anemia Panel: No results for input(s): VITAMINB12, FOLATE, FERRITIN, TIBC, IRON, RETICCTPCT in the last 72 hours.  Urine analysis:    Component Value Date/Time   COLORURINE YELLOW 10/09/2014 1319   APPEARANCEUR CLEAR 10/09/2014 1319   LABSPEC 1.022 10/09/2014 1319   PHURINE 5.5 10/09/2014 1319   GLUCOSEU NEGATIVE 10/09/2014 1319   HGBUR NEGATIVE 10/09/2014 1319   BILIRUBINUR NEGATIVE 10/09/2014 1319   KETONESUR NEGATIVE 10/09/2014 1319   PROTEINUR NEGATIVE 10/09/2014 1319   UROBILINOGEN 1.0 10/09/2014 1319   NITRITE NEGATIVE 10/09/2014 1319   LEUKOCYTESUR NEGATIVE 10/09/2014 1319    Sepsis Labs: Lactic Acid, Venous No results found for: LATICACIDVEN  MICROBIOLOGY: Recent Results (from the past 240 hour(s))  Surgical pcr screen     Status: None   Collection Time: 09/14/15 11:52 PM  Result Value Ref Range Status   MRSA, PCR NEGATIVE NEGATIVE Final   Staphylococcus aureus NEGATIVE NEGATIVE Final    Comment:        The Xpert  SA Assay (FDA approved for NASAL specimens in patients over 60 years of age), is one component of a comprehensive surveillance program.  Test performance has been validated by [facility] for patients greater than or equal to 5 year old. It is not intended to diagnose infection nor to guide or monitor treatment.     RADIOLOGY STUDIES/RESULTS: Dg Pelvis Portable  09/15/2015  CLINICAL DATA:  Preop study for closed right hip fracture. Initial encounter. EXAM: PORTABLE PELVIS 1-2 VIEWS COMPARISON:  Radiography and CT from yesterday FINDINGS: Known right femoral neck fracture without signs of interval displacement. Right femoral neck is foreshortened from leg positioning. No evidence of pelvic  ring fracture or diastasis. Osteopenia. Lower abdominal hernia repair with mesh. IMPRESSION: 1. Known right femoral neck fracture without evidence of interval displacement. 2. Intact pelvic ring. Electronically Signed   By: Monte Fantasia M.D.   On: 09/15/2015 13:44   Ct Hip Right Wo Contrast  09/14/2015  CLINICAL DATA:  Status post fall from a standing position this morning. Right hip pain and difficulty walking. Initial encounter. EXAM: CT OF THE RIGHT HIP WITHOUT CONTRAST TECHNIQUE: Multidetector CT imaging of the right hip was performed according to the standard protocol. Multiplanar CT image reconstructions were also generated. COMPARISON:  Plain films the right hip earlier today. FINDINGS: The patient has a mildly impacted subcapital fracture of the right femoral neck. The fracture shows minimal anterior displacement. No other acute bony or joint abnormality is identified. The right femoral head is located. No lytic or sclerotic bony lesion is seen. There is mild degenerative disease about the right hip. Imaged intrapelvic contents demonstrate no acute abnormality. Atherosclerosis is noted. The patient is status post hernia repair. IMPRESSION: Acute, mildly impacted subcapital fracture right hip with  minimal anterior displacement. Electronically Signed   By: Inge Rise M.D.   On: 09/14/2015 16:37   Dg C-arm 61-120 Min-no Report  09/15/2015  CLINICAL DATA: surgery C-ARM 61-120 MINUTES Fluoroscopy was utilized by the requesting physician.  No radiographic interpretation.   Dg Hip Operative Unilat With Pelvis Right  09/15/2015  CLINICAL DATA:  Intraoperative imaging for fixation of a subcapital right hip fracture. EXAM: OPERATIVE RIGHT HIP (WITH PELVIS IF PERFORMED) 4 VIEWS TECHNIQUE: Fluoroscopic spot image(s) were submitted for interpretation post-operatively. COMPARISON:  CT right hip 09/14/2015. FINDINGS: Intraoperative imaging demonstrates placement of 3 screws for fixation of a subcapital fracture. No acute abnormalities identified. IMPRESSION: Intraoperative imaging for fixation of a right hip fracture. Electronically Signed   By: Inge Rise M.D.   On: 09/15/2015 20:10   Dg Hip Unilat  With Pelvis 2-3 Views Right  09/14/2015  CLINICAL DATA:  [place] [address] at nursing home today and landed on right side. Right hip pain after fall. EXAM: DG HIP (WITH OR WITHOUT PELVIS) 2-3V RIGHT COMPARISON:  No comparison studies available. FINDINGS: Frontal pelvis AP and frog-leg lateral views of the right hip show no gross fracture or dislocation. Apparent disruption of the compressive trabecula in the femoral neck raises the question of a nondisplaced fracture although this is not a definite finding and may represent superimposition of osteophytes SI joints and symphysis pubis are unremarkable. Patient has had prior right lower quadrant ventral mesh placement. IMPRESSION: Possible, but not definite nondisplaced femoral neck fracture. MRI recommended to further evaluate. Electronically Signed   By: Misty Stanley M.D.   On: 09/14/2015 13:04     LOS: 2 days   Debbe Odea, MD  [facility]    If 7PM-7AM, please contact night-coverage [email or web address] Password [email or web address] 09/16/2015, 12:47 PM

## 2015-09-16 NOTE — Clinical Social Work Note (Signed)
Clinical Social Work Assessment  Patient Details  Name: Austin Gallagher MRN: [id number] Date of Birth: Jun 18, 1925  Date of referral:  09/16/15               Reason for consult:  Facility Placement, Discharge Planning                Permission sought to share information with:  Facility Art therapist granted to share information::  Yes, Verbal Permission Granted  Name::        Agency::     Relationship::     Contact Information:     Housing/Transportation Living arrangements for the past 2 months:   (memory care unit) Source of Information:  Spouse Patient Interpreter Needed:  None Criminal Activity/Legal Involvement Pertinent to Current Situation/Hospitalization:  No - Comment as needed Significant Relationships:  Spouse Lives with:  Facility Resident Do you feel safe going back to the place where you live?  No (SNF placement needed.) Need for family participation in patient care:  Yes (Comment)  Care giving concerns:  Pt's care cannot be managed at memory care unit following hospital d/c.   Social Worker assessment / plan:  Pt hospitalized on 09/14/15 after falling at memory care unit at [facility] Surgery required and completed. PN reviewed. PT has recommended SNF placement at d/c. CSW met with pt / spouse to assist with d/c planning. Spouse agrees with plan for rehab prior to pt returning to memory care unit. SNF search has been initiated and bed offers provided. Spouse has chosen [facility] for rehab. SNF has been contacted and d/c plan confirmed. CSW will continue to follow to assist with d/c planning to SNF.    Employment status:  Retired Forensic scientist:  Medicare PT Recommendations:  [place] / Referral to community resources:  [place]  Patient/Family's Response to care:  Spouse feels ST rehab is needed.  Patient/Family's Understanding of and Emotional Response to Diagnosis, Current Treatment,  and Prognosis:  Spouse is aware of pt's medical status. She is pleased [facility] is able to accept pt for rehab prior to returning to memory care unit.  Emotional Assessment Appearance:  Appears stated age Attitude/Demeanor/Rapport:  Other (generally cooperative) Affect (typically observed):  Calm Orientation:  Oriented to Self Alcohol / Substance use:  Not Applicable Psych involvement (Current and /or in the community):  No (Comment)  Discharge Needs  Concerns to be addressed:  Discharge Planning Concerns Readmission within the last 30 days:  No Current discharge risk:  None Barriers to Discharge:  No Barriers Identified   Loraine Maple  [phone] 09/16/2015, 1:58 PM

## 2015-09-17 LAB — BASIC METABOLIC PANEL
ANION GAP: 5 (ref 5–15)
BUN: 29 mg/dL — ABNORMAL HIGH (ref 6–20)
CHLORIDE: 108 mmol/L (ref 101–111)
CO2: 21 mmol/L — ABNORMAL LOW (ref 22–32)
Calcium: 7.6 mg/dL — ABNORMAL LOW (ref 8.9–10.3)
Creatinine, Ser: 1.36 mg/dL — ABNORMAL HIGH (ref 0.61–1.24)
GFR, EST AFRICAN AMERICAN: 52 mL/min — AB (ref >60)
GFR, EST NON AFRICAN AMERICAN: 45 mL/min — AB (ref >60)
Glucose, Bld: 114 mg/dL — ABNORMAL HIGH (ref 65–99)
POTASSIUM: 3.7 mmol/L (ref 3.5–5.1)
SODIUM: 134 mmol/L — AB (ref 135–145)

## 2015-09-17 LAB — CBC
HCT: 25.7 % — ABNORMAL LOW (ref 39.0–52.0)
HEMOGLOBIN: 8.7 g/dL — AB (ref 13.0–17.0)
MCH: 32.8 pg (ref 26.0–34.0)
MCHC: 33.9 g/dL (ref 30.0–36.0)
MCV: 97 fL (ref 78.0–100.0)
PLATELETS: 157 10*3/uL (ref 150–400)
RBC: 2.65 MIL/uL — AB (ref 4.22–5.81)
RDW: 13.4 % (ref 11.5–15.5)
WBC: 12.4 10*3/uL — AB (ref 4.0–10.5)

## 2015-09-17 MED ORDER — ENSURE ENLIVE PO LIQD
237.0000 mL | Freq: Two times a day (BID) | ORAL | Status: DC
  Administered 2015-09-17 – 2015-09-18 (×2): 237 mL via ORAL

## 2015-09-17 NOTE — Care Management Important Message (Signed)
Important Message  Patient Details  Name: Austin Gallagher MRN: [id number] Date of Birth: 1925/07/01   Medicare Important Message Given:  Yes    Camillo Flaming 09/17/2015, 11:37 AMImportant Message  Patient Details  Name: Austin Gallagher MRN: [id number] Date of Birth: 23-Sep-1925   Medicare Important Message Given:  Yes    Camillo Flaming 09/17/2015, 11:37 AM

## 2015-09-17 NOTE — Care Management Note (Signed)
Case Management Note  Patient Details  Name: Mubashir Gere MRN: [id number] Date of Birth: 02/01/1926  Subjective/Objective:                  CANNULATED HIP PINNING RIGHT (Right)  Action/Plan: Discharge planning Expected Discharge Date:   (unknown)               Expected Discharge Plan:  [place]  In-House Referral:     Discharge planning Services  CM Consult  Post Acute Care Choice:    Choice offered to:     DME Arranged:    DME Agency:     HH Arranged:    [place] Agency:     Status of Service:  Completed, signed off  Medicare Important Message Given:  Yes Date Medicare IM Given:    Medicare IM give by:    Date Additional Medicare IM Given:    Additional Medicare Important Message give by:     If discussed at [place] of Stay Meetings, dates discussed:    Additional Comments: CM notes pt to go to SNF; CSW aware and arranging.  No other CM needs were communicated. Dellie Catholic, RN 09/17/2015, 2:17 PM

## 2015-09-17 NOTE — Progress Notes (Signed)
     Subjective: 2 Days Post-Op Procedure(s) (LRB): CANNULATED HIP PINNING RIGHT (Right)   Patient resting comfortably in his chair. Patient pleasantly confused and not even aware where he is or where his wife is.Plan is still SNF upon discharge.  Objective:   VITALS:   Filed Vitals:   09/17/15 0542 09/17/15 0800  BP: 155/56 153/53  Pulse: 60 55  Temp: 98.6 F (37 C) 98.7 F (37.1 C)  Resp: 16 16    Incision: scant drainage No cellulitis present Compartment soft  LABS  Recent Labs  09/15/15 0643 09/16/15 0357 09/17/15 0424  HGB 11.4* 9.5* 8.7*  HCT 32.3* 27.7* 25.7*  WBC 11.8* 12.0* 12.4*  PLT 205 177 157     Recent Labs  09/15/15 0643 09/16/15 0357 09/17/15 0424  NA 129* 133* 134*  K 4.1 3.5 3.7  BUN 34* 28* 29*  CREATININE 2.04* 1.58* 1.36*  GLUCOSE 141* 117* 114*     Assessment/Plan: 2 Days Post-Op Procedure(s) (LRB): CANNULATED HIP PINNING RIGHT (Right) Up with therapy Discharge to SNF eventually, when ready   Ortho recommendations:  ASA 325 mg bid for 4 weeks for anticoagulation, unless other medically indicated.  Tramadol and APAP for pain management (Rx written).  MiraLax and Colace for constipation  Iron 325 mg tid for 2-3 weeks   WBATon the right leg.  Aquacel dressing to remain in place until follow in clinic in 2 weeks.  Dressing is waterproof and may shower with it in place.  Follow up in 2 weeks at [facility]. Follow up with OLIN,Saleh Ulbrich D in 2 weeks.  Contact information:  [facility] [address], [address] [place] Rishit Burkhalter   PAC  09/17/2015, 1:46 PM

## 2015-09-17 NOTE — Progress Notes (Signed)
Physical Therapy Treatment Patient Details Name: Austin Gallagher MRN: [id number] DOB: [birth year] Today's Date: 09/17/2015    History of Present Illness Patient is a 82 yo male with history of CAD s/p stent in 2005, Dementia, HTN, HLD who was brought family as he tripped 09/14/15 while trying to stand up. S/P Closed reduction and percutaneous cannulated screw fixation on 6/13.    PT Comments    POD # 2 Pt very HOH with Hx dementia.  Required repeat simple VC's and increased time to process.  Pt has B hearing Aides but spouse stated they at home in fear of losing them hear.  + 2 assist OOB to amb a limited distance.  Very unsteady gait with poor flex posture.  Pt will need ST Rehab at SNF prior to D/C to home.    Follow Up Recommendations  SNF     Equipment Recommendations       Recommendations for Other Services       Precautions / Restrictions Precautions Precautions: Fall Precaution Comments: HOH     dementia Restrictions Weight Bearing Restrictions: No RLE Weight Bearing: Weight bearing as tolerated    Mobility  Bed Mobility Overal bed mobility: Needs Assistance;+ 2 for safety/equipment;+2 for physical assistance       Supine to sit: Max assist;+2 for physical assistance;+2 for safety/equipment     General bed mobility comments: 75% repeat simple commands pt was able to initiate.  able to transfer from supine to EOB at [facility] Assist then needed Min Assist to sit EOB.    Transfers Overall transfer level: Needs assistance   Transfers: Sit to/from Stand Sit to Stand: +2 safety/equipment;From elevated surface;+2 physical assistance;Max assist         General transfer comment: sit to stand + 2 assist with 75% VC's to increase self assist.  Static standing x 1 min to attempt void in urinal.  Poor standing posture flex hips and knees.  decreased WBing thru R LE.    Ambulation/Gait Ambulation/Gait assistance: Max assist;+2 physical assistance;+2 safety/equipment Ambulation  Distance (Feet): 4 Feet Assistive device: Rolling walker (2 wheeled) Gait Pattern/deviations: Step-to pattern;Decreased step length - right;Decreased step length - left;Decreased stance time - right;Shuffle;Trunk flexed     General Gait Details: very limited amb distance requiring + 3 assist such that recliner was following.     Stairs            Wheelchair Mobility    Modified Rankin (Stroke Patients Only)       Balance                                    Cognition Arousal/Alertness: Awake/alert   Overall Cognitive Status: Impaired/Different from baseline Area of Impairment: Orientation;Following commands;Safety/judgement     Memory: Decreased recall of precautions Following Commands: Follows one step commands inconsistently       General Comments: very HOH and required repeat function one step commands.  Required increased time to process and increased time to activate mvts.      Exercises      General Comments        Pertinent Vitals/Pain Pain Assessment: Faces Faces Pain Scale: Hurts little more Pain Location: R side/hip Pain Descriptors / Indicators: Discomfort;Grimacing Pain Intervention(s): Monitored during session;Premedicated before session;Repositioned    Home Living                      Prior  Function            PT Goals (current goals can now be found in the care plan section) Progress towards PT goals: Progressing toward goals    Frequency  Min 3X/week    PT Plan Current plan remains appropriate    Co-evaluation             End of Session Equipment Utilized During Treatment: Gait belt   Patient left: in chair;with call bell/phone within reach;with family/visitor present     Time: [id number] PT Time Calculation (min) (ACUTE ONLY): 23 min  Charges:  $Gait Training: 8-22 mins $Therapeutic Activity: 8-22 mins                    G Codes:      Rica Koyanagi  PTA [facility]  Acute  Rehab Pager       [phone]

## 2015-09-17 NOTE — Progress Notes (Signed)
Notified [facility] NP Baltazar Najjar about pt being unable to void since foley catheter was discontinued at 545pm yesterday. Bladder scan at 0030 was 172ml. Repeat bladder scan at 0318 was 296ml. Received order to catheterize PRN if unable to void. Will continue to monitor.

## 2015-09-17 NOTE — Progress Notes (Signed)
PROGRESS NOTE        PATIENT DETAILS Name: Austin Gallagher Age: 82 y.o. Sex: male Date of Birth: 19-May-1925 Admit Date: 09/14/2015 Admitting Physician Gennaro Africa, MD [id number], MD  Brief Narrative: Patient is a 82 y.o. male with remote history of CAD requiring PCI, dementia residing in a memory care unit presented with a mechanical fall with resultant right hip fracture. Underwent operative repair on 6/13  Subjective:  no complaints- being fed by his wife  Assessment/Plan: Active Problems: Closed right hip fracture:  Underwent closed reduction and screw fixation  - management per ortho Recommendations:   ASA 325 mg bid for 4 weeks for anticoagulation, unless other medically indicated.  Tramadol and APAP for pain management (Rx written).  MiraLax and Colace for constipation  Iron 325 mg tid for 2-3 weeks   WBATon the right leg.  Aquacel dressing to remain in place until follow in clinic in 2 weeks.  Dressing is waterproof and may shower with it in place. Follow up in 2 weeks at [facility].  Acute renal failure:  -  urinary retention s/p foley-  cont Flomax and foley cath- will need to replace today as he is still retaining  - hold HCTZ/valsartan   Acute urinary retention:  -See above.  History of CAD: - Has a history of remote PCI, but no recent chest pain or shortness of breath. He is able to ambulate in his memory care unit with the help of a walker. Resume aspirin once operative repair is completed, continue metoprolol. Follow. Continue aspirin, metoprolol.  Dementia:  - Pleasantly confused-current mental status close to usual baseline. Continue Aricept and Zoloft.  DVT Prophylaxis: Prophylactic Lovenox   Code Status: Full code   Family Communication: Spouse at bedside  Disposition Plan:   SNF on discharge  Antimicrobial agents: None  Procedures: None  CONSULTS:  orthopedic surgery  Time  spent: 35  MEDICATIONS: Anti-infectives    Start     Dose/Rate Route Frequency Ordered Stop   09/16/15 0200  ceFAZolin (ANCEF) IVPB 2g/100 mL premix     2 g 200 mL/hr over 30 Minutes Intravenous Every 6 hours 09/15/15 2132 09/16/15 0808   09/15/15 0600  ceFAZolin (ANCEF) IVPB 2g/100 mL premix     2 g 200 mL/hr over 30 Minutes Intravenous On call to O.R. 09/14/15 2102 09/15/15 1906      Scheduled Meds: . aspirin EC  325 mg Oral BID  . cholecalciferol  2,000 Units Oral Daily  . docusate sodium  100 mg Oral BID  . donepezil  10 mg Oral Daily  . feeding supplement (ENSURE ENLIVE)  237 mL Oral BID BM  . ferrous sulfate  325 mg Oral TID PC  . metoprolol succinate  12.5 mg Oral q morning - 10a  . pantoprazole  40 mg Oral Daily  . sertraline  50 mg Oral Daily  . tamsulosin  0.4 mg Oral Daily   Continuous Infusions: . sodium chloride 100 mL/hr at 09/17/15 0318   PRN Meds:.alum & mag hydroxide-simeth, bisacodyl, magnesium citrate, menthol-cetylpyridinium **OR** phenol, metoCLOPramide **OR** metoCLOPramide (REGLAN) injection, morphine injection, ondansetron (ZOFRAN) IV, polyethylene glycol, traMADol   PHYSICAL EXAM: Vital signs: Filed Vitals:   09/16/15 2159 09/17/15 0542 09/17/15 0800 09/17/15 1350  BP: 107/51 155/56 153/53 126/51  Pulse: 55 60 55 57  Temp: 99.6 F (37.6 C) 98.6 F (  37 C) 98.7 F (37.1 C) 98.7 F (37.1 C)  TempSrc: Oral Oral Oral Oral  Resp: 16 16 16 15   Height:      SpO2: 96% 98% 98% 95%   There were no vitals filed for this visit. There is no weight on file to calculate BMI.   Gen Exam: Awake, Pleasantly confused. Not in any distress  Neck: Supple, No JVD.   Chest: B/L Clear.   CVS: S1 S2 Regular, no murmurs.  Abdomen: soft, BS +, non tender, non distended. T Extremities: no edema, lower extremities warm to touch. Neurologic: Non Focal.   Skin: No Rash or lesions   Wounds: N/A.   LABORATORY DATA: CBC:  Recent Labs Lab 09/14/15 1421  09/14/15 2252 09/15/15 0643 09/16/15 0357 09/17/15 0424  WBC 14.9* 14.5* 11.8* 12.0* 12.4*  NEUTROABS 12.7*  --  10.3*  --   --   HGB 12.8* 12.5* 11.4* 9.5* 8.7*  HCT 36.7* 35.3* 32.3* 27.7* 25.7*  MCV 92.7 91.7 94.2 93.3 97.0  PLT 232 232 205 177 [id number]    Basic Metabolic Panel:  Recent Labs Lab 09/14/15 1421 09/14/15 2252 09/15/15 0643 09/16/15 0357 09/17/15 0424  NA 131* 129* 129* 133* 134*  K 4.9 3.9 4.1 3.5 3.7  CL 97* 96* 97* 104 108  CO2 26 24 24 22  21*  GLUCOSE 115* 158* 141* 117* 114*  BUN 27* 26* 34* 28* 29*  CREATININE 1.29* 1.44* 2.04* 1.58* 1.36*  CALCIUM 9.3 8.9 8.6* 7.9* 7.6*    GFR: CrCl cannot be calculated (Unknown ideal weight.).  Liver Function Tests: No results for input(s): AST, ALT, ALKPHOS, BILITOT, PROT, ALBUMIN in the last 168 hours. No results for input(s): LIPASE, AMYLASE in the last 168 hours. No results for input(s): AMMONIA in the last 168 hours.  Coagulation Profile:  Recent Labs Lab 09/14/15 2252  INR 1.14    Cardiac Enzymes: No results for input(s): CKTOTAL, CKMB, CKMBINDEX, TROPONINI in the last 168 hours.  BNP (last 3 results) No results for input(s): PROBNP in the last 8760 hours.  HbA1C: No results for input(s): HGBA1C in the last 72 hours.  CBG: No results for input(s): GLUCAP in the last 168 hours.  Lipid Profile: No results for input(s): CHOL, HDL, LDLCALC, TRIG, CHOLHDL, LDLDIRECT in the last 72 hours.  Thyroid Function Tests: No results for input(s): TSH, T4TOTAL, FREET4, T3FREE, THYROIDAB in the last 72 hours.  Anemia Panel: No results for input(s): VITAMINB12, FOLATE, FERRITIN, TIBC, IRON, RETICCTPCT in the last 72 hours.  Urine analysis:    Component Value Date/Time   COLORURINE YELLOW 10/09/2014 1319   APPEARANCEUR CLEAR 10/09/2014 1319   LABSPEC 1.022 10/09/2014 1319   PHURINE 5.5 10/09/2014 1319   GLUCOSEU NEGATIVE 10/09/2014 1319   HGBUR NEGATIVE 10/09/2014 1319   BILIRUBINUR NEGATIVE  10/09/2014 1319   KETONESUR NEGATIVE 10/09/2014 1319   PROTEINUR NEGATIVE 10/09/2014 1319   UROBILINOGEN 1.0 10/09/2014 1319   NITRITE NEGATIVE 10/09/2014 1319   LEUKOCYTESUR NEGATIVE 10/09/2014 1319    Sepsis Labs: Lactic Acid, Venous No results found for: LATICACIDVEN  MICROBIOLOGY: Recent Results (from the past 240 hour(s))  Surgical pcr screen     Status: None   Collection Time: 09/14/15 11:52 PM  Result Value Ref Range Status   MRSA, PCR NEGATIVE NEGATIVE Final   Staphylococcus aureus NEGATIVE NEGATIVE Final    Comment:        The Xpert SA Assay (FDA approved for NASAL specimens in patients over 34 years of age),  is one component of a comprehensive surveillance program.  Test performance has been validated by [facility] for patients greater than or equal to 53 year old. It is not intended to diagnose infection nor to guide or monitor treatment.     RADIOLOGY STUDIES/RESULTS: Dg Pelvis Portable  09/15/2015  CLINICAL DATA:  Preop study for closed right hip fracture. Initial encounter. EXAM: PORTABLE PELVIS 1-2 VIEWS COMPARISON:  Radiography and CT from yesterday FINDINGS: Known right femoral neck fracture without signs of interval displacement. Right femoral neck is foreshortened from leg positioning. No evidence of pelvic ring fracture or diastasis. Osteopenia. Lower abdominal hernia repair with mesh. IMPRESSION: 1. Known right femoral neck fracture without evidence of interval displacement. 2. Intact pelvic ring. Electronically Signed   By: Monte Fantasia M.D.   On: 09/15/2015 13:44   Ct Hip Right Wo Contrast  09/14/2015  CLINICAL DATA:  Status post fall from a standing position this morning. Right hip pain and difficulty walking. Initial encounter. EXAM: CT OF THE RIGHT HIP WITHOUT CONTRAST TECHNIQUE: Multidetector CT imaging of the right hip was performed according to the standard protocol. Multiplanar CT image reconstructions were also generated. COMPARISON:  Plain  films the right hip earlier today. FINDINGS: The patient has a mildly impacted subcapital fracture of the right femoral neck. The fracture shows minimal anterior displacement. No other acute bony or joint abnormality is identified. The right femoral head is located. No lytic or sclerotic bony lesion is seen. There is mild degenerative disease about the right hip. Imaged intrapelvic contents demonstrate no acute abnormality. Atherosclerosis is noted. The patient is status post hernia repair. IMPRESSION: Acute, mildly impacted subcapital fracture right hip with minimal anterior displacement. Electronically Signed   By: Inge Rise M.D.   On: 09/14/2015 16:37   Dg C-arm 61-120 Min-no Report  09/15/2015  CLINICAL DATA: surgery C-ARM 61-120 MINUTES Fluoroscopy was utilized by the requesting physician.  No radiographic interpretation.   Dg Hip Operative Unilat With Pelvis Right  09/15/2015  CLINICAL DATA:  Intraoperative imaging for fixation of a subcapital right hip fracture. EXAM: OPERATIVE RIGHT HIP (WITH PELVIS IF PERFORMED) 4 VIEWS TECHNIQUE: Fluoroscopic spot image(s) were submitted for interpretation post-operatively. COMPARISON:  CT right hip 09/14/2015. FINDINGS: Intraoperative imaging demonstrates placement of 3 screws for fixation of a subcapital fracture. No acute abnormalities identified. IMPRESSION: Intraoperative imaging for fixation of a right hip fracture. Electronically Signed   By: Inge Rise M.D.   On: 09/15/2015 20:10   Dg Hip Unilat  With Pelvis 2-3 Views Right  09/14/2015  CLINICAL DATA:  [address] at nursing home today and landed on right side. Right hip pain after fall. EXAM: DG HIP (WITH OR WITHOUT PELVIS) 2-3V RIGHT COMPARISON:  No comparison studies available. FINDINGS: Frontal pelvis AP and frog-leg lateral views of the right hip show no gross fracture or dislocation. Apparent disruption of the compressive trabecula in the femoral neck raises the question of a nondisplaced  fracture although this is not a definite finding and may represent superimposition of osteophytes SI joints and symphysis pubis are unremarkable. Patient has had prior right lower quadrant ventral mesh placement. IMPRESSION: Possible, but not definite nondisplaced femoral neck fracture. MRI recommended to further evaluate. Electronically Signed   By: Misty Stanley M.D.   On: 09/14/2015 13:04     LOS: 3 days   Debbe Odea, MD  [facility]    If 7PM-7AM, please contact night-coverage [email or web address] Password [facility] 09/17/2015, 3:05 PM

## 2015-09-18 DIAGNOSIS — S72041D Displaced fracture of base of neck of right femur, subsequent encounter for closed fracture with routine healing: Secondary | ICD-10-CM | POA: Diagnosis not present

## 2015-09-18 DIAGNOSIS — R339 Retention of urine, unspecified: Secondary | ICD-10-CM | POA: Diagnosis not present

## 2015-09-18 DIAGNOSIS — F0391 Unspecified dementia with behavioral disturbance: Secondary | ICD-10-CM

## 2015-09-18 DIAGNOSIS — M6281 Muscle weakness (generalized): Secondary | ICD-10-CM | POA: Diagnosis not present

## 2015-09-18 DIAGNOSIS — Z79899 Other long term (current) drug therapy: Secondary | ICD-10-CM | POA: Diagnosis not present

## 2015-09-18 DIAGNOSIS — D649 Anemia, unspecified: Secondary | ICD-10-CM | POA: Diagnosis not present

## 2015-09-18 DIAGNOSIS — Z4789 Encounter for other orthopedic aftercare: Secondary | ICD-10-CM | POA: Diagnosis not present

## 2015-09-18 DIAGNOSIS — E559 Vitamin D deficiency, unspecified: Secondary | ICD-10-CM | POA: Diagnosis not present

## 2015-09-18 DIAGNOSIS — S72009A Fracture of unspecified part of neck of unspecified femur, initial encounter for closed fracture: Secondary | ICD-10-CM | POA: Diagnosis not present

## 2015-09-18 DIAGNOSIS — R400 Somnolence: Secondary | ICD-10-CM | POA: Diagnosis not present

## 2015-09-18 DIAGNOSIS — K5901 Slow transit constipation: Secondary | ICD-10-CM | POA: Diagnosis not present

## 2015-09-18 DIAGNOSIS — R41841 Cognitive communication deficit: Secondary | ICD-10-CM | POA: Diagnosis not present

## 2015-09-18 DIAGNOSIS — G3183 Dementia with Lewy bodies: Secondary | ICD-10-CM | POA: Diagnosis not present

## 2015-09-18 DIAGNOSIS — S7291XA Unspecified fracture of right femur, initial encounter for closed fracture: Secondary | ICD-10-CM

## 2015-09-18 DIAGNOSIS — S72001D Fracture of unspecified part of neck of right femur, subsequent encounter for closed fracture with routine healing: Secondary | ICD-10-CM

## 2015-09-18 DIAGNOSIS — W19XXXA Unspecified fall, initial encounter: Secondary | ICD-10-CM | POA: Diagnosis not present

## 2015-09-18 DIAGNOSIS — F0280 Dementia in other diseases classified elsewhere without behavioral disturbance: Secondary | ICD-10-CM | POA: Diagnosis not present

## 2015-09-18 DIAGNOSIS — F419 Anxiety disorder, unspecified: Secondary | ICD-10-CM | POA: Diagnosis not present

## 2015-09-18 DIAGNOSIS — R531 Weakness: Secondary | ICD-10-CM | POA: Diagnosis not present

## 2015-09-18 DIAGNOSIS — I2510 Atherosclerotic heart disease of native coronary artery without angina pectoris: Secondary | ICD-10-CM | POA: Diagnosis not present

## 2015-09-18 DIAGNOSIS — N179 Acute kidney failure, unspecified: Secondary | ICD-10-CM | POA: Diagnosis not present

## 2015-09-18 DIAGNOSIS — I10 Essential (primary) hypertension: Secondary | ICD-10-CM

## 2015-09-18 DIAGNOSIS — R338 Other retention of urine: Secondary | ICD-10-CM | POA: Diagnosis not present

## 2015-09-18 DIAGNOSIS — S7291XS Unspecified fracture of right femur, sequela: Secondary | ICD-10-CM | POA: Diagnosis not present

## 2015-09-18 DIAGNOSIS — F418 Other specified anxiety disorders: Secondary | ICD-10-CM | POA: Diagnosis not present

## 2015-09-18 DIAGNOSIS — R2681 Unsteadiness on feet: Secondary | ICD-10-CM | POA: Diagnosis not present

## 2015-09-18 DIAGNOSIS — S72141S Displaced intertrochanteric fracture of right femur, sequela: Secondary | ICD-10-CM | POA: Diagnosis not present

## 2015-09-18 DIAGNOSIS — Z9181 History of falling: Secondary | ICD-10-CM | POA: Diagnosis not present

## 2015-09-18 DIAGNOSIS — M25551 Pain in right hip: Secondary | ICD-10-CM | POA: Diagnosis not present

## 2015-09-18 DIAGNOSIS — R262 Difficulty in walking, not elsewhere classified: Secondary | ICD-10-CM | POA: Diagnosis not present

## 2015-09-18 DIAGNOSIS — F329 Major depressive disorder, single episode, unspecified: Secondary | ICD-10-CM | POA: Diagnosis not present

## 2015-09-18 DIAGNOSIS — D62 Acute posthemorrhagic anemia: Secondary | ICD-10-CM | POA: Diagnosis not present

## 2015-09-18 DIAGNOSIS — R600 Localized edema: Secondary | ICD-10-CM | POA: Diagnosis not present

## 2015-09-18 LAB — BASIC METABOLIC PANEL
Anion gap: 5 (ref 5–15)
BUN: 25 mg/dL — AB (ref 6–20)
CALCIUM: 7.9 mg/dL — AB (ref 8.9–10.3)
CO2: 22 mmol/L (ref 22–32)
Chloride: 111 mmol/L (ref 101–111)
Creatinine, Ser: 1 mg/dL (ref 0.61–1.24)
GFR calc Af Amer: >60 mL/min (ref >60)
GLUCOSE: 113 mg/dL — AB (ref 65–99)
Potassium: 4 mmol/L (ref 3.5–5.1)
Sodium: 138 mmol/L (ref 135–145)

## 2015-09-18 LAB — CBC
HCT: 25.2 % — ABNORMAL LOW (ref 39.0–52.0)
Hemoglobin: 8.7 g/dL — ABNORMAL LOW (ref 13.0–17.0)
MCH: 33.3 pg (ref 26.0–34.0)
MCHC: 34.5 g/dL (ref 30.0–36.0)
MCV: 96.6 fL (ref 78.0–100.0)
PLATELETS: 174 10*3/uL (ref 150–400)
RBC: 2.61 MIL/uL — ABNORMAL LOW (ref 4.22–5.81)
RDW: 13.4 % (ref 11.5–15.5)
WBC: 11.8 10*3/uL — ABNORMAL HIGH (ref 4.0–10.5)

## 2015-09-18 MED ORDER — ALUM & MAG HYDROXIDE-SIMETH 200-200-20 MG/5ML PO SUSP: 30.0000 mL | ORAL | Status: AC | PRN

## 2015-09-18 MED ORDER — VALSARTAN 320 MG PO TABS: 320.0000 mg | ORAL_TABLET | Freq: Every day | ORAL | Status: AC

## 2015-09-18 MED ORDER — ENSURE ENLIVE PO LIQD: 237.0000 mL | Freq: Two times a day (BID) | ORAL | Status: DC

## 2015-09-18 MED ORDER — BISACODYL 10 MG RE SUPP: 10.0000 mg | Freq: Every day | RECTAL | Status: AC | PRN

## 2015-09-18 MED ORDER — MAGNESIUM CITRATE PO SOLN: 1.0000 | Freq: Once | ORAL | Status: AC | PRN

## 2015-09-18 MED ORDER — TAMSULOSIN HCL 0.4 MG PO CAPS: 0.4000 mg | ORAL_CAPSULE | Freq: Every day | ORAL | Status: AC

## 2015-09-18 NOTE — Progress Notes (Signed)
RN gave copy of AVS to wife per request.   RN attempted to call report to SNF facility x2. Call was disconnected.   Paperwork and prescriptions given.   [facility] transported patient to SNF facility.

## 2015-09-18 NOTE — Progress Notes (Signed)
     Subjective: 3 Days Post-Op Procedure(s) (LRB): CANNULATED HIP PINNING RIGHT (Right)   Patient resting comfortably in bed, no complaints of pain.  No reported events throughout the night.  Orthopaedically stable.  Objective:   VITALS:   Filed Vitals:   09/18/15 0138 09/18/15 0542  BP:  144/60  Pulse:  60  Temp: 99.7 F (37.6 C) 98.2 F (36.8 C)  Resp:  16    Incision: scant drainage No cellulitis present Compartment soft  LABS  Recent Labs  09/16/15 0357 09/17/15 0424 09/18/15 0351  HGB 9.5* 8.7* 8.7*  HCT 27.7* 25.7* 25.2*  WBC 12.0* 12.4* 11.8*  PLT 177 157 174     Recent Labs  09/16/15 0357 09/17/15 0424 09/18/15 0351  NA 133* 134* 138  K 3.5 3.7 4.0  BUN 28* 29* 25*  CREATININE 1.58* 1.36* 1.00  GLUCOSE 117* 114* 113*     Assessment/Plan: 3 Days Post-Op Procedure(s) (LRB): CANNULATED HIP PINNING RIGHT (Right) Up with therapy Discharge to SNF when ready medically  Ortho recommendations:  ASA 325 mg bid for 4 weeks for anticoagulation, unless other medically indicated.  Tramadol and APAP for pain management (Rx written).  MiraLax and Colace for constipation  Iron 325 mg tid for 2-3 weeks   WBATon the right leg.  Aquacel dressing to remain in place until follow in clinic in 2 weeks.  Dressing is waterproof and may shower with it in place.  Follow up in 2 weeks at [facility]. Follow up with OLIN,Durrel Mcnee D in 2 weeks.  Contact information:  [facility] [address], [address] [place] Tysheena [place]   PAC  09/18/2015, 9:03 AM

## 2015-09-18 NOTE — Discharge Summary (Addendum)
Physician Discharge Summary  Austin Gallagher [id number] DOB: 09-13-1925 DOA: 09/14/2015  PCP: Thressa Sheller, MD  Admit date: 09/14/2015 Discharge date: 09/18/2015  Time spent: 60 minutes  Recommendations for Outpatient Follow-up:  1. Bmet on Tues to f/u Cr 2. Follow urine output - encourage fluids to prevent dehydration 3. Voiding trail when patient is able to sit up without extensive assistance  Discharge Condition: stable    Discharge Diagnoses:  Principal Problem:   Right femoral fracture ([place]) Active Problems:   AKI (acute kidney injury) ([place])   Essential hypertension, benign   Moderate dementia with behavioral disturbance   Urinary retention   History of present illness:  Patient is a 82 y.o. male with remote history of CAD requiring PCI, dementia residing in a memory care unit presented with a mechanical fall with resultant right hip fracture. Underwent operative repair on 6/13  Hospital Course:  Active Problems: Closed right hip fracture:  Underwent closed reduction and screw fixation - management per ortho Recommendations:   ASA 325 mg bid for 4 weeks for anticoagulation, unless other medically indicated.  Tramadol and APAP for pain management    MiraLax and Colace for constipation  Iron 325 mg tid for 2-3 weeks   WBATon the right leg.  Aquacel dressing to remain in place until follow in clinic in 2 weeks.  Dressing is waterproof and may shower with it in place. Follow up in 2 weeks at [facility].  Acute renal failure:  -due to HCTZ/ Valsartan and urinary retention s/p foley - hold HCTZ - resume Valsartan and follow Cr- Bmet on Tuesday  Acute urinary retention:  -- cont Flomax and foley cath- failed voiding trial on 6/14-6/15 and foley replaced - voiding trial in 4-5 days (when mobility has increased)  History of CAD: - Has a history of remote PCI, but no recent chest pain or shortness of breath. He is able to ambulate in his  memory care unit with the help of a walker. Resume aspirin once operative repair is completed, continue metoprolol. Follow. Continue aspirin, metoprolol.  Dementia:  -  Confused at baseline- agitated at times-current mental status close to usual baseline. Continue Aricept and Zoloft. - has not required additional medications for agitation   Procedures: 6/14- closed reduction and screw fixation   Consultations:  Ortho- dr Alvan Dame  Discharge Exam: There were no vitals filed for this visit. Filed Vitals:   09/18/15 0138 09/18/15 0542  BP:  144/60  Pulse:  60  Temp: 99.7 F (37.6 C) 98.2 F (36.8 C)  Resp:  16    General: AAO x 3, no distress Cardiovascular: RRR, no murmurs  Respiratory: clear to auscultation bilaterally GI: soft, non-tender, non-distended, bowel sound positive  Discharge Instructions You were cared for by a hospitalist during your hospital stay. If you have any questions about your discharge medications or the care you received while you were in the hospital after you are discharged, you can call the unit and asked to speak with the hospitalist on call if the hospitalist that took care of you is not available. Once you are discharged, your primary care physician will handle any further medical issues. Please note that NO REFILLS for any discharge medications will be authorized once you are discharged, as it is imperative that you return to your primary care physician (or establish a relationship with a primary care physician if you do not have one) for your aftercare needs so that they can reassess your need for medications and monitor your  lab values.      Discharge Instructions    Diet - low sodium heart healthy    Complete by:  As directed      Discharge instructions    Complete by:  As directed   Low sodium diet- encourage water 4-6 cups a day.     Increase activity slowly    Complete by:  As directed      Weight bearing as tolerated    Complete by:  As  directed   Laterality:  right  Extremity:  Lower            Medication List    STOP taking these medications        aspirin 81 MG tablet  Replaced by:  aspirin 325 MG EC tablet     valsartan-hydrochlorothiazide 320-12.5 MG tablet  Commonly known as:  DIOVAN-HCT      TAKE these medications        acetaminophen 500 MG tablet  Commonly known as:  TYLENOL  Take 500 mg by mouth every 6 (six) hours as needed for moderate pain or fever.     ALPRAZolam 0.25 MG tablet  Commonly known as:  XANAX  Take 0.25 mg by mouth every evening. May take an additional 0.125mg  every 4 hours as needed for anxiety     alum & mag hydroxide-simeth 200-200-20 MG/5ML suspension  Commonly known as:  MAALOX/MYLANTA  Take 30 mLs by mouth every 4 (four) hours as needed for indigestion.     aspirin 325 MG EC tablet  Take 1 tablet (325 mg total) by mouth 2 (two) times daily.     bisacodyl 10 MG suppository  Commonly known as:  DULCOLAX  Place 1 suppository (10 mg total) rectally daily as needed for moderate constipation.     docusate sodium 100 MG capsule  Commonly known as:  COLACE  Take 1 capsule (100 mg total) by mouth 2 (two) times daily.     donepezil 10 MG tablet  Commonly known as:  ARICEPT  Take 10 mg by mouth daily.     feeding supplement (ENSURE ENLIVE) Liqd  Take 237 mLs by mouth 2 (two) times daily between meals.     ferrous sulfate 325 (65 FE) MG tablet  Take 1 tablet (325 mg total) by mouth 3 (three) times daily after meals.     magnesium citrate Soln  Take 296 mLs (1 Bottle total) by mouth once as needed for severe constipation.     metoprolol succinate 25 MG 24 hr tablet  Commonly known as:  TOPROL-XL  Take 12.5 mg by mouth every morning.     polyethylene glycol packet  Commonly known as:  MIRALAX / GLYCOLAX  Take 17 g by mouth daily as needed for mild constipation.     sertraline 25 MG tablet  Commonly known as:  ZOLOFT  Take 25 mg by mouth daily.     sertraline 50  MG tablet  Commonly known as:  ZOLOFT  Take 50 mg by mouth daily.     tamsulosin 0.4 MG Caps capsule  Commonly known as:  FLOMAX  Take 1 capsule (0.4 mg total) by mouth daily.     traMADol 50 MG tablet  Commonly known as:  ULTRAM  Take 1 tablet (50 mg total) by mouth every 3 (three) hours as needed for severe pain.     valsartan 320 MG tablet  Commonly known as:  DIOVAN  Take 1 tablet (320 mg total) by mouth daily.  Vitamin D3 2000 units capsule  Take 2,000 Units by mouth daily.       Allergies  Allergen Reactions  . Ace Inhibitors     Documented on MAR  . Amlodipine Other (See Comments)    REACTION: ankles itching   Follow-up Information    Follow up with Mauri Pole, MD. Schedule an appointment as soon as possible for a visit in 2 weeks.   Specialty:  Orthopedic Surgery   Contact information:   [address] [place] [address] [phone]       Follow up with Mauri Pole, MD. Schedule an appointment as soon as possible for a visit in 2 weeks.   Specialty:  Orthopedic Surgery   Contact information:   [address] [place] [address] [phone]        The results of significant diagnostics from this hospitalization (including imaging, microbiology, ancillary and laboratory) are listed below for reference.    Significant Diagnostic Studies: Dg Pelvis Portable  09/15/2015  CLINICAL DATA:  Preop study for closed right hip fracture. Initial encounter. EXAM: PORTABLE PELVIS 1-2 VIEWS COMPARISON:  Radiography and CT from yesterday FINDINGS: Known right femoral neck fracture without signs of interval displacement. Right femoral neck is foreshortened from leg positioning. No evidence of pelvic ring fracture or diastasis. Osteopenia. Lower abdominal hernia repair with mesh. IMPRESSION: 1. Known right femoral neck fracture without evidence of interval displacement. 2. Intact pelvic ring. Electronically Signed   By: Monte Fantasia M.D.   On: 09/15/2015 13:44   Ct Hip Right Wo Contrast  09/14/2015  CLINICAL DATA:  Status post fall from a standing position this morning. Right hip pain and difficulty walking. Initial encounter. EXAM: CT OF THE RIGHT HIP WITHOUT CONTRAST TECHNIQUE: Multidetector CT imaging of the right hip was performed according to the standard protocol. Multiplanar CT image reconstructions were also generated. COMPARISON:  Plain films the right hip earlier today. FINDINGS: The patient has a mildly impacted subcapital fracture of the right femoral neck. The fracture shows minimal anterior displacement. No other acute bony or joint abnormality is identified. The right femoral head is located. No lytic or sclerotic bony lesion is seen. There is mild degenerative disease about the right hip. Imaged intrapelvic contents demonstrate no acute abnormality. Atherosclerosis is noted. The patient is status post hernia repair. IMPRESSION: Acute, mildly impacted subcapital fracture right hip with minimal anterior displacement. Electronically Signed   By: Inge Rise M.D.   On: 09/14/2015 16:37   Dg C-arm 61-120 Min-no Report  09/15/2015  CLINICAL DATA: surgery C-ARM 61-120 MINUTES Fluoroscopy was utilized by the requesting physician.  No radiographic interpretation.   Dg Hip Operative Unilat With Pelvis Right  09/15/2015  CLINICAL DATA:  Intraoperative imaging for fixation of a subcapital right hip fracture. EXAM: OPERATIVE RIGHT HIP (WITH PELVIS IF PERFORMED) 4 VIEWS TECHNIQUE: Fluoroscopic spot image(s) were submitted for interpretation post-operatively. COMPARISON:  CT right hip 09/14/2015. FINDINGS: Intraoperative imaging demonstrates placement of 3 screws for fixation of a subcapital fracture. No acute abnormalities identified. IMPRESSION: Intraoperative imaging for fixation of a right hip fracture. Electronically Signed   By: Inge Rise M.D.   On: 09/15/2015 20:10   Dg Hip Unilat  With Pelvis 2-3 Views  Right  09/14/2015  CLINICAL DATA:  [address] at nursing home today and landed on right side. Right hip pain after fall. EXAM: DG HIP (WITH OR WITHOUT PELVIS) 2-3V RIGHT COMPARISON:  No comparison studies available. FINDINGS: Frontal pelvis AP  and frog-leg lateral views of the right hip show no gross fracture or dislocation. Apparent disruption of the compressive trabecula in the femoral neck raises the question of a nondisplaced fracture although this is not a definite finding and may represent superimposition of osteophytes SI joints and symphysis pubis are unremarkable. Patient has had prior right lower quadrant ventral mesh placement. IMPRESSION: Possible, but not definite nondisplaced femoral neck fracture. MRI recommended to further evaluate. Electronically Signed   By: Misty Stanley M.D.   On: 09/14/2015 13:04    Microbiology: Recent Results (from the past 240 hour(s))  Surgical pcr screen     Status: None   Collection Time: 09/14/15 11:52 PM  Result Value Ref Range Status   MRSA, PCR NEGATIVE NEGATIVE Final   Staphylococcus aureus NEGATIVE NEGATIVE Final    Comment:        The Xpert SA Assay (FDA approved for NASAL specimens in patients over 41 years of age), is one component of a comprehensive surveillance program.  Test performance has been validated by [facility] for patients greater than or equal to 24 year old. It is not intended to diagnose infection nor to guide or monitor treatment.      Labs: Basic Metabolic Panel:  Recent Labs Lab 09/14/15 2252 09/15/15 0643 09/16/15 0357 09/17/15 0424 09/18/15 0351  NA 129* 129* 133* 134* 138  K 3.9 4.1 3.5 3.7 4.0  CL 96* 97* 104 108 111  CO2 24 24 22  21* 22  GLUCOSE 158* 141* 117* 114* 113*  BUN 26* 34* 28* 29* 25*  CREATININE 1.44* 2.04* 1.58* 1.36* 1.00  CALCIUM 8.9 8.6* 7.9* 7.6* 7.9*   Liver Function Tests: No results for input(s): AST, ALT, ALKPHOS, BILITOT, PROT, ALBUMIN in the last 168 hours. No results for  input(s): LIPASE, AMYLASE in the last 168 hours. No results for input(s): AMMONIA in the last 168 hours. CBC:  Recent Labs Lab 09/14/15 1421 09/14/15 2252 09/15/15 0643 09/16/15 0357 09/17/15 0424 09/18/15 0351  WBC 14.9* 14.5* 11.8* 12.0* 12.4* 11.8*  NEUTROABS 12.7*  --  10.3*  --   --   --   HGB 12.8* 12.5* 11.4* 9.5* 8.7* 8.7*  HCT 36.7* 35.3* 32.3* 27.7* 25.7* 25.2*  MCV 92.7 91.7 94.2 93.3 97.0 96.6  PLT 232 232 205 177 157 174   Cardiac Enzymes: No results for input(s): CKTOTAL, CKMB, CKMBINDEX, TROPONINI in the last 168 hours. BNP: BNP (last 3 results) No results for input(s): BNP in the last 8760 hours.  ProBNP (last 3 results) No results for input(s): PROBNP in the last 8760 hours.  CBG: No results for input(s): GLUCAP in the last 168 hours.     SignedDebbe Odea, MD [facility] 09/18/2015, 10:08 AM

## 2015-09-18 NOTE — Clinical Social Work Placement (Signed)
   CLINICAL SOCIAL WORK PLACEMENT  NOTE  Date:  09/18/2015  Patient Details  Name: Austin Gallagher MRN: [id number] Date of Birth: 1926-02-24  Clinical Social Work is seeking post-discharge placement for this patient at the [place] level of care (*CSW will initial, date and re-position this form in  chart as items are completed):  Yes   Patient/family provided with [place] Work Department's list of facilities offering this level of care within the geographic area requested by the patient (or if unable, by the patient's family).  Yes   Patient/family informed of their freedom to choose among providers that offer the needed level of care, that participate in Medicare, Medicaid or managed care program needed by the patient, have an available bed and are willing to accept the patient.  Yes   Patient/family informed of [place]'s ownership interest in [facility] and [facility], as well as of the fact that they are under no obligation to receive care at these facilities.  PASRR submitted to EDS on 09/15/15     PASRR number received on 09/15/15     Existing PASRR number confirmed on       FL2 transmitted to all facilities in geographic area requested by pt/family on 09/15/15     FL2 transmitted to all facilities within larger geographic area on       Patient informed that his/her managed care company has contracts with or will negotiate with certain facilities, including the following:        Yes   Patient/family informed of bed offers received.  Patient chooses bed at [facility]     Physician recommends and patient chooses bed at      Patient to be transferred to [facility] on 09/18/15.  Patient to be transferred to facility by [facility]     Patient family notified on 09/18/15 of transfer.  Name of family member notified:  SPOUSE     PHYSICIAN       Additional Comment: Pt / spouse are in agreement with d/c to [facility] today.  [facility] transport is required. Pt requires oxygen. Medical necessity form completed. Spouse is aware out of pocket costs may be associated with [facility] transport. Scripts included in d/c packet. D/C Summary sent to SNF for review. # for report provided to nsg.   _______________________________________________ Luretha Rued, [place]  [phone] 09/18/2015, 12:09 PM

## 2015-09-21 ENCOUNTER — Non-Acute Institutional Stay (SKILLED_NURSING_FACILITY): Payer: Medicare Other | Admitting: Adult Health

## 2015-09-21 ENCOUNTER — Encounter: Payer: Self-pay | Admitting: Adult Health

## 2015-09-21 DIAGNOSIS — F419 Anxiety disorder, unspecified: Secondary | ICD-10-CM | POA: Diagnosis not present

## 2015-09-21 DIAGNOSIS — I2510 Atherosclerotic heart disease of native coronary artery without angina pectoris: Secondary | ICD-10-CM | POA: Diagnosis not present

## 2015-09-21 DIAGNOSIS — K5901 Slow transit constipation: Secondary | ICD-10-CM

## 2015-09-21 DIAGNOSIS — D62 Acute posthemorrhagic anemia: Secondary | ICD-10-CM

## 2015-09-21 DIAGNOSIS — R2681 Unsteadiness on feet: Secondary | ICD-10-CM | POA: Diagnosis not present

## 2015-09-21 DIAGNOSIS — F0280 Dementia in other diseases classified elsewhere without behavioral disturbance: Secondary | ICD-10-CM

## 2015-09-21 DIAGNOSIS — E559 Vitamin D deficiency, unspecified: Secondary | ICD-10-CM | POA: Diagnosis not present

## 2015-09-21 DIAGNOSIS — F329 Major depressive disorder, single episode, unspecified: Secondary | ICD-10-CM

## 2015-09-21 DIAGNOSIS — G3183 Dementia with Lewy bodies: Secondary | ICD-10-CM

## 2015-09-21 DIAGNOSIS — S7291XS Unspecified fracture of right femur, sequela: Secondary | ICD-10-CM

## 2015-09-21 DIAGNOSIS — I10 Essential (primary) hypertension: Secondary | ICD-10-CM

## 2015-09-21 DIAGNOSIS — R339 Retention of urine, unspecified: Secondary | ICD-10-CM | POA: Diagnosis not present

## 2015-09-21 DIAGNOSIS — F32A Depression, unspecified: Secondary | ICD-10-CM

## 2015-09-21 NOTE — Progress Notes (Signed)
Patient ID: Austin Gallagher, male   DOB: 07-06-1925, 82 y.o.   MRN: [id number]    DATE:  09/21/2015   MRN:  [id number]  BIRTHDAY: [birth year]  Facility:  Nursing Home Location:  [place] Room Number: [id number]  LEVEL OF CARE:  SNF (31)  Contact Information    Name Relation Home Work Mobile   [place] Spouse [phone]  [phone]       Code Status History    Date Active Date Inactive Code Status Order ID Comments User Context   09/14/2015  7:30 PM 09/18/2015  2:34 PM Full Code [id number]  Gennaro Africa, MD ED   02/04/2013  1:43 AM 02/05/2013  5:57 PM Full Code [id number]  Etta Quill, DO ED   06/08/2012  2:49 PM 06/11/2012  5:02 PM DNR [id number]  Elmarie Shiley, MD Inpatient       Chief Complaint  Patient presents with  . Hospitalization Follow-up    HISTORY OF PRESENT ILLNESS:  This is an 82 year old male who has been admitted to [facility] on 09/18/15 from [facility]. He had a fall while in a restaurant and sustained a right hip fracture for which he had closed reduction and screw fixation on 09/15/15. He has been admitted for a short-term rehabilitation.   PAST MEDICAL HISTORY:  Past Medical History  Diagnosis Date  . CAD (coronary artery disease)     Stent to RCA in 2005  . Hyperlipidemia   . Essential hypertension, benign   . CORONARY ATHEROSCLEROSIS, NATIVE VESSEL   . Dementia      CURRENT MEDICATIONS: Reviewed  Patient's Medications  New Prescriptions   No medications on file  Previous Medications   ACETAMINOPHEN (TYLENOL) 500 MG TABLET    Take 500 mg by mouth every 6 (six) hours as needed for moderate pain or fever.   ALPRAZOLAM (XANAX) 0.25 MG TABLET    Take 0.25 mg by mouth every evening. May take an additional 0.125mg  every 4 hours as needed for anxiety   ALUM & MAG HYDROXIDE-SIMETH (MAALOX/MYLANTA) 200-200-20 MG/5ML SUSPENSION    Take 30 mLs by mouth every 4 (four) hours as needed for indigestion.    ASPIRIN EC 325 MG EC TABLET    Take 1 tablet (325 mg total) by mouth 2 (two) times daily.   BISACODYL (DULCOLAX) 10 MG SUPPOSITORY    Place 1 suppository (10 mg total) rectally daily as needed for moderate constipation.   CHOLECALCIFEROL (VITAMIN D3) 2000 UNITS CAPSULE    Take 2,000 Units by mouth daily.   DOCUSATE SODIUM (COLACE) 100 MG CAPSULE    Take 1 capsule (100 mg total) by mouth 2 (two) times daily.   DONEPEZIL (ARICEPT) 10 MG TABLET    Take 10 mg by mouth daily.    FERROUS SULFATE 325 (65 FE) MG TABLET    Take 1 tablet (325 mg total) by mouth 3 (three) times daily after meals.   MAGNESIUM CITRATE SOLN    Take 296 mLs (1 Bottle total) by mouth once as needed for severe constipation.   METOPROLOL SUCCINATE (TOPROL-XL) 25 MG 24 HR TABLET    Take 12.5 mg by mouth every morning.   POLYETHYLENE GLYCOL (MIRALAX / GLYCOLAX) PACKET    Take 17 g by mouth daily as needed for mild constipation.   SERTRALINE (ZOLOFT) 50 MG TABLET    Take 50 mg by mouth daily.   TAMSULOSIN (FLOMAX) 0.4 MG CAPS CAPSULE    Take 1  capsule (0.4 mg total) by mouth daily.   TRAMADOL (ULTRAM) 50 MG TABLET    Take 1 tablet (50 mg total) by mouth every 3 (three) hours as needed for severe pain.   UNABLE TO FIND    Med Name: MedPass 120 mL po BID between meals for supplement   VALSARTAN (DIOVAN) 320 MG TABLET    Take 1 tablet (320 mg total) by mouth daily.  Modified Medications   No medications on file  Discontinued Medications   FEEDING SUPPLEMENT, ENSURE ENLIVE, (ENSURE ENLIVE) LIQD    Take 237 mLs by mouth 2 (two) times daily between meals.   SERTRALINE (ZOLOFT) 25 MG TABLET    Take 25 mg by mouth daily.      Allergies  Allergen Reactions  . Ace Inhibitors     Documented on MAR  . Amlodipine Other (See Comments)    REACTION: ankles itching     REVIEW OF SYSTEMS:  Unable to obtain due to dementia   PHYSICAL EXAMINATION  GENERAL APPEARANCE: Well nourished. In no acute distress. Normal body habitus SKIN:  Right  hip surgical incision is covered with aquacel dressing, dry, no erythema  HEAD: Normal in size and contour. No evidence of trauma EYES: Lids open and close normally. No blepharitis, entropion or ectropion. PERRL. Conjunctivae are clear and sclerae are white. Lenses are without opacity EARS: Pinnae are normal. Patient hears normal voice tunes of the examiner MOUTH and THROAT: Lips are without lesions. Oral mucosa is moist and without lesions. Tongue is normal in shape, size, and color and without lesions NECK: supple, trachea midline, no neck masses, no thyroid tenderness, no thyromegaly LYMPHATICS: no LAN in the neck, no supraclavicular LAN RESPIRATORY: breathing is even & unlabored, BS CTAB CARDIAC: RRR, no murmur,no extra heart sounds, no edema GI: abdomen soft, normal BS, no masses, no tenderness, no hepatomegaly, no splenomegaly GU:   Has foley catheter draining to urine bag with clear yellowish urine EXTREMITIES:  Able to move X 4 extremities  PSYCHIATRIC: Alert to person, disoriented to time and place. Affect and behavior are appropriate  LABS/RADIOLOGY: Labs reviewed: Basic Metabolic Panel:  Recent Labs  09/16/15 0357 09/17/15 0424 09/18/15 0351  NA 133* 134* 138  K 3.5 3.7 4.0  CL 104 108 111  CO2 22 21* 22  GLUCOSE 117* 114* 113*  BUN 28* 29* 25*  CREATININE 1.58* 1.36* 1.00  CALCIUM 7.9* 7.6* 7.9*   CBC:  Recent Labs  09/14/15 1421  09/15/15 0643 09/16/15 0357 09/17/15 0424 09/18/15 0351  WBC 14.9*  < > 11.8* 12.0* 12.4* 11.8*  NEUTROABS 12.7*  --  10.3*  --   --   --   HGB 12.8*  < > 11.4* 9.5* 8.7* 8.7*  HCT 36.7*  < > 32.3* 27.7* 25.7* 25.2*  MCV 92.7  < > 94.2 93.3 97.0 96.6  PLT 232  < > 205 177 157 174  < > = values in this interval not displayed.   Dg Pelvis Portable  09/15/2015  CLINICAL DATA:  Preop study for closed right hip fracture. Initial encounter. EXAM: PORTABLE PELVIS 1-2 VIEWS COMPARISON:  Radiography and CT from yesterday FINDINGS: Known  right femoral neck fracture without signs of interval displacement. Right femoral neck is foreshortened from leg positioning. No evidence of pelvic ring fracture or diastasis. Osteopenia. Lower abdominal hernia repair with mesh. IMPRESSION: 1. Known right femoral neck fracture without evidence of interval displacement. 2. Intact pelvic ring. Electronically Signed   By: Angelica Chessman  Watts M.D.   On: 09/15/2015 13:44   Ct Hip Right Wo Contrast  09/14/2015  CLINICAL DATA:  Status post fall from a standing position this morning. Right hip pain and difficulty walking. Initial encounter. EXAM: CT OF THE RIGHT HIP WITHOUT CONTRAST TECHNIQUE: Multidetector CT imaging of the right hip was performed according to the standard protocol. Multiplanar CT image reconstructions were also generated. COMPARISON:  Plain films the right hip earlier today. FINDINGS: The patient has a mildly impacted subcapital fracture of the right femoral neck. The fracture shows minimal anterior displacement. No other acute bony or joint abnormality is identified. The right femoral head is located. No lytic or sclerotic bony lesion is seen. There is mild degenerative disease about the right hip. Imaged intrapelvic contents demonstrate no acute abnormality. Atherosclerosis is noted. The patient is status post hernia repair. IMPRESSION: Acute, mildly impacted subcapital fracture right hip with minimal anterior displacement. Electronically Signed   By: Inge Rise M.D.   On: 09/14/2015 16:37   Dg C-arm 61-120 Min-no Report  09/15/2015  CLINICAL DATA: surgery C-ARM 61-120 MINUTES Fluoroscopy was utilized by the requesting physician.  No radiographic interpretation.   Dg Hip Operative Unilat With Pelvis Right  09/15/2015  CLINICAL DATA:  Intraoperative imaging for fixation of a subcapital right hip fracture. EXAM: OPERATIVE RIGHT HIP (WITH PELVIS IF PERFORMED) 4 VIEWS TECHNIQUE: Fluoroscopic spot image(s) were submitted for interpretation  post-operatively. COMPARISON:  CT right hip 09/14/2015. FINDINGS: Intraoperative imaging demonstrates placement of 3 screws for fixation of a subcapital fracture. No acute abnormalities identified. IMPRESSION: Intraoperative imaging for fixation of a right hip fracture. Electronically Signed   By: Inge Rise M.D.   On: 09/15/2015 20:10   Dg Hip Unilat  With Pelvis 2-3 Views Right  09/14/2015  CLINICAL DATA:  [address] at nursing home today and landed on right side. Right hip pain after fall. EXAM: DG HIP (WITH OR WITHOUT PELVIS) 2-3V RIGHT COMPARISON:  No comparison studies available. FINDINGS: Frontal pelvis AP and frog-leg lateral views of the right hip show no gross fracture or dislocation. Apparent disruption of the compressive trabecula in the femoral neck raises the question of a nondisplaced fracture although this is not a definite finding and may represent superimposition of osteophytes SI joints and symphysis pubis are unremarkable. Patient has had prior right lower quadrant ventral mesh placement. IMPRESSION: Possible, but not definite nondisplaced femoral neck fracture. MRI recommended to further evaluate. Electronically Signed   By: Misty Stanley M.D.   On: 09/14/2015 13:04    ASSESSMENT/PLAN:  Unsteady gait - for rehabilitation  Closed right hip fracture S/P closed reduction and screw fixation - for rehabilitation; continue aspirin 325 mg 1 tab by mouth twice a day 4 weeks for DVT prophylaxis; tramadol 50 mg 1 tab by mouth every 3 hours when necessary and acetaminophen 500 mg 1 tab by mouth every 6 hours when necessary for pain; RLE WBAT; follow-up with orthopedic surgeon in 2 weeks  Urinary retention - continue Flomax 0.4 mg 1 capsule by mouth daily; discontinue Foley catheter in a.m. and monitor for urinary retention  CAD - continue aspirin 325 mg twice a day 4 weeks and metoprolol succinate ER 25 mg 1/2 tab by mouth daily  Dementia - continue Aricept 10 mg 1 tab by mouth  daily  Depression - mood is stable; continue sertraline 50 mg 1 tab by mouth daily  Hypertension - continue metoprolol succinate 25 mg 24 hour 1/2 tab by mouth every morning and Valsartan 320  mg 1 tab by mouth daily; check CMP  Constipation - change MiraLAX to 17 g by mouth daily, discontinue Colace, start senna S 2 tabs by mouth twice a day, Dulcolax suppository 10 mg 1 per rectum daily when necessary  Anxiety - continue Xanax 0.25 mg 1 tab by mouth every evening and 0.125 mg by mouth every 4 hours when necessary  Anemia, acute blood loss - continue for sulfate 325 mg 1 tab by mouth 3 times a day; check CBC Lab Results  Component Value Date   WBC 11.8* 09/18/2015   HGB 8.7* 09/18/2015   HCT 25.2* 09/18/2015   MCV 96.6 09/18/2015   PLT 174 09/18/2015   Vitamin D deficiency -  continue vitamin D3  2000 units 1 tab by mouth daily      Goals of care:  Short-term rehabilitation    Durenda Age, NP [place] [phone]

## 2015-09-22 ENCOUNTER — Non-Acute Institutional Stay (SKILLED_NURSING_FACILITY): Payer: Medicare Other | Admitting: Internal Medicine

## 2015-09-22 ENCOUNTER — Encounter: Payer: Self-pay | Admitting: Internal Medicine

## 2015-09-22 DIAGNOSIS — G3183 Dementia with Lewy bodies: Secondary | ICD-10-CM

## 2015-09-22 DIAGNOSIS — S72141S Displaced intertrochanteric fracture of right femur, sequela: Secondary | ICD-10-CM

## 2015-09-22 DIAGNOSIS — R600 Localized edema: Secondary | ICD-10-CM | POA: Diagnosis not present

## 2015-09-22 DIAGNOSIS — F418 Other specified anxiety disorders: Secondary | ICD-10-CM

## 2015-09-22 DIAGNOSIS — R531 Weakness: Secondary | ICD-10-CM | POA: Diagnosis not present

## 2015-09-22 DIAGNOSIS — I2510 Atherosclerotic heart disease of native coronary artery without angina pectoris: Secondary | ICD-10-CM

## 2015-09-22 DIAGNOSIS — R338 Other retention of urine: Secondary | ICD-10-CM | POA: Diagnosis not present

## 2015-09-22 DIAGNOSIS — D649 Anemia, unspecified: Secondary | ICD-10-CM | POA: Diagnosis not present

## 2015-09-22 DIAGNOSIS — F0280 Dementia in other diseases classified elsewhere without behavioral disturbance: Secondary | ICD-10-CM

## 2015-09-22 LAB — BASIC METABOLIC PANEL
BUN: 26 mg/dL — AB (ref 4–21)
CREATININE: 1 mg/dL (ref 0.6–1.3)
Glucose: 97 mg/dL
Potassium: 4.1 mmol/L (ref 3.4–5.3)
SODIUM: 141 mmol/L (ref 137–147)

## 2015-09-22 LAB — HEPATIC FUNCTION PANEL
ALK PHOS: 55 U/L (ref 25–125)
ALT: 17 U/L (ref 10–40)
AST: 24 U/L (ref 14–40)
BILIRUBIN, TOTAL: 1 mg/dL

## 2015-09-22 LAB — CBC AND DIFFERENTIAL
HCT: 27 % — AB (ref 41–53)
HEMOGLOBIN: 8.9 g/dL — AB (ref 13.5–17.5)
NEUTROS ABS: 10 /uL
PLATELETS: 335 10*3/uL (ref 150–399)
WBC: 13.7 10^3/mL

## 2015-09-22 NOTE — Progress Notes (Signed)
LOCATION: [facility]  PCP: Thressa Sheller, MD   Code Status: Full Code  Goals of care: Advanced Directive information Advanced Directives 09/14/2015  Does patient have an advance directive? Yes  Type of Paramedic of [place];Living will  Copy of advanced directive(s) in chart? No - copy requested       Extended Emergency Contact Information Primary Emergency Contact: [facility] Address: [address]          [place], [place] [address] [place] Phone: [phone] Mobile Phone: [phone] Relation: Spouse   Allergies  Allergen Reactions  . Ace Inhibitors     Documented on MAR  . Amlodipine Other (See Comments)    REACTION: ankles itching    Chief Complaint  Patient presents with  . New Admit To SNF    New Admission     HPI:  Patient is a 82 y.o. male seen today for short term rehabilitation post hospital admission from a fall with right hip fracture. He underwent closed reduction and screw fixation on 09/15/15. He had AKI and his HCTZ was held. He also had acute urinary retention and was started on flomax and had a foley catheter placed. He is seen in his room today.   Review of Systems:  Constitutional: Negative for fever, chills, diaphoresis.  HENT: Negative for headache, congestion, sore throat, difficulty swallowing. Positive for occasional nasal discharge.  Eyes: Negative for blurred vision, double vision and discharge.  Respiratory: Negative for shortness of breath and wheezing. Positive for occasional cough.  Cardiovascular: Negative for chest pain, palpitations, leg swelling.  Gastrointestinal: Negative for heartburn, nausea, vomiting, abdominal pain. Last bowel movement was today. Genitourinary: Negative for pain. Foley catheter has been removed and he has been voiding.  Musculoskeletal: Negative for back pain, fall.  Skin: Negative for itching, rash.  Neurological: Negative for  dizziness. Psychiatric/Behavioral: Negative for depression   Past Medical History  Diagnosis Date  . CAD (coronary artery disease)     Stent to RCA in 2005  . Hyperlipidemia   . Essential hypertension, benign   . CORONARY ATHEROSCLEROSIS, NATIVE VESSEL   . Dementia    Past Surgical History  Procedure Laterality Date  . Replacement total knee bilateral    . Inguinal hernia repair    . Coronary angioplasty with stent placement  2005  . Hip pinning,cannulated Right 09/15/2015    Procedure: CANNULATED HIP PINNING RIGHT;  Surgeon: Paralee Cancel, MD;  Location: [facility];  Service: Orthopedics;  Laterality: Right;   Social History:   reports that he has never smoked. He does not have any smokeless tobacco history on file. He reports that he does not drink alcohol or use illicit drugs.  No family history on file.  Medications:   Medication List       This list is accurate as of: 09/22/15  4:45 PM.  Always use your most recent med list.               acetaminophen 500 MG tablet  Commonly known as:  TYLENOL  Take 500 mg by mouth every 6 (six) hours as needed for moderate pain or fever.     ALPRAZolam 0.25 MG tablet  Commonly known as:  XANAX  Take 0.25 mg by mouth every evening. May take an additional 0.125mg  every 4 hours as needed for anxiety     alum & mag hydroxide-simeth 200-200-20 MG/5ML suspension  Commonly known as:  MAALOX/MYLANTA  Take 30 mLs by mouth every  4 (four) hours as needed for indigestion.     aspirin 325 MG EC tablet  Take 1 tablet (325 mg total) by mouth 2 (two) times daily.     bisacodyl 10 MG suppository  Commonly known as:  DULCOLAX  Place 1 suppository (10 mg total) rectally daily as needed for moderate constipation.     docusate sodium 100 MG capsule  Commonly known as:  COLACE  Take 1 capsule (100 mg total) by mouth 2 (two) times daily.     donepezil 10 MG tablet  Commonly known as:  ARICEPT  Take 10 mg by mouth daily.     ferrous sulfate  325 (65 FE) MG tablet  Take 1 tablet (325 mg total) by mouth 3 (three) times daily after meals.     magnesium citrate Soln  Take 296 mLs (1 Bottle total) by mouth once as needed for severe constipation.     metoprolol succinate 25 MG 24 hr tablet  Commonly known as:  TOPROL-XL  Take 12.5 mg by mouth every morning.     polyethylene glycol packet  Commonly known as:  MIRALAX / GLYCOLAX  Take 17 g by mouth daily as needed for mild constipation.     sertraline 50 MG tablet  Commonly known as:  ZOLOFT  Take 50 mg by mouth daily.     tamsulosin 0.4 MG Caps capsule  Commonly known as:  FLOMAX  Take 1 capsule (0.4 mg total) by mouth daily.     traMADol 50 MG tablet  Commonly known as:  ULTRAM  Take 1 tablet (50 mg total) by mouth every 3 (three) hours as needed for severe pain.     UNABLE TO FIND  Med Name: MedPass 120 mL po BID between meals for supplement     valsartan 320 MG tablet  Commonly known as:  DIOVAN  Take 1 tablet (320 mg total) by mouth daily.     Vitamin D3 2000 units capsule  Take 2,000 Units by mouth daily.        Immunizations:  There is no immunization history on file for this patient.   Physical Exam:  Filed Vitals:   09/22/15 1641  BP: 151/76  Pulse: 75  Temp: 97.7 F (36.5 C)  TempSrc: Oral  Resp: 22  SpO2: 93%   There is no weight on file to calculate BMI.  General- elderly male, well built, in no acute distress Head- normocephalic, atraumatic Nose- no nasal discharge Throat- moist mucus membrane  Eyes- PERRLA, EOMI, no pallor, no icterus Neck- no cervical lymphadenopathy Cardiovascular- normal s1,s2, no murmur, trace left ad 1 + right leg edema Respiratory- bilateral clear to auscultation, no wheeze, no rhonchi, no crackles, no use of accessory muscles Abdomen- bowel sounds present, soft, non tender Musculoskeletal- able to move all 4 extremities, limited right hip range of motion Neurological- alert and oriented  Skin- warm and  dry, skin tear to right elbow Psychiatry- normal mood and affect    Labs reviewed: Basic Metabolic Panel:  Recent Labs  09/16/15 0357 09/17/15 0424 09/18/15 0351  NA 133* 134* 138  K 3.5 3.7 4.0  CL 104 108 111  CO2 22 21* 22  GLUCOSE 117* 114* 113*  BUN 28* 29* 25*  CREATININE 1.58* 1.36* 1.00  CALCIUM 7.9* 7.6* 7.9*   Liver Function Tests: No results for input(s): AST, ALT, ALKPHOS, BILITOT, PROT, ALBUMIN in the last 8760 hours. No results for input(s): LIPASE, AMYLASE in the last 8760 hours. No results for  input(s): AMMONIA in the last 8760 hours. CBC:  Recent Labs  09/14/15 1421  09/15/15 0643 09/16/15 0357 09/17/15 0424 09/18/15 0351  WBC 14.9*  < > 11.8* 12.0* 12.4* 11.8*  NEUTROABS 12.7*  --  10.3*  --   --   --   HGB 12.8*  < > 11.4* 9.5* 8.7* 8.7*  HCT 36.7*  < > 32.3* 27.7* 25.7* 25.2*  MCV 92.7  < > 94.2 93.3 97.0 96.6  PLT 232  < > 205 177 157 174  < > = values in this interval not displayed.   Radiological Exams: Dg Pelvis Portable  09/15/2015  CLINICAL DATA:  Preop study for closed right hip fracture. Initial encounter. EXAM: PORTABLE PELVIS 1-2 VIEWS COMPARISON:  Radiography and CT from yesterday FINDINGS: Known right femoral neck fracture without signs of interval displacement. Right femoral neck is foreshortened from leg positioning. No evidence of pelvic ring fracture or diastasis. Osteopenia. Lower abdominal hernia repair with mesh. IMPRESSION: 1. Known right femoral neck fracture without evidence of interval displacement. 2. Intact pelvic ring. Electronically Signed   By: Monte Fantasia M.D.   On: 09/15/2015 13:44   Ct Hip Right Wo Contrast  09/14/2015  CLINICAL DATA:  Status post fall from a standing position this morning. Right hip pain and difficulty walking. Initial encounter. EXAM: CT OF THE RIGHT HIP WITHOUT CONTRAST TECHNIQUE: Multidetector CT imaging of the right hip was performed according to the standard protocol. Multiplanar CT image  reconstructions were also generated. COMPARISON:  Plain films the right hip earlier today. FINDINGS: The patient has a mildly impacted subcapital fracture of the right femoral neck. The fracture shows minimal anterior displacement. No other acute bony or joint abnormality is identified. The right femoral head is located. No lytic or sclerotic bony lesion is seen. There is mild degenerative disease about the right hip. Imaged intrapelvic contents demonstrate no acute abnormality. Atherosclerosis is noted. The patient is status post hernia repair. IMPRESSION: Acute, mildly impacted subcapital fracture right hip with minimal anterior displacement. Electronically Signed   By: Inge Rise M.D.   On: 09/14/2015 16:37   Dg C-arm 61-120 Min-no Report  09/15/2015  CLINICAL DATA: surgery C-ARM 61-120 MINUTES Fluoroscopy was utilized by the requesting physician.  No radiographic interpretation.   Dg Hip Operative Unilat With Pelvis Right  09/15/2015  CLINICAL DATA:  Intraoperative imaging for fixation of a subcapital right hip fracture. EXAM: OPERATIVE RIGHT HIP (WITH PELVIS IF PERFORMED) 4 VIEWS TECHNIQUE: Fluoroscopic spot image(s) were submitted for interpretation post-operatively. COMPARISON:  CT right hip 09/14/2015. FINDINGS: Intraoperative imaging demonstrates placement of 3 screws for fixation of a subcapital fracture. No acute abnormalities identified. IMPRESSION: Intraoperative imaging for fixation of a right hip fracture. Electronically Signed   By: Inge Rise M.D.   On: 09/15/2015 20:10   Dg Hip Unilat  With Pelvis 2-3 Views Right  09/14/2015  CLINICAL DATA:  [address] at nursing home today and landed on right side. Right hip pain after fall. EXAM: DG HIP (WITH OR WITHOUT PELVIS) 2-3V RIGHT COMPARISON:  No comparison studies available. FINDINGS: Frontal pelvis AP and frog-leg lateral views of the right hip show no gross fracture or dislocation. Apparent disruption of the compressive trabecula in the  femoral neck raises the question of a nondisplaced fracture although this is not a definite finding and may represent superimposition of osteophytes SI joints and symphysis pubis are unremarkable. Patient has had prior right lower quadrant ventral mesh placement. IMPRESSION: Possible, but not definite nondisplaced  femoral neck fracture. MRI recommended to further evaluate. Electronically Signed   By: Misty Stanley M.D.   On: 09/14/2015 13:04    Assessment/Plan  Generalized weakness Will have patient work with PT/OT as tolerated to regain strength and restore function.  Fall precautions are in place.  Right hip fracture S/p closed reduction. Has orthopedic follow up. Continue aspirin 325 mg bid for a month for dvt prophylaxis. Continue tramadol 50 mg tid prn pain. Will have him work with physical therapy and occupational therapy team to help with gait training and muscle strengthening exercises.fall precautions. Skin care. Encourage to be out of bed. WBAT to RLE  Acute urinary retention Likely post op, foley has been removed and he has been voiding well. Continue flomax  Dementia Without behavioral disturbance. Continue supportive care. Continue aricept. To provide assistance with meals. SLP to evaluate for cognitive impairment  Leg edema Add ted hose and advised to keep legs elevated at rest  Anemia unspecified Continue feso4 supplement  CAD continue metoprolol succinate with valsartan and aspirin and monitor  Chronic depression and anxiety continue sertraline 50 mg daily with xanax current regimen    Goals of care: short term rehabilitation   Labs/tests ordered: cbc, bmp   Family/ staff Communication: reviewed care plan with patient, his wife and nursing supervisor    Blanchie Serve, MD Internal Medicine [facility] [place], [place] [address] Cell Phone (Monday-Friday 8 am - 5 pm): [phone] On Call: [phone] and  follow prompts after 5 pm and on weekends Office Phone: [phone] Office Fax: [phone]

## 2015-09-30 DIAGNOSIS — S72041D Displaced fracture of base of neck of right femur, subsequent encounter for closed fracture with routine healing: Secondary | ICD-10-CM | POA: Diagnosis not present

## 2015-09-30 LAB — BASIC METABOLIC PANEL
BUN: 22 mg/dL — AB (ref 4–21)
CREATININE: 1 mg/dL (ref 0.6–1.3)
Glucose: 88 mg/dL
POTASSIUM: 4.2 mmol/L (ref 3.4–5.3)
SODIUM: 138 mmol/L (ref 137–147)

## 2015-09-30 LAB — CBC AND DIFFERENTIAL
HCT: 24 % — AB (ref 41–53)
HEMOGLOBIN: 7.7 g/dL — AB (ref 13.5–17.5)
Neutrophils Absolute: 5 /uL
Platelets: 327 10*3/uL (ref 150–399)
WBC: 8.7 10*3/mL

## 2015-10-05 ENCOUNTER — Encounter: Payer: Self-pay | Admitting: Adult Health

## 2015-10-05 ENCOUNTER — Non-Acute Institutional Stay (SKILLED_NURSING_FACILITY): Payer: Medicare Other | Admitting: Adult Health

## 2015-10-05 DIAGNOSIS — R400 Somnolence: Secondary | ICD-10-CM | POA: Diagnosis not present

## 2015-10-05 NOTE — Progress Notes (Signed)
Patient ID: Austin Gallagher, male   DOB: 03/30/1926, 82 y.o.   MRN: [id number]    DATE:    10/05/15  MRN:  [id number]  BIRTHDAY: 1925-12-10  Facility:  Nursing Home Location:  [place] Room Number: [id number]  LEVEL OF CARE:  SNF (31)  Contact Information    Name Relation Home Work Mobile   [place] Spouse [phone]  [phone]       Code Status History    Date Active Date Inactive Code Status Order ID Comments User Context   09/14/2015  7:30 PM 09/18/2015  2:34 PM Full Code [id number]  Gennaro Africa, MD ED   02/04/2013  1:43 AM 02/05/2013  5:57 PM Full Code [id number]  Etta Quill, DO ED   06/08/2012  2:49 PM 06/11/2012  5:02 PM DNR [id number]  Elmarie Shiley, MD Inpatient       Chief Complaint  Patient presents with  . Acute Visit    Somnolence   HISTORY OF PRESENT ILLNESS:  This is an 82 year old male who was noted to be sleepy. He is currently on Xanax for anxiety and Zoloft for depression.   He has been admitted to [facility] on 09/18/15 from [facility]. He had a fall while in a restaurant and sustained a right hip fracture for which he had closed reduction and screw fixation on 09/15/15.   He is currently having short-term rehabilitation.  PAST MEDICAL HISTORY:  Past Medical History  Diagnosis Date  . CAD (coronary artery disease)     Stent to RCA in 2005  . Hyperlipidemia   . Essential hypertension, benign   . CORONARY ATHEROSCLEROSIS, NATIVE VESSEL   . Dementia   . Unsteady gait   . Closed fracture of right femur ([place])   . Urinary retention   . Slow transit constipation   . Anxiety   . Acute blood loss anemia   . Vitamin D deficiency   . Unsteady gait      CURRENT MEDICATIONS: Reviewed  Patient's Medications  New Prescriptions   No medications on file  Previous Medications   ACETAMINOPHEN (TYLENOL) 500 MG TABLET    Take 500 mg by mouth every 6 (six) hours as needed for moderate pain or fever.   ALPRAZOLAM (XANAX XR) 0.5 MG 24 HR TABLET    Take 0.5 mg by mouth daily. Take at 7PM   ALPRAZOLAM (XANAX) 0.25 MG TABLET    Take 0.25 mg by mouth every evening. 0.25 mg tab at 6AM and 1 qd PRN   ALUM & MAG HYDROXIDE-SIMETH (MAALOX/MYLANTA) 200-200-20 MG/5ML SUSPENSION    Take 30 mLs by mouth every 4 (four) hours as needed for indigestion.   ASPIRIN EC 325 MG TABLET    Take 325 mg by mouth 2 (two) times daily.   BISACODYL (DULCOLAX) 10 MG SUPPOSITORY    Place 1 suppository (10 mg total) rectally daily as needed for moderate constipation.   CHOLECALCIFEROL (VITAMIN D3) 2000 UNITS CAPSULE    Take 2,000 Units by mouth daily.   DONEPEZIL (ARICEPT) 10 MG TABLET    Take 10 mg by mouth daily.    FERROUS SULFATE 325 (65 FE) MG TABLET    Take 1 tablet (325 mg total) by mouth 3 (three) times daily after meals.   MAGNESIUM CITRATE SOLN    Take 296 mLs (1 Bottle total) by mouth once as needed for severe constipation.   METOPROLOL SUCCINATE (TOPROL-XL) 25 MG 24 HR TABLET  Take 12.5 mg by mouth every morning.   POLYETHYLENE GLYCOL (MIRALAX / GLYCOLAX) PACKET    Take 17 g by mouth 2 (two) times daily.   SENNOSIDES-DOCUSATE SODIUM (SENOKOT-S) 8.6-50 MG TABLET    Take 2 tablets by mouth 2 (two) times daily.   SERTRALINE (ZOLOFT) 50 MG TABLET    Take 50 mg by mouth daily. QAM   TAMSULOSIN (FLOMAX) 0.4 MG CAPS CAPSULE    Take 1 capsule (0.4 mg total) by mouth daily.   UNABLE TO FIND    Med Name: MedPass 120 mL po BID between meals for supplement   VALSARTAN (DIOVAN) 320 MG TABLET    Take 1 tablet (320 mg total) by mouth daily.  Modified Medications   No medications on file  Discontinued Medications   DOCUSATE SODIUM (COLACE) 100 MG CAPSULE    Take 1 capsule (100 mg total) by mouth 2 (two) times daily.   POLYETHYLENE GLYCOL (MIRALAX / GLYCOLAX) PACKET    Take 17 g by mouth daily as needed for mild constipation.   TRAMADOL (ULTRAM) 50 MG TABLET    Take 1 tablet (50 mg total) by mouth every 3 (three) hours as needed  for severe pain.     Allergies  Allergen Reactions  . Ace Inhibitors     Documented on MAR  . Amlodipine Other (See Comments)    REACTION: ankles itching     REVIEW OF SYSTEMS:  Unable to obtain due to dementia   PHYSICAL EXAMINATION  GENERAL APPEARANCE: Well nourished. In no acute distress. Normal body habitus SKIN:  Right hip surgical incision is covered with aquacel dressing, dry, no erythema  HEAD: Normal in size and contour. No evidence of trauma EYES: Lids open and close normally. No blepharitis, entropion or ectropion. PERRL. Conjunctivae are clear and sclerae are white. Lenses are without opacity EARS: Pinnae are normal. Patient hears normal voice tunes of the examiner MOUTH and THROAT: Lips are without lesions. Oral mucosa is moist and without lesions. Tongue is normal in shape, size, and color and without lesions NECK: supple, trachea midline, no neck masses, no thyroid tenderness, no thyromegaly LYMPHATICS: no LAN in the neck, no supraclavicular LAN RESPIRATORY: breathing is even & unlabored, BS CTAB CARDIAC: RRR, no murmur,no extra heart sounds, no edema GI: abdomen soft, normal BS, no masses, no tenderness, no hepatomegaly, no splenomegaly GU:   Has foley catheter draining to urine bag with clear yellowish urine EXTREMITIES:  Able to move X 4 extremities  NEURO:  Somnolent PSYCHIATRIC: Alert to person, disoriented to time and place. Affect and behavior are appropriate  LABS/RADIOLOGY: Labs reviewed: Basic Metabolic Panel:  Recent Labs  09/16/15 0357 09/17/15 0424 09/18/15 0351 09/22/15 09/30/15  NA 133* 134* 138 141 138  K 3.5 3.7 4.0 4.1 4.2  CL 104 108 111  --   --   CO2 22 21* 22  --   --   GLUCOSE 117* 114* 113*  --   --   BUN 28* 29* 25* 26* 22*  CREATININE 1.58* 1.36* 1.00 1.0 1.0  CALCIUM 7.9* 7.6* 7.9*  --   --    CBC:  Recent Labs  09/15/15 0643 09/16/15 0357 09/17/15 0424 09/18/15 0351 09/22/15 09/30/15 10/09/15  WBC 11.8* 12.0* 12.4*  11.8* 13.7 8.7 6.7  NEUTROABS 10.3*  --   --   --  10 5  --   HGB 11.4* 9.5* 8.7* 8.7* 8.9* 7.7* 8.6*  HCT 32.3* 27.7* 25.7* 25.2* 27* 24* 27*  MCV  94.2 93.3 97.0 96.6  --   --   --   PLT 205 177 157 174 335 327 296      ASSESSMENT/PLAN:  Somnolence - continue Xanax 0.5 mg PO Q AM and 0.25 mg Q 7 PM, decrease Xanax 0.25 mg from BID PRN to Q D PRN and decrease Zoloft from 75 mg to 50 mg daily     Durenda Age, NP [employer] [phone]

## 2015-10-09 LAB — CBC AND DIFFERENTIAL
HEMATOCRIT: 27 % — AB (ref 41–53)
HEMOGLOBIN: 8.6 g/dL — AB (ref 13.5–17.5)
Platelets: 296 10*3/uL (ref 150–399)
WBC: 6.7 10^3/mL

## 2015-10-19 ENCOUNTER — Encounter: Payer: Self-pay | Admitting: Adult Health

## 2015-10-19 ENCOUNTER — Non-Acute Institutional Stay (SKILLED_NURSING_FACILITY): Payer: Medicare Other | Admitting: Adult Health

## 2015-10-19 DIAGNOSIS — F0280 Dementia in other diseases classified elsewhere without behavioral disturbance: Secondary | ICD-10-CM

## 2015-10-19 DIAGNOSIS — K5901 Slow transit constipation: Secondary | ICD-10-CM | POA: Diagnosis not present

## 2015-10-19 DIAGNOSIS — I2510 Atherosclerotic heart disease of native coronary artery without angina pectoris: Secondary | ICD-10-CM

## 2015-10-19 DIAGNOSIS — R2681 Unsteadiness on feet: Secondary | ICD-10-CM | POA: Diagnosis not present

## 2015-10-19 DIAGNOSIS — E559 Vitamin D deficiency, unspecified: Secondary | ICD-10-CM

## 2015-10-19 DIAGNOSIS — F419 Anxiety disorder, unspecified: Secondary | ICD-10-CM

## 2015-10-19 DIAGNOSIS — D62 Acute posthemorrhagic anemia: Secondary | ICD-10-CM | POA: Diagnosis not present

## 2015-10-19 DIAGNOSIS — S7291XS Unspecified fracture of right femur, sequela: Secondary | ICD-10-CM | POA: Diagnosis not present

## 2015-10-19 DIAGNOSIS — G3183 Dementia with Lewy bodies: Secondary | ICD-10-CM

## 2015-10-19 DIAGNOSIS — R339 Retention of urine, unspecified: Secondary | ICD-10-CM

## 2015-10-19 DIAGNOSIS — I10 Essential (primary) hypertension: Secondary | ICD-10-CM | POA: Diagnosis not present

## 2015-10-19 DIAGNOSIS — F32A Depression, unspecified: Secondary | ICD-10-CM

## 2015-10-19 DIAGNOSIS — F329 Major depressive disorder, single episode, unspecified: Secondary | ICD-10-CM

## 2015-10-19 NOTE — Progress Notes (Signed)
Patient ID: Austin Gallagher, male   DOB: Jun 29, 1925, 82 y.o.   MRN: [id number]    DATE:  10/19/15  MRN:  [id number]  BIRTHDAY: Feb 21, 1926  Facility:  Nursing Home Location:  [place] Room Number: [id number]  LEVEL OF CARE:  SNF (31)  Contact Information    Name Relation Home Work Mobile   [place] Spouse [phone]  [phone]       Code Status History    Date Active Date Inactive Code Status Order ID Comments User Context   09/14/2015  7:30 PM 09/18/2015  2:34 PM Full Code [id number]  Gennaro Africa, MD ED   02/04/2013  1:43 AM 02/05/2013  5:57 PM Full Code [id number]  Etta Quill, DO ED   06/08/2012  2:49 PM 06/11/2012  5:02 PM DNR [id number]  Elmarie Shiley, MD Inpatient       Chief Complaint  Patient presents with  . Discharge Note    HISTORY OF PRESENT ILLNESS:  This is an 82 year old male who is for discharge home with Home health PT, OT, CNA, ST and Nursing. DME:  Bedside commode, standard wheelchair, cushion, anti-tippers, brake extensions and elevating leg rests.  He has been admitted to [facility] on 09/18/15 from [facility]. He had a fall while in a restaurant and sustained a right hip fracture for which he had closed reduction and screw fixation on 09/15/15.   Patient was admitted to this facility for short-term rehabilitation after the patient's recent hospitalization.  Patient has completed SNF rehabilitation and therapy has cleared the patient for discharge.   PAST MEDICAL HISTORY:  Past Medical History  Diagnosis Date  . CAD (coronary artery disease)     Stent to RCA in 2005  . Hyperlipidemia   . Essential hypertension, benign   . CORONARY ATHEROSCLEROSIS, NATIVE VESSEL   . Dementia   . Unsteady gait   . Closed fracture of right femur ([place])   . Urinary retention   . Slow transit constipation   . Anxiety   . Acute blood loss anemia   . Vitamin D deficiency   . Unsteady gait      CURRENT  MEDICATIONS: Reviewed  Patient's Medications  New Prescriptions   No medications on file  Previous Medications   ACETAMINOPHEN (TYLENOL) 500 MG TABLET    Take 500 mg by mouth every 6 (six) hours as needed for moderate pain or fever.   ALPRAZOLAM (XANAX XR) 0.5 MG 24 HR TABLET    Take 0.5 mg by mouth daily. Take at 7PM   ALPRAZOLAM (XANAX) 0.25 MG TABLET    Take 0.25 mg by mouth every evening. 0.25 mg tab at 6AM and 1 qd PRN   ALUM & MAG HYDROXIDE-SIMETH (MAALOX/MYLANTA) 200-200-20 MG/5ML SUSPENSION    Take 30 mLs by mouth every 4 (four) hours as needed for indigestion.   ASPIRIN EC 325 MG TABLET    Take 325 mg by mouth 2 (two) times daily.   BISACODYL (DULCOLAX) 10 MG SUPPOSITORY    Place 1 suppository (10 mg total) rectally daily as needed for moderate constipation.   CHOLECALCIFEROL (VITAMIN D3) 2000 UNITS CAPSULE    Take 2,000 Units by mouth daily.   DONEPEZIL (ARICEPT) 10 MG TABLET    Take 10 mg by mouth daily.    FERROUS SULFATE 325 (65 FE) MG TABLET    Take 1 tablet (325 mg total) by mouth 3 (three) times daily after meals.   MAGNESIUM CITRATE  SOLN    Take 296 mLs (1 Bottle total) by mouth once as needed for severe constipation.   METOPROLOL SUCCINATE (TOPROL-XL) 25 MG 24 HR TABLET    Take 12.5 mg by mouth every morning.   POLYETHYLENE GLYCOL (MIRALAX / GLYCOLAX) PACKET    Take 17 g by mouth 2 (two) times daily.   SENNOSIDES-DOCUSATE SODIUM (SENOKOT-S) 8.6-50 MG TABLET    Take 2 tablets by mouth 2 (two) times daily.   SERTRALINE (ZOLOFT) 50 MG TABLET    Take 50 mg by mouth daily. QAM   TAMSULOSIN (FLOMAX) 0.4 MG CAPS CAPSULE    Take 1 capsule (0.4 mg total) by mouth daily.   UNABLE TO FIND    Med Name: MedPass 120 mL po BID between meals for supplement   VALSARTAN (DIOVAN) 320 MG TABLET    Take 1 tablet (320 mg total) by mouth daily.  Modified Medications   No medications on file  Discontinued Medications   No medications on file     Allergies  Allergen Reactions  . Ace  Inhibitors     Documented on MAR  . Amlodipine Other (See Comments)    REACTION: ankles itching     REVIEW OF SYSTEMS:  Unable to obtain due to dementia   PHYSICAL EXAMINATION  GENERAL APPEARANCE: Well nourished. In no acute distress. Normal body habitus SKIN:  Right hip surgical incision is  dry, no erythema  HEAD: Normal in size and contour. No evidence of trauma EYES: Lids open and close normally. No blepharitis, entropion or ectropion. PERRL. Conjunctivae are clear and sclerae are white. Lenses are without opacity EARS: Pinnae are normal. Patient hears normal voice tunes of the examiner MOUTH and THROAT: Lips are without lesions. Oral mucosa is moist and without lesions. Tongue is normal in shape, size, and color and without lesions NECK: supple, trachea midline, no neck masses, no thyroid tenderness, no thyromegaly LYMPHATICS: no LAN in the neck, no supraclavicular LAN RESPIRATORY: breathing is even & unlabored, BS CTAB CARDIAC: RRR, no murmur,no extra heart sounds, no edema GI: abdomen soft, normal BS, no masses, no tenderness, no hepatomegaly, no splenomegaly GU:   Has foley catheter draining to urine bag with clear yellowish urine EXTREMITIES:  Able to move X 4 extremities  PSYCHIATRIC: Alert to person, disoriented to time and place. Affect and behavior are appropriate  LABS/RADIOLOGY: Labs reviewed: Basic Metabolic Panel:  Recent Labs  09/16/15 0357 09/17/15 0424 09/18/15 0351 09/22/15 09/30/15  NA 133* 134* 138 141 138  K 3.5 3.7 4.0 4.1 4.2  CL 104 108 111  --   --   CO2 22 21* 22  --   --   GLUCOSE 117* 114* 113*  --   --   BUN 28* 29* 25* 26* 22*  CREATININE 1.58* 1.36* 1.00 1.0 1.0  CALCIUM 7.9* 7.6* 7.9*  --   --    CBC:  Recent Labs  09/15/15 0643 09/16/15 0357 09/17/15 0424 09/18/15 0351 09/22/15 09/30/15 10/09/15  WBC 11.8* 12.0* 12.4* 11.8* 13.7 8.7 6.7  NEUTROABS 10.3*  --   --   --  10 5  --   HGB 11.4* 9.5* 8.7* 8.7* 8.9* 7.7* 8.6*  HCT  32.3* 27.7* 25.7* 25.2* 27* 24* 27*  MCV 94.2 93.3 97.0 96.6  --   --   --   PLT 205 177 157 174 335 327 296       ASSESSMENT/PLAN:  Unsteady gait - for Home health PT, OT, Nursing, CNA and  ST; fall precaution  Closed right hip fracture S/P closed reduction and screw fixation - for Home health PT, OT, Nursing ST and CNA; continue aspirin 325 mg 1 tab by mouth twice a day till 10/21/15 for DVT prophylaxis;  acetaminophen 500 mg 1 tab by mouth every 6 hours when necessary for pain; follow-up with orthopedic surgeon   Urinary retention - continue Flomax 0.4 mg 1 capsule by mouth daily  CAD - continue aspirin 325 mg twice a day for a total of 4 weeks and metoprolol succinate ER 25 mg 1/2 tab = 12.5 mg by mouth daily  Dementia - continue Aricept 10 mg 1 tab by mouth daily  Depression - mood is stable; recently decreased sertraline to  50 mg 1 tab by mouth daily  Hypertension - continue metoprolol succinate 25 mg 24 hour 1/2 tab - 12.5 mg by mouth every morning and Valsartan 320 mg 1 tab by mouth daily  Constipation - continue MiraLAX to 17 g by mouth BID, discontinue Colace, start senna S 2 tabs by mouth twice a day, Dulcolax suppository 10 mg 1 per rectum daily when necessary and magnesian citrate 296 mL by mouth daily when necessary  Anxiety - continue Xanax 0.5 mg 1 tab by mouth every AM and 0.25 mg by mouth 1 tab PO Q 7PM and 0.25 mg 1 tab PO Q D PRN  Anemia, acute blood loss - continue for sulfate 325 mg 1 tab by mouth 3 times a day Lab Results  Component Value Date   WBC 6.7 10/09/2015   HGB 8.6* 10/09/2015   HCT 27* 10/09/2015   MCV 96.6 09/18/2015   PLT 296 10/09/2015   Vitamin D deficiency -  continue vitamin D3  2000 units 1 tab by mouth daily       I have filled out patient's discharge paperwork and written prescriptions.  Patient will receive home health PT, OT, ST, Nursing and CNA.  DME provided:  Bedside commode, standard wheelchair, cushion, anti-tippers, brake  extensions and elevating leg rests  Total discharge time: Greater than 30 minutes  Discharge time involved coordination of the discharge process with social worker, nursing staff and therapy department. Medical justification for home health services/DME verified.     Durenda Age, NP [employer] [phone]

## 2015-10-20 DIAGNOSIS — F419 Anxiety disorder, unspecified: Secondary | ICD-10-CM | POA: Diagnosis not present

## 2015-10-20 DIAGNOSIS — F329 Major depressive disorder, single episode, unspecified: Secondary | ICD-10-CM | POA: Diagnosis not present

## 2015-10-20 DIAGNOSIS — I10 Essential (primary) hypertension: Secondary | ICD-10-CM | POA: Diagnosis not present

## 2015-10-20 DIAGNOSIS — I2510 Atherosclerotic heart disease of native coronary artery without angina pectoris: Secondary | ICD-10-CM | POA: Diagnosis not present

## 2015-10-20 DIAGNOSIS — S72001D Fracture of unspecified part of neck of right femur, subsequent encounter for closed fracture with routine healing: Secondary | ICD-10-CM | POA: Diagnosis not present

## 2015-10-20 DIAGNOSIS — F0390 Unspecified dementia without behavioral disturbance: Secondary | ICD-10-CM | POA: Diagnosis not present

## 2015-10-21 DIAGNOSIS — I10 Essential (primary) hypertension: Secondary | ICD-10-CM | POA: Diagnosis not present

## 2015-10-21 DIAGNOSIS — S72001D Fracture of unspecified part of neck of right femur, subsequent encounter for closed fracture with routine healing: Secondary | ICD-10-CM | POA: Diagnosis not present

## 2015-10-21 DIAGNOSIS — F419 Anxiety disorder, unspecified: Secondary | ICD-10-CM | POA: Diagnosis not present

## 2015-10-21 DIAGNOSIS — F329 Major depressive disorder, single episode, unspecified: Secondary | ICD-10-CM | POA: Diagnosis not present

## 2015-10-21 DIAGNOSIS — F0390 Unspecified dementia without behavioral disturbance: Secondary | ICD-10-CM | POA: Diagnosis not present

## 2015-10-21 DIAGNOSIS — I2510 Atherosclerotic heart disease of native coronary artery without angina pectoris: Secondary | ICD-10-CM | POA: Diagnosis not present

## 2015-10-22 DIAGNOSIS — F0390 Unspecified dementia without behavioral disturbance: Secondary | ICD-10-CM | POA: Diagnosis not present

## 2015-10-22 DIAGNOSIS — I2510 Atherosclerotic heart disease of native coronary artery without angina pectoris: Secondary | ICD-10-CM | POA: Diagnosis not present

## 2015-10-22 DIAGNOSIS — F329 Major depressive disorder, single episode, unspecified: Secondary | ICD-10-CM | POA: Diagnosis not present

## 2015-10-22 DIAGNOSIS — S72001D Fracture of unspecified part of neck of right femur, subsequent encounter for closed fracture with routine healing: Secondary | ICD-10-CM | POA: Diagnosis not present

## 2015-10-22 DIAGNOSIS — F419 Anxiety disorder, unspecified: Secondary | ICD-10-CM | POA: Diagnosis not present

## 2015-10-22 DIAGNOSIS — I10 Essential (primary) hypertension: Secondary | ICD-10-CM | POA: Diagnosis not present

## 2015-10-23 DIAGNOSIS — G301 Alzheimer's disease with late onset: Secondary | ICD-10-CM | POA: Diagnosis not present

## 2015-10-23 DIAGNOSIS — F0390 Unspecified dementia without behavioral disturbance: Secondary | ICD-10-CM | POA: Diagnosis not present

## 2015-10-23 DIAGNOSIS — M810 Age-related osteoporosis without current pathological fracture: Secondary | ICD-10-CM | POA: Diagnosis not present

## 2015-10-23 DIAGNOSIS — R2681 Unsteadiness on feet: Secondary | ICD-10-CM | POA: Diagnosis not present

## 2015-10-23 DIAGNOSIS — S72001D Fracture of unspecified part of neck of right femur, subsequent encounter for closed fracture with routine healing: Secondary | ICD-10-CM | POA: Diagnosis not present

## 2015-10-23 DIAGNOSIS — I2510 Atherosclerotic heart disease of native coronary artery without angina pectoris: Secondary | ICD-10-CM | POA: Diagnosis not present

## 2015-10-23 DIAGNOSIS — I10 Essential (primary) hypertension: Secondary | ICD-10-CM | POA: Diagnosis not present

## 2015-10-23 DIAGNOSIS — F419 Anxiety disorder, unspecified: Secondary | ICD-10-CM | POA: Diagnosis not present

## 2015-10-23 DIAGNOSIS — F329 Major depressive disorder, single episode, unspecified: Secondary | ICD-10-CM | POA: Diagnosis not present

## 2015-10-26 DIAGNOSIS — M1611 Unilateral primary osteoarthritis, right hip: Secondary | ICD-10-CM | POA: Diagnosis not present

## 2015-10-27 DIAGNOSIS — F419 Anxiety disorder, unspecified: Secondary | ICD-10-CM | POA: Diagnosis not present

## 2015-10-27 DIAGNOSIS — S72001D Fracture of unspecified part of neck of right femur, subsequent encounter for closed fracture with routine healing: Secondary | ICD-10-CM | POA: Diagnosis not present

## 2015-10-27 DIAGNOSIS — I2510 Atherosclerotic heart disease of native coronary artery without angina pectoris: Secondary | ICD-10-CM | POA: Diagnosis not present

## 2015-10-27 DIAGNOSIS — F0390 Unspecified dementia without behavioral disturbance: Secondary | ICD-10-CM | POA: Diagnosis not present

## 2015-10-27 DIAGNOSIS — F329 Major depressive disorder, single episode, unspecified: Secondary | ICD-10-CM | POA: Diagnosis not present

## 2015-10-27 DIAGNOSIS — I10 Essential (primary) hypertension: Secondary | ICD-10-CM | POA: Diagnosis not present

## 2015-10-28 DIAGNOSIS — I2510 Atherosclerotic heart disease of native coronary artery without angina pectoris: Secondary | ICD-10-CM | POA: Diagnosis not present

## 2015-10-28 DIAGNOSIS — I10 Essential (primary) hypertension: Secondary | ICD-10-CM | POA: Diagnosis not present

## 2015-10-28 DIAGNOSIS — S72001D Fracture of unspecified part of neck of right femur, subsequent encounter for closed fracture with routine healing: Secondary | ICD-10-CM | POA: Diagnosis not present

## 2015-10-28 DIAGNOSIS — F0390 Unspecified dementia without behavioral disturbance: Secondary | ICD-10-CM | POA: Diagnosis not present

## 2015-10-28 DIAGNOSIS — F419 Anxiety disorder, unspecified: Secondary | ICD-10-CM | POA: Diagnosis not present

## 2015-10-28 DIAGNOSIS — F329 Major depressive disorder, single episode, unspecified: Secondary | ICD-10-CM | POA: Diagnosis not present

## 2015-10-30 DIAGNOSIS — I10 Essential (primary) hypertension: Secondary | ICD-10-CM | POA: Diagnosis not present

## 2015-10-30 DIAGNOSIS — S72001D Fracture of unspecified part of neck of right femur, subsequent encounter for closed fracture with routine healing: Secondary | ICD-10-CM | POA: Diagnosis not present

## 2015-10-30 DIAGNOSIS — F329 Major depressive disorder, single episode, unspecified: Secondary | ICD-10-CM | POA: Diagnosis not present

## 2015-10-30 DIAGNOSIS — F0390 Unspecified dementia without behavioral disturbance: Secondary | ICD-10-CM | POA: Diagnosis not present

## 2015-10-30 DIAGNOSIS — I2510 Atherosclerotic heart disease of native coronary artery without angina pectoris: Secondary | ICD-10-CM | POA: Diagnosis not present

## 2015-10-30 DIAGNOSIS — F419 Anxiety disorder, unspecified: Secondary | ICD-10-CM | POA: Diagnosis not present

## 2015-11-02 DIAGNOSIS — I2510 Atherosclerotic heart disease of native coronary artery without angina pectoris: Secondary | ICD-10-CM | POA: Diagnosis not present

## 2015-11-02 DIAGNOSIS — F0390 Unspecified dementia without behavioral disturbance: Secondary | ICD-10-CM | POA: Diagnosis not present

## 2015-11-02 DIAGNOSIS — S72001D Fracture of unspecified part of neck of right femur, subsequent encounter for closed fracture with routine healing: Secondary | ICD-10-CM | POA: Diagnosis not present

## 2015-11-02 DIAGNOSIS — F419 Anxiety disorder, unspecified: Secondary | ICD-10-CM | POA: Diagnosis not present

## 2015-11-02 DIAGNOSIS — I10 Essential (primary) hypertension: Secondary | ICD-10-CM | POA: Diagnosis not present

## 2015-11-02 DIAGNOSIS — F329 Major depressive disorder, single episode, unspecified: Secondary | ICD-10-CM | POA: Diagnosis not present

## 2015-11-03 DIAGNOSIS — F419 Anxiety disorder, unspecified: Secondary | ICD-10-CM | POA: Diagnosis not present

## 2015-11-03 DIAGNOSIS — F329 Major depressive disorder, single episode, unspecified: Secondary | ICD-10-CM | POA: Diagnosis not present

## 2015-11-03 DIAGNOSIS — I2510 Atherosclerotic heart disease of native coronary artery without angina pectoris: Secondary | ICD-10-CM | POA: Diagnosis not present

## 2015-11-03 DIAGNOSIS — F0390 Unspecified dementia without behavioral disturbance: Secondary | ICD-10-CM | POA: Diagnosis not present

## 2015-11-03 DIAGNOSIS — I10 Essential (primary) hypertension: Secondary | ICD-10-CM | POA: Diagnosis not present

## 2015-11-03 DIAGNOSIS — S72001D Fracture of unspecified part of neck of right femur, subsequent encounter for closed fracture with routine healing: Secondary | ICD-10-CM | POA: Diagnosis not present

## 2015-11-06 ENCOUNTER — Encounter: Payer: Self-pay | Admitting: Internal Medicine

## 2015-11-06 DIAGNOSIS — Z4789 Encounter for other orthopedic aftercare: Secondary | ICD-10-CM | POA: Diagnosis not present

## 2015-11-06 DIAGNOSIS — S72001D Fracture of unspecified part of neck of right femur, subsequent encounter for closed fracture with routine healing: Secondary | ICD-10-CM | POA: Diagnosis not present

## 2015-11-06 NOTE — Progress Notes (Signed)
This encounter was created in error - please disregard.

## 2015-11-09 DIAGNOSIS — Z9181 History of falling: Secondary | ICD-10-CM | POA: Diagnosis not present

## 2015-11-09 DIAGNOSIS — I10 Essential (primary) hypertension: Secondary | ICD-10-CM | POA: Diagnosis not present

## 2015-11-09 DIAGNOSIS — F0391 Unspecified dementia with behavioral disturbance: Secondary | ICD-10-CM | POA: Diagnosis not present

## 2015-11-09 DIAGNOSIS — S72001D Fracture of unspecified part of neck of right femur, subsequent encounter for closed fracture with routine healing: Secondary | ICD-10-CM | POA: Diagnosis not present

## 2015-11-09 DIAGNOSIS — F419 Anxiety disorder, unspecified: Secondary | ICD-10-CM | POA: Diagnosis not present

## 2015-11-09 DIAGNOSIS — R2689 Other abnormalities of gait and mobility: Secondary | ICD-10-CM | POA: Diagnosis not present

## 2015-11-11 DIAGNOSIS — F0391 Unspecified dementia with behavioral disturbance: Secondary | ICD-10-CM | POA: Diagnosis not present

## 2015-11-11 DIAGNOSIS — R2689 Other abnormalities of gait and mobility: Secondary | ICD-10-CM | POA: Diagnosis not present

## 2015-11-11 DIAGNOSIS — I10 Essential (primary) hypertension: Secondary | ICD-10-CM | POA: Diagnosis not present

## 2015-11-11 DIAGNOSIS — Z9181 History of falling: Secondary | ICD-10-CM | POA: Diagnosis not present

## 2015-11-11 DIAGNOSIS — S72001D Fracture of unspecified part of neck of right femur, subsequent encounter for closed fracture with routine healing: Secondary | ICD-10-CM | POA: Diagnosis not present

## 2015-11-12 DIAGNOSIS — I10 Essential (primary) hypertension: Secondary | ICD-10-CM | POA: Diagnosis not present

## 2015-11-12 DIAGNOSIS — F0391 Unspecified dementia with behavioral disturbance: Secondary | ICD-10-CM | POA: Diagnosis not present

## 2015-11-12 DIAGNOSIS — R2689 Other abnormalities of gait and mobility: Secondary | ICD-10-CM | POA: Diagnosis not present

## 2015-11-12 DIAGNOSIS — Z9181 History of falling: Secondary | ICD-10-CM | POA: Diagnosis not present

## 2015-11-12 DIAGNOSIS — S72001D Fracture of unspecified part of neck of right femur, subsequent encounter for closed fracture with routine healing: Secondary | ICD-10-CM | POA: Diagnosis not present

## 2015-11-13 DIAGNOSIS — R2689 Other abnormalities of gait and mobility: Secondary | ICD-10-CM | POA: Diagnosis not present

## 2015-11-13 DIAGNOSIS — F0391 Unspecified dementia with behavioral disturbance: Secondary | ICD-10-CM | POA: Diagnosis not present

## 2015-11-13 DIAGNOSIS — S72001D Fracture of unspecified part of neck of right femur, subsequent encounter for closed fracture with routine healing: Secondary | ICD-10-CM | POA: Diagnosis not present

## 2015-11-13 DIAGNOSIS — Z9181 History of falling: Secondary | ICD-10-CM | POA: Diagnosis not present

## 2015-11-13 DIAGNOSIS — I10 Essential (primary) hypertension: Secondary | ICD-10-CM | POA: Diagnosis not present

## 2015-11-16 DIAGNOSIS — S72001D Fracture of unspecified part of neck of right femur, subsequent encounter for closed fracture with routine healing: Secondary | ICD-10-CM | POA: Diagnosis not present

## 2015-11-16 DIAGNOSIS — R2689 Other abnormalities of gait and mobility: Secondary | ICD-10-CM | POA: Diagnosis not present

## 2015-11-16 DIAGNOSIS — Z9181 History of falling: Secondary | ICD-10-CM | POA: Diagnosis not present

## 2015-11-16 DIAGNOSIS — F0391 Unspecified dementia with behavioral disturbance: Secondary | ICD-10-CM | POA: Diagnosis not present

## 2015-11-16 DIAGNOSIS — I10 Essential (primary) hypertension: Secondary | ICD-10-CM | POA: Diagnosis not present

## 2015-11-19 DIAGNOSIS — S72001D Fracture of unspecified part of neck of right femur, subsequent encounter for closed fracture with routine healing: Secondary | ICD-10-CM | POA: Diagnosis not present

## 2015-11-19 DIAGNOSIS — I10 Essential (primary) hypertension: Secondary | ICD-10-CM | POA: Diagnosis not present

## 2015-11-19 DIAGNOSIS — Z9181 History of falling: Secondary | ICD-10-CM | POA: Diagnosis not present

## 2015-11-19 DIAGNOSIS — F0391 Unspecified dementia with behavioral disturbance: Secondary | ICD-10-CM | POA: Diagnosis not present

## 2015-11-19 DIAGNOSIS — R2689 Other abnormalities of gait and mobility: Secondary | ICD-10-CM | POA: Diagnosis not present

## 2015-11-23 DIAGNOSIS — S72001D Fracture of unspecified part of neck of right femur, subsequent encounter for closed fracture with routine healing: Secondary | ICD-10-CM | POA: Diagnosis not present

## 2015-11-23 DIAGNOSIS — R2689 Other abnormalities of gait and mobility: Secondary | ICD-10-CM | POA: Diagnosis not present

## 2015-11-23 DIAGNOSIS — Z9181 History of falling: Secondary | ICD-10-CM | POA: Diagnosis not present

## 2015-11-23 DIAGNOSIS — I10 Essential (primary) hypertension: Secondary | ICD-10-CM | POA: Diagnosis not present

## 2015-11-23 DIAGNOSIS — F0391 Unspecified dementia with behavioral disturbance: Secondary | ICD-10-CM | POA: Diagnosis not present

## 2015-11-26 DIAGNOSIS — R2689 Other abnormalities of gait and mobility: Secondary | ICD-10-CM | POA: Diagnosis not present

## 2015-11-26 DIAGNOSIS — Z9181 History of falling: Secondary | ICD-10-CM | POA: Diagnosis not present

## 2015-11-26 DIAGNOSIS — I10 Essential (primary) hypertension: Secondary | ICD-10-CM | POA: Diagnosis not present

## 2015-11-26 DIAGNOSIS — S72001D Fracture of unspecified part of neck of right femur, subsequent encounter for closed fracture with routine healing: Secondary | ICD-10-CM | POA: Diagnosis not present

## 2015-11-26 DIAGNOSIS — F0391 Unspecified dementia with behavioral disturbance: Secondary | ICD-10-CM | POA: Diagnosis not present

## 2015-11-30 DIAGNOSIS — S72001D Fracture of unspecified part of neck of right femur, subsequent encounter for closed fracture with routine healing: Secondary | ICD-10-CM | POA: Diagnosis not present

## 2015-11-30 DIAGNOSIS — F0391 Unspecified dementia with behavioral disturbance: Secondary | ICD-10-CM | POA: Diagnosis not present

## 2015-11-30 DIAGNOSIS — R2689 Other abnormalities of gait and mobility: Secondary | ICD-10-CM | POA: Diagnosis not present

## 2015-11-30 DIAGNOSIS — Z0000 Encounter for general adult medical examination without abnormal findings: Secondary | ICD-10-CM | POA: Diagnosis not present

## 2015-11-30 DIAGNOSIS — R269 Unspecified abnormalities of gait and mobility: Secondary | ICD-10-CM | POA: Diagnosis not present

## 2015-11-30 DIAGNOSIS — Z9181 History of falling: Secondary | ICD-10-CM | POA: Diagnosis not present

## 2015-11-30 DIAGNOSIS — I10 Essential (primary) hypertension: Secondary | ICD-10-CM | POA: Diagnosis not present

## 2015-11-30 DIAGNOSIS — F419 Anxiety disorder, unspecified: Secondary | ICD-10-CM | POA: Diagnosis not present

## 2015-12-02 DIAGNOSIS — S72001D Fracture of unspecified part of neck of right femur, subsequent encounter for closed fracture with routine healing: Secondary | ICD-10-CM | POA: Diagnosis not present

## 2015-12-02 DIAGNOSIS — F0391 Unspecified dementia with behavioral disturbance: Secondary | ICD-10-CM | POA: Diagnosis not present

## 2015-12-02 DIAGNOSIS — Z9181 History of falling: Secondary | ICD-10-CM | POA: Diagnosis not present

## 2015-12-02 DIAGNOSIS — R2689 Other abnormalities of gait and mobility: Secondary | ICD-10-CM | POA: Diagnosis not present

## 2015-12-02 DIAGNOSIS — I10 Essential (primary) hypertension: Secondary | ICD-10-CM | POA: Diagnosis not present

## 2015-12-08 DIAGNOSIS — Z9181 History of falling: Secondary | ICD-10-CM | POA: Diagnosis not present

## 2015-12-08 DIAGNOSIS — I10 Essential (primary) hypertension: Secondary | ICD-10-CM | POA: Diagnosis not present

## 2015-12-08 DIAGNOSIS — S72001D Fracture of unspecified part of neck of right femur, subsequent encounter for closed fracture with routine healing: Secondary | ICD-10-CM | POA: Diagnosis not present

## 2015-12-08 DIAGNOSIS — F0391 Unspecified dementia with behavioral disturbance: Secondary | ICD-10-CM | POA: Diagnosis not present

## 2015-12-08 DIAGNOSIS — R2689 Other abnormalities of gait and mobility: Secondary | ICD-10-CM | POA: Diagnosis not present

## 2015-12-10 DIAGNOSIS — F0391 Unspecified dementia with behavioral disturbance: Secondary | ICD-10-CM | POA: Diagnosis not present

## 2015-12-10 DIAGNOSIS — S72001D Fracture of unspecified part of neck of right femur, subsequent encounter for closed fracture with routine healing: Secondary | ICD-10-CM | POA: Diagnosis not present

## 2015-12-10 DIAGNOSIS — Z9181 History of falling: Secondary | ICD-10-CM | POA: Diagnosis not present

## 2015-12-10 DIAGNOSIS — R2689 Other abnormalities of gait and mobility: Secondary | ICD-10-CM | POA: Diagnosis not present

## 2015-12-10 DIAGNOSIS — I10 Essential (primary) hypertension: Secondary | ICD-10-CM | POA: Diagnosis not present

## 2015-12-14 DIAGNOSIS — D1801 Hemangioma of skin and subcutaneous tissue: Secondary | ICD-10-CM | POA: Diagnosis not present

## 2015-12-14 DIAGNOSIS — L821 Other seborrheic keratosis: Secondary | ICD-10-CM | POA: Diagnosis not present

## 2015-12-14 DIAGNOSIS — L570 Actinic keratosis: Secondary | ICD-10-CM | POA: Diagnosis not present

## 2015-12-14 DIAGNOSIS — Z85828 Personal history of other malignant neoplasm of skin: Secondary | ICD-10-CM | POA: Diagnosis not present

## 2015-12-15 DIAGNOSIS — I10 Essential (primary) hypertension: Secondary | ICD-10-CM | POA: Diagnosis not present

## 2015-12-15 DIAGNOSIS — S72001D Fracture of unspecified part of neck of right femur, subsequent encounter for closed fracture with routine healing: Secondary | ICD-10-CM | POA: Diagnosis not present

## 2015-12-15 DIAGNOSIS — R2689 Other abnormalities of gait and mobility: Secondary | ICD-10-CM | POA: Diagnosis not present

## 2015-12-15 DIAGNOSIS — Z9181 History of falling: Secondary | ICD-10-CM | POA: Diagnosis not present

## 2015-12-15 DIAGNOSIS — F0391 Unspecified dementia with behavioral disturbance: Secondary | ICD-10-CM | POA: Diagnosis not present

## 2015-12-17 DIAGNOSIS — I10 Essential (primary) hypertension: Secondary | ICD-10-CM | POA: Diagnosis not present

## 2015-12-17 DIAGNOSIS — S72001D Fracture of unspecified part of neck of right femur, subsequent encounter for closed fracture with routine healing: Secondary | ICD-10-CM | POA: Diagnosis not present

## 2015-12-17 DIAGNOSIS — Z9181 History of falling: Secondary | ICD-10-CM | POA: Diagnosis not present

## 2015-12-17 DIAGNOSIS — F0391 Unspecified dementia with behavioral disturbance: Secondary | ICD-10-CM | POA: Diagnosis not present

## 2015-12-17 DIAGNOSIS — R2689 Other abnormalities of gait and mobility: Secondary | ICD-10-CM | POA: Diagnosis not present

## 2015-12-22 DIAGNOSIS — Z9181 History of falling: Secondary | ICD-10-CM | POA: Diagnosis not present

## 2015-12-22 DIAGNOSIS — S72001D Fracture of unspecified part of neck of right femur, subsequent encounter for closed fracture with routine healing: Secondary | ICD-10-CM | POA: Diagnosis not present

## 2015-12-22 DIAGNOSIS — R2689 Other abnormalities of gait and mobility: Secondary | ICD-10-CM | POA: Diagnosis not present

## 2015-12-22 DIAGNOSIS — F0391 Unspecified dementia with behavioral disturbance: Secondary | ICD-10-CM | POA: Diagnosis not present

## 2015-12-22 DIAGNOSIS — I10 Essential (primary) hypertension: Secondary | ICD-10-CM | POA: Diagnosis not present

## 2015-12-24 DIAGNOSIS — F0391 Unspecified dementia with behavioral disturbance: Secondary | ICD-10-CM | POA: Diagnosis not present

## 2015-12-24 DIAGNOSIS — I10 Essential (primary) hypertension: Secondary | ICD-10-CM | POA: Diagnosis not present

## 2015-12-24 DIAGNOSIS — R2689 Other abnormalities of gait and mobility: Secondary | ICD-10-CM | POA: Diagnosis not present

## 2015-12-24 DIAGNOSIS — Z9181 History of falling: Secondary | ICD-10-CM | POA: Diagnosis not present

## 2015-12-24 DIAGNOSIS — S72001D Fracture of unspecified part of neck of right femur, subsequent encounter for closed fracture with routine healing: Secondary | ICD-10-CM | POA: Diagnosis not present

## 2015-12-26 DIAGNOSIS — Z23 Encounter for immunization: Secondary | ICD-10-CM | POA: Diagnosis not present

## 2015-12-28 DIAGNOSIS — R269 Unspecified abnormalities of gait and mobility: Secondary | ICD-10-CM | POA: Diagnosis not present

## 2015-12-28 DIAGNOSIS — I10 Essential (primary) hypertension: Secondary | ICD-10-CM | POA: Diagnosis not present

## 2015-12-28 DIAGNOSIS — F0391 Unspecified dementia with behavioral disturbance: Secondary | ICD-10-CM | POA: Diagnosis not present

## 2015-12-28 DIAGNOSIS — F419 Anxiety disorder, unspecified: Secondary | ICD-10-CM | POA: Diagnosis not present

## 2015-12-29 DIAGNOSIS — F0391 Unspecified dementia with behavioral disturbance: Secondary | ICD-10-CM | POA: Diagnosis not present

## 2015-12-29 DIAGNOSIS — R2689 Other abnormalities of gait and mobility: Secondary | ICD-10-CM | POA: Diagnosis not present

## 2015-12-29 DIAGNOSIS — S72001D Fracture of unspecified part of neck of right femur, subsequent encounter for closed fracture with routine healing: Secondary | ICD-10-CM | POA: Diagnosis not present

## 2015-12-29 DIAGNOSIS — Z9181 History of falling: Secondary | ICD-10-CM | POA: Diagnosis not present

## 2015-12-29 DIAGNOSIS — I10 Essential (primary) hypertension: Secondary | ICD-10-CM | POA: Diagnosis not present

## 2015-12-31 DIAGNOSIS — R2689 Other abnormalities of gait and mobility: Secondary | ICD-10-CM | POA: Diagnosis not present

## 2015-12-31 DIAGNOSIS — Z9181 History of falling: Secondary | ICD-10-CM | POA: Diagnosis not present

## 2015-12-31 DIAGNOSIS — S72001D Fracture of unspecified part of neck of right femur, subsequent encounter for closed fracture with routine healing: Secondary | ICD-10-CM | POA: Diagnosis not present

## 2015-12-31 DIAGNOSIS — F0391 Unspecified dementia with behavioral disturbance: Secondary | ICD-10-CM | POA: Diagnosis not present

## 2015-12-31 DIAGNOSIS — I10 Essential (primary) hypertension: Secondary | ICD-10-CM | POA: Diagnosis not present

## 2016-01-05 DIAGNOSIS — I10 Essential (primary) hypertension: Secondary | ICD-10-CM | POA: Diagnosis not present

## 2016-01-05 DIAGNOSIS — S72001D Fracture of unspecified part of neck of right femur, subsequent encounter for closed fracture with routine healing: Secondary | ICD-10-CM | POA: Diagnosis not present

## 2016-01-05 DIAGNOSIS — R2689 Other abnormalities of gait and mobility: Secondary | ICD-10-CM | POA: Diagnosis not present

## 2016-01-05 DIAGNOSIS — F0391 Unspecified dementia with behavioral disturbance: Secondary | ICD-10-CM | POA: Diagnosis not present

## 2016-01-05 DIAGNOSIS — Z9181 History of falling: Secondary | ICD-10-CM | POA: Diagnosis not present

## 2016-01-08 DIAGNOSIS — I10 Essential (primary) hypertension: Secondary | ICD-10-CM | POA: Diagnosis not present

## 2016-01-08 DIAGNOSIS — S72001D Fracture of unspecified part of neck of right femur, subsequent encounter for closed fracture with routine healing: Secondary | ICD-10-CM | POA: Diagnosis not present

## 2016-01-08 DIAGNOSIS — Z9181 History of falling: Secondary | ICD-10-CM | POA: Diagnosis not present

## 2016-01-08 DIAGNOSIS — F0391 Unspecified dementia with behavioral disturbance: Secondary | ICD-10-CM | POA: Diagnosis not present

## 2016-01-08 DIAGNOSIS — R2689 Other abnormalities of gait and mobility: Secondary | ICD-10-CM | POA: Diagnosis not present

## 2016-01-08 DIAGNOSIS — F419 Anxiety disorder, unspecified: Secondary | ICD-10-CM | POA: Diagnosis not present

## 2016-01-12 DIAGNOSIS — Z9181 History of falling: Secondary | ICD-10-CM | POA: Diagnosis not present

## 2016-01-12 DIAGNOSIS — F0391 Unspecified dementia with behavioral disturbance: Secondary | ICD-10-CM | POA: Diagnosis not present

## 2016-01-12 DIAGNOSIS — R2689 Other abnormalities of gait and mobility: Secondary | ICD-10-CM | POA: Diagnosis not present

## 2016-01-12 DIAGNOSIS — S72001D Fracture of unspecified part of neck of right femur, subsequent encounter for closed fracture with routine healing: Secondary | ICD-10-CM | POA: Diagnosis not present

## 2016-01-12 DIAGNOSIS — I10 Essential (primary) hypertension: Secondary | ICD-10-CM | POA: Diagnosis not present

## 2016-01-14 DIAGNOSIS — Z9181 History of falling: Secondary | ICD-10-CM | POA: Diagnosis not present

## 2016-01-14 DIAGNOSIS — R2689 Other abnormalities of gait and mobility: Secondary | ICD-10-CM | POA: Diagnosis not present

## 2016-01-14 DIAGNOSIS — S72001D Fracture of unspecified part of neck of right femur, subsequent encounter for closed fracture with routine healing: Secondary | ICD-10-CM | POA: Diagnosis not present

## 2016-01-14 DIAGNOSIS — I10 Essential (primary) hypertension: Secondary | ICD-10-CM | POA: Diagnosis not present

## 2016-01-14 DIAGNOSIS — F0391 Unspecified dementia with behavioral disturbance: Secondary | ICD-10-CM | POA: Diagnosis not present

## 2016-01-18 DIAGNOSIS — R2689 Other abnormalities of gait and mobility: Secondary | ICD-10-CM | POA: Diagnosis not present

## 2016-01-18 DIAGNOSIS — Z9181 History of falling: Secondary | ICD-10-CM | POA: Diagnosis not present

## 2016-01-18 DIAGNOSIS — I10 Essential (primary) hypertension: Secondary | ICD-10-CM | POA: Diagnosis not present

## 2016-01-18 DIAGNOSIS — S72001D Fracture of unspecified part of neck of right femur, subsequent encounter for closed fracture with routine healing: Secondary | ICD-10-CM | POA: Diagnosis not present

## 2016-01-18 DIAGNOSIS — F0391 Unspecified dementia with behavioral disturbance: Secondary | ICD-10-CM | POA: Diagnosis not present

## 2016-01-21 DIAGNOSIS — Z9181 History of falling: Secondary | ICD-10-CM | POA: Diagnosis not present

## 2016-01-21 DIAGNOSIS — F0391 Unspecified dementia with behavioral disturbance: Secondary | ICD-10-CM | POA: Diagnosis not present

## 2016-01-21 DIAGNOSIS — S72001D Fracture of unspecified part of neck of right femur, subsequent encounter for closed fracture with routine healing: Secondary | ICD-10-CM | POA: Diagnosis not present

## 2016-01-21 DIAGNOSIS — R2689 Other abnormalities of gait and mobility: Secondary | ICD-10-CM | POA: Diagnosis not present

## 2016-01-21 DIAGNOSIS — I10 Essential (primary) hypertension: Secondary | ICD-10-CM | POA: Diagnosis not present

## 2016-01-25 DIAGNOSIS — F0391 Unspecified dementia with behavioral disturbance: Secondary | ICD-10-CM | POA: Diagnosis not present

## 2016-01-25 DIAGNOSIS — R269 Unspecified abnormalities of gait and mobility: Secondary | ICD-10-CM | POA: Diagnosis not present

## 2016-01-25 DIAGNOSIS — I10 Essential (primary) hypertension: Secondary | ICD-10-CM | POA: Diagnosis not present

## 2016-01-25 DIAGNOSIS — F419 Anxiety disorder, unspecified: Secondary | ICD-10-CM | POA: Diagnosis not present

## 2016-01-26 DIAGNOSIS — F0391 Unspecified dementia with behavioral disturbance: Secondary | ICD-10-CM | POA: Diagnosis not present

## 2016-01-26 DIAGNOSIS — R2689 Other abnormalities of gait and mobility: Secondary | ICD-10-CM | POA: Diagnosis not present

## 2016-01-26 DIAGNOSIS — Z9181 History of falling: Secondary | ICD-10-CM | POA: Diagnosis not present

## 2016-01-26 DIAGNOSIS — S72001D Fracture of unspecified part of neck of right femur, subsequent encounter for closed fracture with routine healing: Secondary | ICD-10-CM | POA: Diagnosis not present

## 2016-01-26 DIAGNOSIS — I10 Essential (primary) hypertension: Secondary | ICD-10-CM | POA: Diagnosis not present

## 2016-01-27 DIAGNOSIS — F0391 Unspecified dementia with behavioral disturbance: Secondary | ICD-10-CM | POA: Diagnosis not present

## 2016-01-27 DIAGNOSIS — S72001D Fracture of unspecified part of neck of right femur, subsequent encounter for closed fracture with routine healing: Secondary | ICD-10-CM | POA: Diagnosis not present

## 2016-01-27 DIAGNOSIS — I10 Essential (primary) hypertension: Secondary | ICD-10-CM | POA: Diagnosis not present

## 2016-01-27 DIAGNOSIS — R2689 Other abnormalities of gait and mobility: Secondary | ICD-10-CM | POA: Diagnosis not present

## 2016-01-27 DIAGNOSIS — Z9181 History of falling: Secondary | ICD-10-CM | POA: Diagnosis not present

## 2016-01-28 DIAGNOSIS — S72001D Fracture of unspecified part of neck of right femur, subsequent encounter for closed fracture with routine healing: Secondary | ICD-10-CM | POA: Diagnosis not present

## 2016-01-28 DIAGNOSIS — I10 Essential (primary) hypertension: Secondary | ICD-10-CM | POA: Diagnosis not present

## 2016-01-28 DIAGNOSIS — Z9181 History of falling: Secondary | ICD-10-CM | POA: Diagnosis not present

## 2016-01-28 DIAGNOSIS — F0391 Unspecified dementia with behavioral disturbance: Secondary | ICD-10-CM | POA: Diagnosis not present

## 2016-01-28 DIAGNOSIS — R2689 Other abnormalities of gait and mobility: Secondary | ICD-10-CM | POA: Diagnosis not present

## 2016-01-29 DIAGNOSIS — F419 Anxiety disorder, unspecified: Secondary | ICD-10-CM | POA: Diagnosis not present

## 2016-01-29 DIAGNOSIS — I10 Essential (primary) hypertension: Secondary | ICD-10-CM | POA: Diagnosis not present

## 2016-02-01 DIAGNOSIS — S72001D Fracture of unspecified part of neck of right femur, subsequent encounter for closed fracture with routine healing: Secondary | ICD-10-CM | POA: Diagnosis not present

## 2016-02-01 DIAGNOSIS — R2689 Other abnormalities of gait and mobility: Secondary | ICD-10-CM | POA: Diagnosis not present

## 2016-02-01 DIAGNOSIS — Z9181 History of falling: Secondary | ICD-10-CM | POA: Diagnosis not present

## 2016-02-01 DIAGNOSIS — F0391 Unspecified dementia with behavioral disturbance: Secondary | ICD-10-CM | POA: Diagnosis not present

## 2016-02-01 DIAGNOSIS — I10 Essential (primary) hypertension: Secondary | ICD-10-CM | POA: Diagnosis not present

## 2016-02-02 DIAGNOSIS — Z9181 History of falling: Secondary | ICD-10-CM | POA: Diagnosis not present

## 2016-02-02 DIAGNOSIS — F0391 Unspecified dementia with behavioral disturbance: Secondary | ICD-10-CM | POA: Diagnosis not present

## 2016-02-02 DIAGNOSIS — R2689 Other abnormalities of gait and mobility: Secondary | ICD-10-CM | POA: Diagnosis not present

## 2016-02-02 DIAGNOSIS — I10 Essential (primary) hypertension: Secondary | ICD-10-CM | POA: Diagnosis not present

## 2016-02-02 DIAGNOSIS — S72001D Fracture of unspecified part of neck of right femur, subsequent encounter for closed fracture with routine healing: Secondary | ICD-10-CM | POA: Diagnosis not present

## 2016-02-04 DIAGNOSIS — F0391 Unspecified dementia with behavioral disturbance: Secondary | ICD-10-CM | POA: Diagnosis not present

## 2016-02-04 DIAGNOSIS — Z9181 History of falling: Secondary | ICD-10-CM | POA: Diagnosis not present

## 2016-02-04 DIAGNOSIS — R2689 Other abnormalities of gait and mobility: Secondary | ICD-10-CM | POA: Diagnosis not present

## 2016-02-04 DIAGNOSIS — I10 Essential (primary) hypertension: Secondary | ICD-10-CM | POA: Diagnosis not present

## 2016-02-04 DIAGNOSIS — S72001D Fracture of unspecified part of neck of right femur, subsequent encounter for closed fracture with routine healing: Secondary | ICD-10-CM | POA: Diagnosis not present

## 2016-02-08 DIAGNOSIS — Z9181 History of falling: Secondary | ICD-10-CM | POA: Diagnosis not present

## 2016-02-08 DIAGNOSIS — F0391 Unspecified dementia with behavioral disturbance: Secondary | ICD-10-CM | POA: Diagnosis not present

## 2016-02-08 DIAGNOSIS — I10 Essential (primary) hypertension: Secondary | ICD-10-CM | POA: Diagnosis not present

## 2016-02-08 DIAGNOSIS — R2689 Other abnormalities of gait and mobility: Secondary | ICD-10-CM | POA: Diagnosis not present

## 2016-02-08 DIAGNOSIS — S72001D Fracture of unspecified part of neck of right femur, subsequent encounter for closed fracture with routine healing: Secondary | ICD-10-CM | POA: Diagnosis not present

## 2016-02-09 DIAGNOSIS — Z9181 History of falling: Secondary | ICD-10-CM | POA: Diagnosis not present

## 2016-02-09 DIAGNOSIS — I10 Essential (primary) hypertension: Secondary | ICD-10-CM | POA: Diagnosis not present

## 2016-02-09 DIAGNOSIS — S72001D Fracture of unspecified part of neck of right femur, subsequent encounter for closed fracture with routine healing: Secondary | ICD-10-CM | POA: Diagnosis not present

## 2016-02-09 DIAGNOSIS — R2689 Other abnormalities of gait and mobility: Secondary | ICD-10-CM | POA: Diagnosis not present

## 2016-02-09 DIAGNOSIS — F0391 Unspecified dementia with behavioral disturbance: Secondary | ICD-10-CM | POA: Diagnosis not present

## 2016-02-11 DIAGNOSIS — I10 Essential (primary) hypertension: Secondary | ICD-10-CM | POA: Diagnosis not present

## 2016-02-11 DIAGNOSIS — Z9181 History of falling: Secondary | ICD-10-CM | POA: Diagnosis not present

## 2016-02-11 DIAGNOSIS — R2689 Other abnormalities of gait and mobility: Secondary | ICD-10-CM | POA: Diagnosis not present

## 2016-02-11 DIAGNOSIS — F0391 Unspecified dementia with behavioral disturbance: Secondary | ICD-10-CM | POA: Diagnosis not present

## 2016-02-11 DIAGNOSIS — S72001D Fracture of unspecified part of neck of right femur, subsequent encounter for closed fracture with routine healing: Secondary | ICD-10-CM | POA: Diagnosis not present

## 2016-02-15 DIAGNOSIS — S72001D Fracture of unspecified part of neck of right femur, subsequent encounter for closed fracture with routine healing: Secondary | ICD-10-CM | POA: Diagnosis not present

## 2016-02-15 DIAGNOSIS — Z9181 History of falling: Secondary | ICD-10-CM | POA: Diagnosis not present

## 2016-02-15 DIAGNOSIS — F0391 Unspecified dementia with behavioral disturbance: Secondary | ICD-10-CM | POA: Diagnosis not present

## 2016-02-15 DIAGNOSIS — I10 Essential (primary) hypertension: Secondary | ICD-10-CM | POA: Diagnosis not present

## 2016-02-15 DIAGNOSIS — R2689 Other abnormalities of gait and mobility: Secondary | ICD-10-CM | POA: Diagnosis not present

## 2016-02-18 DIAGNOSIS — Z9181 History of falling: Secondary | ICD-10-CM | POA: Diagnosis not present

## 2016-02-18 DIAGNOSIS — R2689 Other abnormalities of gait and mobility: Secondary | ICD-10-CM | POA: Diagnosis not present

## 2016-02-18 DIAGNOSIS — F0391 Unspecified dementia with behavioral disturbance: Secondary | ICD-10-CM | POA: Diagnosis not present

## 2016-02-18 DIAGNOSIS — S72001D Fracture of unspecified part of neck of right femur, subsequent encounter for closed fracture with routine healing: Secondary | ICD-10-CM | POA: Diagnosis not present

## 2016-02-18 DIAGNOSIS — I10 Essential (primary) hypertension: Secondary | ICD-10-CM | POA: Diagnosis not present

## 2016-02-19 DIAGNOSIS — F0391 Unspecified dementia with behavioral disturbance: Secondary | ICD-10-CM | POA: Diagnosis not present

## 2016-02-19 DIAGNOSIS — R2689 Other abnormalities of gait and mobility: Secondary | ICD-10-CM | POA: Diagnosis not present

## 2016-02-19 DIAGNOSIS — Z9181 History of falling: Secondary | ICD-10-CM | POA: Diagnosis not present

## 2016-02-19 DIAGNOSIS — I10 Essential (primary) hypertension: Secondary | ICD-10-CM | POA: Diagnosis not present

## 2016-02-19 DIAGNOSIS — S72001D Fracture of unspecified part of neck of right femur, subsequent encounter for closed fracture with routine healing: Secondary | ICD-10-CM | POA: Diagnosis not present

## 2016-02-23 DIAGNOSIS — F0391 Unspecified dementia with behavioral disturbance: Secondary | ICD-10-CM | POA: Diagnosis not present

## 2016-02-23 DIAGNOSIS — Z9181 History of falling: Secondary | ICD-10-CM | POA: Diagnosis not present

## 2016-02-23 DIAGNOSIS — I10 Essential (primary) hypertension: Secondary | ICD-10-CM | POA: Diagnosis not present

## 2016-02-23 DIAGNOSIS — R2689 Other abnormalities of gait and mobility: Secondary | ICD-10-CM | POA: Diagnosis not present

## 2016-02-23 DIAGNOSIS — S72001D Fracture of unspecified part of neck of right femur, subsequent encounter for closed fracture with routine healing: Secondary | ICD-10-CM | POA: Diagnosis not present

## 2016-02-24 DIAGNOSIS — F0391 Unspecified dementia with behavioral disturbance: Secondary | ICD-10-CM | POA: Diagnosis not present

## 2016-02-24 DIAGNOSIS — R2689 Other abnormalities of gait and mobility: Secondary | ICD-10-CM | POA: Diagnosis not present

## 2016-02-24 DIAGNOSIS — Z9181 History of falling: Secondary | ICD-10-CM | POA: Diagnosis not present

## 2016-02-24 DIAGNOSIS — S72001D Fracture of unspecified part of neck of right femur, subsequent encounter for closed fracture with routine healing: Secondary | ICD-10-CM | POA: Diagnosis not present

## 2016-02-24 DIAGNOSIS — I10 Essential (primary) hypertension: Secondary | ICD-10-CM | POA: Diagnosis not present

## 2016-03-02 DIAGNOSIS — F419 Anxiety disorder, unspecified: Secondary | ICD-10-CM | POA: Diagnosis not present

## 2016-03-02 DIAGNOSIS — I10 Essential (primary) hypertension: Secondary | ICD-10-CM | POA: Diagnosis not present

## 2016-03-02 DIAGNOSIS — R269 Unspecified abnormalities of gait and mobility: Secondary | ICD-10-CM | POA: Diagnosis not present

## 2016-03-02 DIAGNOSIS — F0391 Unspecified dementia with behavioral disturbance: Secondary | ICD-10-CM | POA: Diagnosis not present

## 2016-03-29 DIAGNOSIS — I10 Essential (primary) hypertension: Secondary | ICD-10-CM | POA: Diagnosis not present

## 2016-03-29 DIAGNOSIS — Z7982 Long term (current) use of aspirin: Secondary | ICD-10-CM | POA: Diagnosis not present

## 2016-03-29 DIAGNOSIS — Z9181 History of falling: Secondary | ICD-10-CM | POA: Diagnosis not present

## 2016-03-29 DIAGNOSIS — R1312 Dysphagia, oropharyngeal phase: Secondary | ICD-10-CM | POA: Diagnosis not present

## 2016-03-29 DIAGNOSIS — F0391 Unspecified dementia with behavioral disturbance: Secondary | ICD-10-CM | POA: Diagnosis not present

## 2016-03-29 DIAGNOSIS — S72001D Fracture of unspecified part of neck of right femur, subsequent encounter for closed fracture with routine healing: Secondary | ICD-10-CM | POA: Diagnosis not present

## 2016-03-31 DIAGNOSIS — Z7982 Long term (current) use of aspirin: Secondary | ICD-10-CM | POA: Diagnosis not present

## 2016-03-31 DIAGNOSIS — F0391 Unspecified dementia with behavioral disturbance: Secondary | ICD-10-CM | POA: Diagnosis not present

## 2016-03-31 DIAGNOSIS — I10 Essential (primary) hypertension: Secondary | ICD-10-CM | POA: Diagnosis not present

## 2016-03-31 DIAGNOSIS — R1312 Dysphagia, oropharyngeal phase: Secondary | ICD-10-CM | POA: Diagnosis not present

## 2016-03-31 DIAGNOSIS — S72001D Fracture of unspecified part of neck of right femur, subsequent encounter for closed fracture with routine healing: Secondary | ICD-10-CM | POA: Diagnosis not present

## 2016-03-31 DIAGNOSIS — Z9181 History of falling: Secondary | ICD-10-CM | POA: Diagnosis not present

## 2016-04-06 DIAGNOSIS — F0391 Unspecified dementia with behavioral disturbance: Secondary | ICD-10-CM | POA: Diagnosis not present

## 2016-04-06 DIAGNOSIS — Z9181 History of falling: Secondary | ICD-10-CM | POA: Diagnosis not present

## 2016-04-06 DIAGNOSIS — Z7982 Long term (current) use of aspirin: Secondary | ICD-10-CM | POA: Diagnosis not present

## 2016-04-06 DIAGNOSIS — S72001D Fracture of unspecified part of neck of right femur, subsequent encounter for closed fracture with routine healing: Secondary | ICD-10-CM | POA: Diagnosis not present

## 2016-04-06 DIAGNOSIS — I10 Essential (primary) hypertension: Secondary | ICD-10-CM | POA: Diagnosis not present

## 2016-04-06 DIAGNOSIS — R1312 Dysphagia, oropharyngeal phase: Secondary | ICD-10-CM | POA: Diagnosis not present

## 2016-04-08 DIAGNOSIS — Z9181 History of falling: Secondary | ICD-10-CM | POA: Diagnosis not present

## 2016-04-08 DIAGNOSIS — S72001D Fracture of unspecified part of neck of right femur, subsequent encounter for closed fracture with routine healing: Secondary | ICD-10-CM | POA: Diagnosis not present

## 2016-04-08 DIAGNOSIS — I10 Essential (primary) hypertension: Secondary | ICD-10-CM | POA: Diagnosis not present

## 2016-04-08 DIAGNOSIS — R1312 Dysphagia, oropharyngeal phase: Secondary | ICD-10-CM | POA: Diagnosis not present

## 2016-04-08 DIAGNOSIS — F0391 Unspecified dementia with behavioral disturbance: Secondary | ICD-10-CM | POA: Diagnosis not present

## 2016-04-08 DIAGNOSIS — Z7982 Long term (current) use of aspirin: Secondary | ICD-10-CM | POA: Diagnosis not present

## 2016-04-11 DIAGNOSIS — Z9181 History of falling: Secondary | ICD-10-CM | POA: Diagnosis not present

## 2016-04-11 DIAGNOSIS — F0391 Unspecified dementia with behavioral disturbance: Secondary | ICD-10-CM | POA: Diagnosis not present

## 2016-04-11 DIAGNOSIS — Z7982 Long term (current) use of aspirin: Secondary | ICD-10-CM | POA: Diagnosis not present

## 2016-04-11 DIAGNOSIS — S72001D Fracture of unspecified part of neck of right femur, subsequent encounter for closed fracture with routine healing: Secondary | ICD-10-CM | POA: Diagnosis not present

## 2016-04-11 DIAGNOSIS — R1312 Dysphagia, oropharyngeal phase: Secondary | ICD-10-CM | POA: Diagnosis not present

## 2016-04-11 DIAGNOSIS — I10 Essential (primary) hypertension: Secondary | ICD-10-CM | POA: Diagnosis not present

## 2016-04-12 DIAGNOSIS — S72001D Fracture of unspecified part of neck of right femur, subsequent encounter for closed fracture with routine healing: Secondary | ICD-10-CM | POA: Diagnosis not present

## 2016-04-12 DIAGNOSIS — Z9181 History of falling: Secondary | ICD-10-CM | POA: Diagnosis not present

## 2016-04-12 DIAGNOSIS — R1312 Dysphagia, oropharyngeal phase: Secondary | ICD-10-CM | POA: Diagnosis not present

## 2016-04-12 DIAGNOSIS — Z7982 Long term (current) use of aspirin: Secondary | ICD-10-CM | POA: Diagnosis not present

## 2016-04-12 DIAGNOSIS — I10 Essential (primary) hypertension: Secondary | ICD-10-CM | POA: Diagnosis not present

## 2016-04-12 DIAGNOSIS — F0391 Unspecified dementia with behavioral disturbance: Secondary | ICD-10-CM | POA: Diagnosis not present

## 2016-04-18 DIAGNOSIS — I10 Essential (primary) hypertension: Secondary | ICD-10-CM | POA: Diagnosis not present

## 2016-04-18 DIAGNOSIS — F0391 Unspecified dementia with behavioral disturbance: Secondary | ICD-10-CM | POA: Diagnosis not present

## 2016-04-18 DIAGNOSIS — F419 Anxiety disorder, unspecified: Secondary | ICD-10-CM | POA: Diagnosis not present

## 2016-04-18 DIAGNOSIS — S72001D Fracture of unspecified part of neck of right femur, subsequent encounter for closed fracture with routine healing: Secondary | ICD-10-CM | POA: Diagnosis not present

## 2016-04-18 DIAGNOSIS — R630 Anorexia: Secondary | ICD-10-CM | POA: Diagnosis not present

## 2016-04-18 DIAGNOSIS — R1312 Dysphagia, oropharyngeal phase: Secondary | ICD-10-CM | POA: Diagnosis not present

## 2016-04-18 DIAGNOSIS — Z7982 Long term (current) use of aspirin: Secondary | ICD-10-CM | POA: Diagnosis not present

## 2016-04-18 DIAGNOSIS — R269 Unspecified abnormalities of gait and mobility: Secondary | ICD-10-CM | POA: Diagnosis not present

## 2016-04-18 DIAGNOSIS — Z9181 History of falling: Secondary | ICD-10-CM | POA: Diagnosis not present

## 2016-04-25 DIAGNOSIS — I10 Essential (primary) hypertension: Secondary | ICD-10-CM | POA: Diagnosis not present

## 2016-04-25 DIAGNOSIS — S72001D Fracture of unspecified part of neck of right femur, subsequent encounter for closed fracture with routine healing: Secondary | ICD-10-CM | POA: Diagnosis not present

## 2016-04-25 DIAGNOSIS — Z9181 History of falling: Secondary | ICD-10-CM | POA: Diagnosis not present

## 2016-04-25 DIAGNOSIS — R1312 Dysphagia, oropharyngeal phase: Secondary | ICD-10-CM | POA: Diagnosis not present

## 2016-04-25 DIAGNOSIS — Z7982 Long term (current) use of aspirin: Secondary | ICD-10-CM | POA: Diagnosis not present

## 2016-04-25 DIAGNOSIS — F0391 Unspecified dementia with behavioral disturbance: Secondary | ICD-10-CM | POA: Diagnosis not present

## 2016-05-30 DIAGNOSIS — F0391 Unspecified dementia with behavioral disturbance: Secondary | ICD-10-CM | POA: Diagnosis not present

## 2016-05-30 DIAGNOSIS — I10 Essential (primary) hypertension: Secondary | ICD-10-CM | POA: Diagnosis not present

## 2016-05-30 DIAGNOSIS — R269 Unspecified abnormalities of gait and mobility: Secondary | ICD-10-CM | POA: Diagnosis not present

## 2016-05-30 DIAGNOSIS — F419 Anxiety disorder, unspecified: Secondary | ICD-10-CM | POA: Diagnosis not present

## 2016-05-30 DIAGNOSIS — R633 Feeding difficulties: Secondary | ICD-10-CM | POA: Diagnosis not present

## 2016-06-06 DIAGNOSIS — I10 Essential (primary) hypertension: Secondary | ICD-10-CM | POA: Diagnosis not present

## 2016-06-06 DIAGNOSIS — E43 Unspecified severe protein-calorie malnutrition: Secondary | ICD-10-CM | POA: Diagnosis not present

## 2016-06-06 DIAGNOSIS — Z515 Encounter for palliative care: Secondary | ICD-10-CM | POA: Diagnosis not present

## 2016-06-06 DIAGNOSIS — I25119 Atherosclerotic heart disease of native coronary artery with unspecified angina pectoris: Secondary | ICD-10-CM | POA: Diagnosis not present

## 2016-06-06 DIAGNOSIS — M353 Polymyalgia rheumatica: Secondary | ICD-10-CM | POA: Diagnosis not present

## 2016-06-06 DIAGNOSIS — F339 Major depressive disorder, recurrent, unspecified: Secondary | ICD-10-CM | POA: Diagnosis not present

## 2016-06-06 DIAGNOSIS — Z66 Do not resuscitate: Secondary | ICD-10-CM | POA: Diagnosis not present

## 2016-06-06 DIAGNOSIS — G309 Alzheimer's disease, unspecified: Secondary | ICD-10-CM | POA: Diagnosis not present

## 2016-06-07 DIAGNOSIS — I25119 Atherosclerotic heart disease of native coronary artery with unspecified angina pectoris: Secondary | ICD-10-CM | POA: Diagnosis not present

## 2016-06-07 DIAGNOSIS — F339 Major depressive disorder, recurrent, unspecified: Secondary | ICD-10-CM | POA: Diagnosis not present

## 2016-06-07 DIAGNOSIS — E43 Unspecified severe protein-calorie malnutrition: Secondary | ICD-10-CM | POA: Diagnosis not present

## 2016-06-07 DIAGNOSIS — G309 Alzheimer's disease, unspecified: Secondary | ICD-10-CM | POA: Diagnosis not present

## 2016-06-07 DIAGNOSIS — I10 Essential (primary) hypertension: Secondary | ICD-10-CM | POA: Diagnosis not present

## 2016-06-07 DIAGNOSIS — M353 Polymyalgia rheumatica: Secondary | ICD-10-CM | POA: Diagnosis not present

## 2016-06-08 DIAGNOSIS — G309 Alzheimer's disease, unspecified: Secondary | ICD-10-CM | POA: Diagnosis not present

## 2016-06-08 DIAGNOSIS — E43 Unspecified severe protein-calorie malnutrition: Secondary | ICD-10-CM | POA: Diagnosis not present

## 2016-06-08 DIAGNOSIS — I25119 Atherosclerotic heart disease of native coronary artery with unspecified angina pectoris: Secondary | ICD-10-CM | POA: Diagnosis not present

## 2016-06-08 DIAGNOSIS — I10 Essential (primary) hypertension: Secondary | ICD-10-CM | POA: Diagnosis not present

## 2016-06-08 DIAGNOSIS — M353 Polymyalgia rheumatica: Secondary | ICD-10-CM | POA: Diagnosis not present

## 2016-06-08 DIAGNOSIS — F339 Major depressive disorder, recurrent, unspecified: Secondary | ICD-10-CM | POA: Diagnosis not present

## 2016-06-09 DIAGNOSIS — I10 Essential (primary) hypertension: Secondary | ICD-10-CM | POA: Diagnosis not present

## 2016-06-09 DIAGNOSIS — G309 Alzheimer's disease, unspecified: Secondary | ICD-10-CM | POA: Diagnosis not present

## 2016-06-09 DIAGNOSIS — I25119 Atherosclerotic heart disease of native coronary artery with unspecified angina pectoris: Secondary | ICD-10-CM | POA: Diagnosis not present

## 2016-06-09 DIAGNOSIS — F339 Major depressive disorder, recurrent, unspecified: Secondary | ICD-10-CM | POA: Diagnosis not present

## 2016-06-09 DIAGNOSIS — M353 Polymyalgia rheumatica: Secondary | ICD-10-CM | POA: Diagnosis not present

## 2016-06-09 DIAGNOSIS — E43 Unspecified severe protein-calorie malnutrition: Secondary | ICD-10-CM | POA: Diagnosis not present

## 2016-06-10 DIAGNOSIS — I25119 Atherosclerotic heart disease of native coronary artery with unspecified angina pectoris: Secondary | ICD-10-CM | POA: Diagnosis not present

## 2016-06-10 DIAGNOSIS — G309 Alzheimer's disease, unspecified: Secondary | ICD-10-CM | POA: Diagnosis not present

## 2016-06-10 DIAGNOSIS — I10 Essential (primary) hypertension: Secondary | ICD-10-CM | POA: Diagnosis not present

## 2016-06-10 DIAGNOSIS — E43 Unspecified severe protein-calorie malnutrition: Secondary | ICD-10-CM | POA: Diagnosis not present

## 2016-06-10 DIAGNOSIS — F339 Major depressive disorder, recurrent, unspecified: Secondary | ICD-10-CM | POA: Diagnosis not present

## 2016-06-10 DIAGNOSIS — M353 Polymyalgia rheumatica: Secondary | ICD-10-CM | POA: Diagnosis not present

## 2016-06-13 DIAGNOSIS — I25119 Atherosclerotic heart disease of native coronary artery with unspecified angina pectoris: Secondary | ICD-10-CM | POA: Diagnosis not present

## 2016-06-13 DIAGNOSIS — G309 Alzheimer's disease, unspecified: Secondary | ICD-10-CM | POA: Diagnosis not present

## 2016-06-13 DIAGNOSIS — M353 Polymyalgia rheumatica: Secondary | ICD-10-CM | POA: Diagnosis not present

## 2016-06-13 DIAGNOSIS — E43 Unspecified severe protein-calorie malnutrition: Secondary | ICD-10-CM | POA: Diagnosis not present

## 2016-06-13 DIAGNOSIS — I10 Essential (primary) hypertension: Secondary | ICD-10-CM | POA: Diagnosis not present

## 2016-06-13 DIAGNOSIS — F339 Major depressive disorder, recurrent, unspecified: Secondary | ICD-10-CM | POA: Diagnosis not present

## 2016-06-14 DIAGNOSIS — M353 Polymyalgia rheumatica: Secondary | ICD-10-CM | POA: Diagnosis not present

## 2016-06-14 DIAGNOSIS — F339 Major depressive disorder, recurrent, unspecified: Secondary | ICD-10-CM | POA: Diagnosis not present

## 2016-06-14 DIAGNOSIS — E43 Unspecified severe protein-calorie malnutrition: Secondary | ICD-10-CM | POA: Diagnosis not present

## 2016-06-14 DIAGNOSIS — I10 Essential (primary) hypertension: Secondary | ICD-10-CM | POA: Diagnosis not present

## 2016-06-14 DIAGNOSIS — I25119 Atherosclerotic heart disease of native coronary artery with unspecified angina pectoris: Secondary | ICD-10-CM | POA: Diagnosis not present

## 2016-06-14 DIAGNOSIS — G309 Alzheimer's disease, unspecified: Secondary | ICD-10-CM | POA: Diagnosis not present

## 2016-06-15 DIAGNOSIS — I10 Essential (primary) hypertension: Secondary | ICD-10-CM | POA: Diagnosis not present

## 2016-06-15 DIAGNOSIS — G309 Alzheimer's disease, unspecified: Secondary | ICD-10-CM | POA: Diagnosis not present

## 2016-06-15 DIAGNOSIS — I25119 Atherosclerotic heart disease of native coronary artery with unspecified angina pectoris: Secondary | ICD-10-CM | POA: Diagnosis not present

## 2016-06-15 DIAGNOSIS — E43 Unspecified severe protein-calorie malnutrition: Secondary | ICD-10-CM | POA: Diagnosis not present

## 2016-06-15 DIAGNOSIS — M353 Polymyalgia rheumatica: Secondary | ICD-10-CM | POA: Diagnosis not present

## 2016-06-15 DIAGNOSIS — F339 Major depressive disorder, recurrent, unspecified: Secondary | ICD-10-CM | POA: Diagnosis not present

## 2016-06-16 DIAGNOSIS — F339 Major depressive disorder, recurrent, unspecified: Secondary | ICD-10-CM | POA: Diagnosis not present

## 2016-06-16 DIAGNOSIS — M353 Polymyalgia rheumatica: Secondary | ICD-10-CM | POA: Diagnosis not present

## 2016-06-16 DIAGNOSIS — I10 Essential (primary) hypertension: Secondary | ICD-10-CM | POA: Diagnosis not present

## 2016-06-16 DIAGNOSIS — G309 Alzheimer's disease, unspecified: Secondary | ICD-10-CM | POA: Diagnosis not present

## 2016-06-16 DIAGNOSIS — I25119 Atherosclerotic heart disease of native coronary artery with unspecified angina pectoris: Secondary | ICD-10-CM | POA: Diagnosis not present

## 2016-06-16 DIAGNOSIS — E43 Unspecified severe protein-calorie malnutrition: Secondary | ICD-10-CM | POA: Diagnosis not present

## 2016-06-17 DIAGNOSIS — F339 Major depressive disorder, recurrent, unspecified: Secondary | ICD-10-CM | POA: Diagnosis not present

## 2016-06-17 DIAGNOSIS — I25119 Atherosclerotic heart disease of native coronary artery with unspecified angina pectoris: Secondary | ICD-10-CM | POA: Diagnosis not present

## 2016-06-17 DIAGNOSIS — M353 Polymyalgia rheumatica: Secondary | ICD-10-CM | POA: Diagnosis not present

## 2016-06-17 DIAGNOSIS — G309 Alzheimer's disease, unspecified: Secondary | ICD-10-CM | POA: Diagnosis not present

## 2016-06-17 DIAGNOSIS — E43 Unspecified severe protein-calorie malnutrition: Secondary | ICD-10-CM | POA: Diagnosis not present

## 2016-06-17 DIAGNOSIS — I10 Essential (primary) hypertension: Secondary | ICD-10-CM | POA: Diagnosis not present

## 2016-06-19 DIAGNOSIS — I25119 Atherosclerotic heart disease of native coronary artery with unspecified angina pectoris: Secondary | ICD-10-CM | POA: Diagnosis not present

## 2016-06-19 DIAGNOSIS — G309 Alzheimer's disease, unspecified: Secondary | ICD-10-CM | POA: Diagnosis not present

## 2016-06-19 DIAGNOSIS — M353 Polymyalgia rheumatica: Secondary | ICD-10-CM | POA: Diagnosis not present

## 2016-06-19 DIAGNOSIS — I10 Essential (primary) hypertension: Secondary | ICD-10-CM | POA: Diagnosis not present

## 2016-06-19 DIAGNOSIS — F339 Major depressive disorder, recurrent, unspecified: Secondary | ICD-10-CM | POA: Diagnosis not present

## 2016-06-19 DIAGNOSIS — E43 Unspecified severe protein-calorie malnutrition: Secondary | ICD-10-CM | POA: Diagnosis not present

## 2016-06-20 DIAGNOSIS — M353 Polymyalgia rheumatica: Secondary | ICD-10-CM | POA: Diagnosis not present

## 2016-06-20 DIAGNOSIS — G309 Alzheimer's disease, unspecified: Secondary | ICD-10-CM | POA: Diagnosis not present

## 2016-06-20 DIAGNOSIS — Z515 Encounter for palliative care: Secondary | ICD-10-CM | POA: Diagnosis not present

## 2016-06-20 DIAGNOSIS — E43 Unspecified severe protein-calorie malnutrition: Secondary | ICD-10-CM | POA: Diagnosis not present

## 2016-06-20 DIAGNOSIS — E46 Unspecified protein-calorie malnutrition: Secondary | ICD-10-CM | POA: Diagnosis not present

## 2016-06-20 DIAGNOSIS — R4020 Unspecified coma: Secondary | ICD-10-CM | POA: Diagnosis not present

## 2016-06-20 DIAGNOSIS — I10 Essential (primary) hypertension: Secondary | ICD-10-CM | POA: Diagnosis not present

## 2016-06-20 DIAGNOSIS — F339 Major depressive disorder, recurrent, unspecified: Secondary | ICD-10-CM | POA: Diagnosis not present

## 2016-06-20 DIAGNOSIS — I25119 Atherosclerotic heart disease of native coronary artery with unspecified angina pectoris: Secondary | ICD-10-CM | POA: Diagnosis not present

## 2016-06-21 DIAGNOSIS — F339 Major depressive disorder, recurrent, unspecified: Secondary | ICD-10-CM | POA: Diagnosis not present

## 2016-06-21 DIAGNOSIS — I10 Essential (primary) hypertension: Secondary | ICD-10-CM | POA: Diagnosis not present

## 2016-06-21 DIAGNOSIS — E46 Unspecified protein-calorie malnutrition: Secondary | ICD-10-CM | POA: Diagnosis not present

## 2016-06-21 DIAGNOSIS — G309 Alzheimer's disease, unspecified: Secondary | ICD-10-CM | POA: Diagnosis not present

## 2016-07-03 DEATH — deceased

## 2017-09-09 IMAGING — CR DG HIP (WITH OR WITHOUT PELVIS) 2-3V*R*
3 series · 3 of 3 positions shown · non-contrast
Comparison: No comparison studies available.

CLINICAL DATA: Fell at [HOSPITAL] today and landed on right side.
Right hip pain after fall.

EXAM:
DG HIP (WITH OR WITHOUT PELVIS) 2-3V RIGHT

[x pelvis]
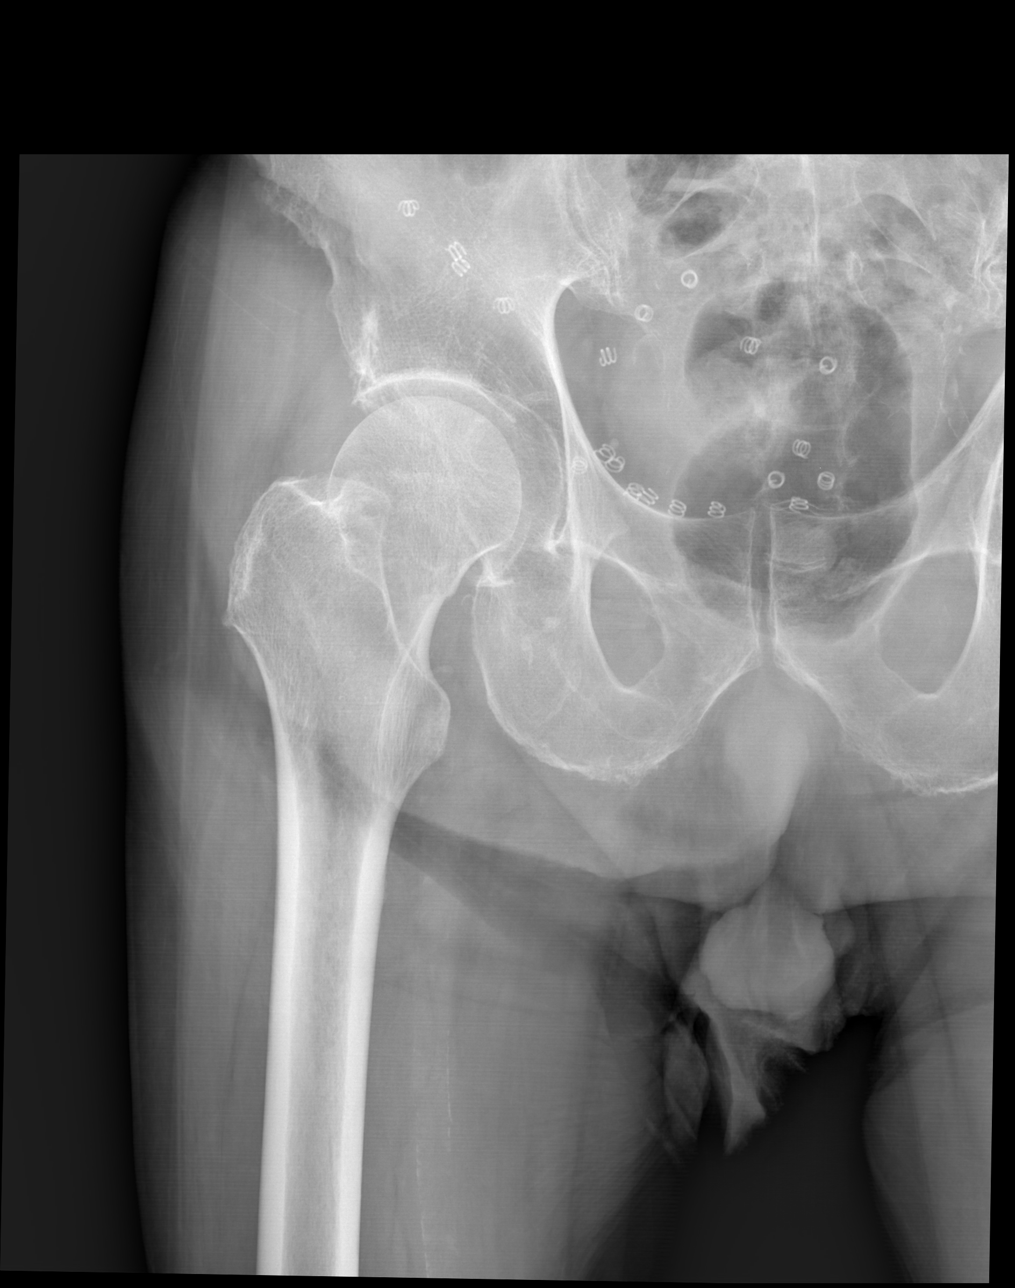

[x hip ap right (1 of 2)]
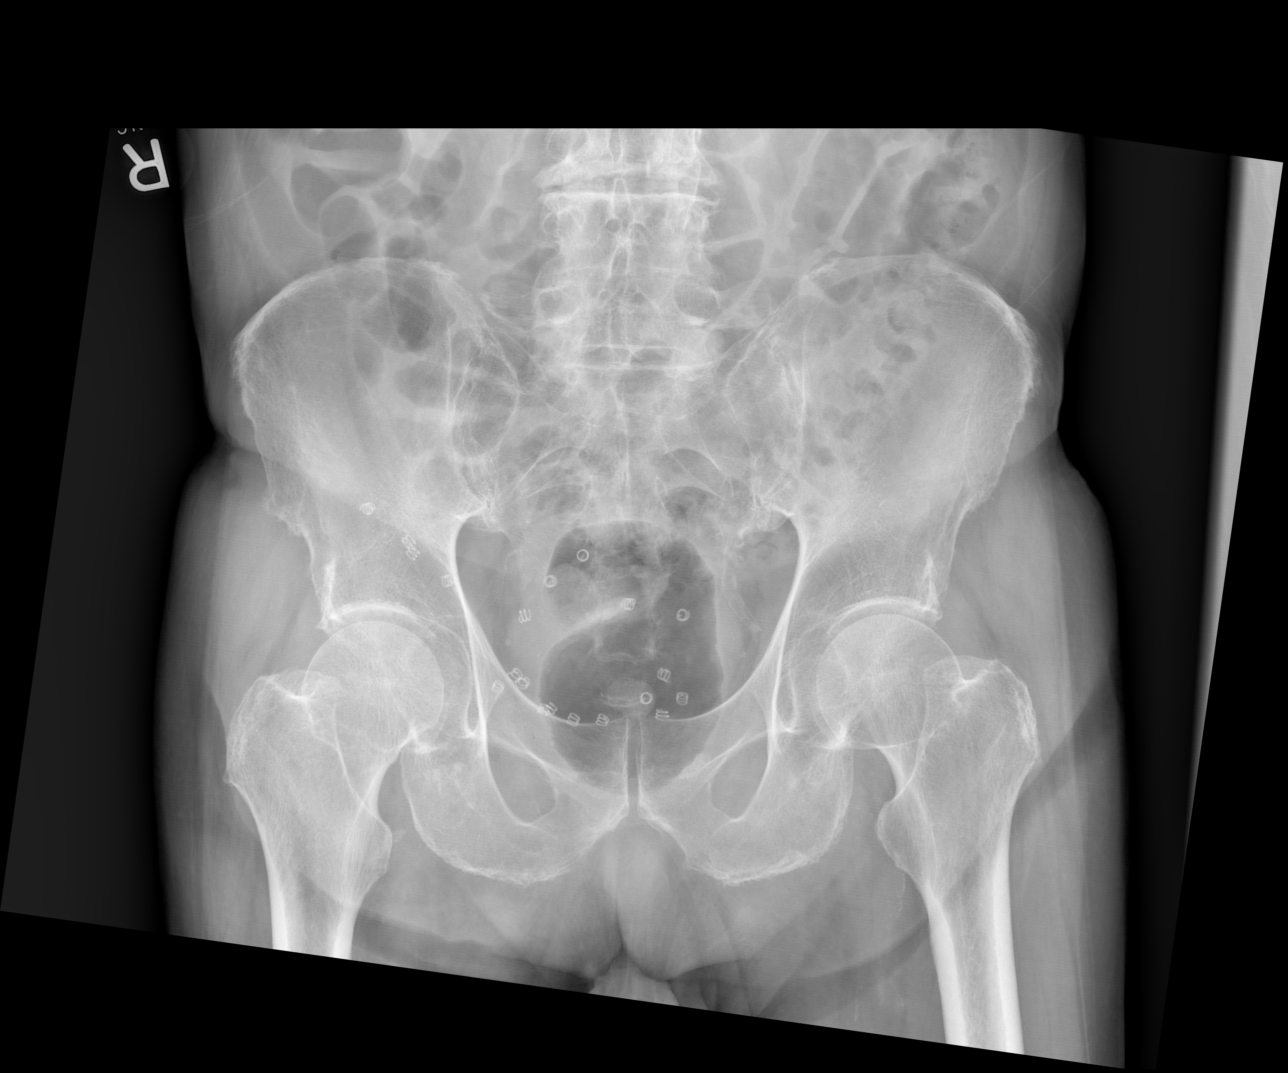

[x hip ap right (2 of 2)]
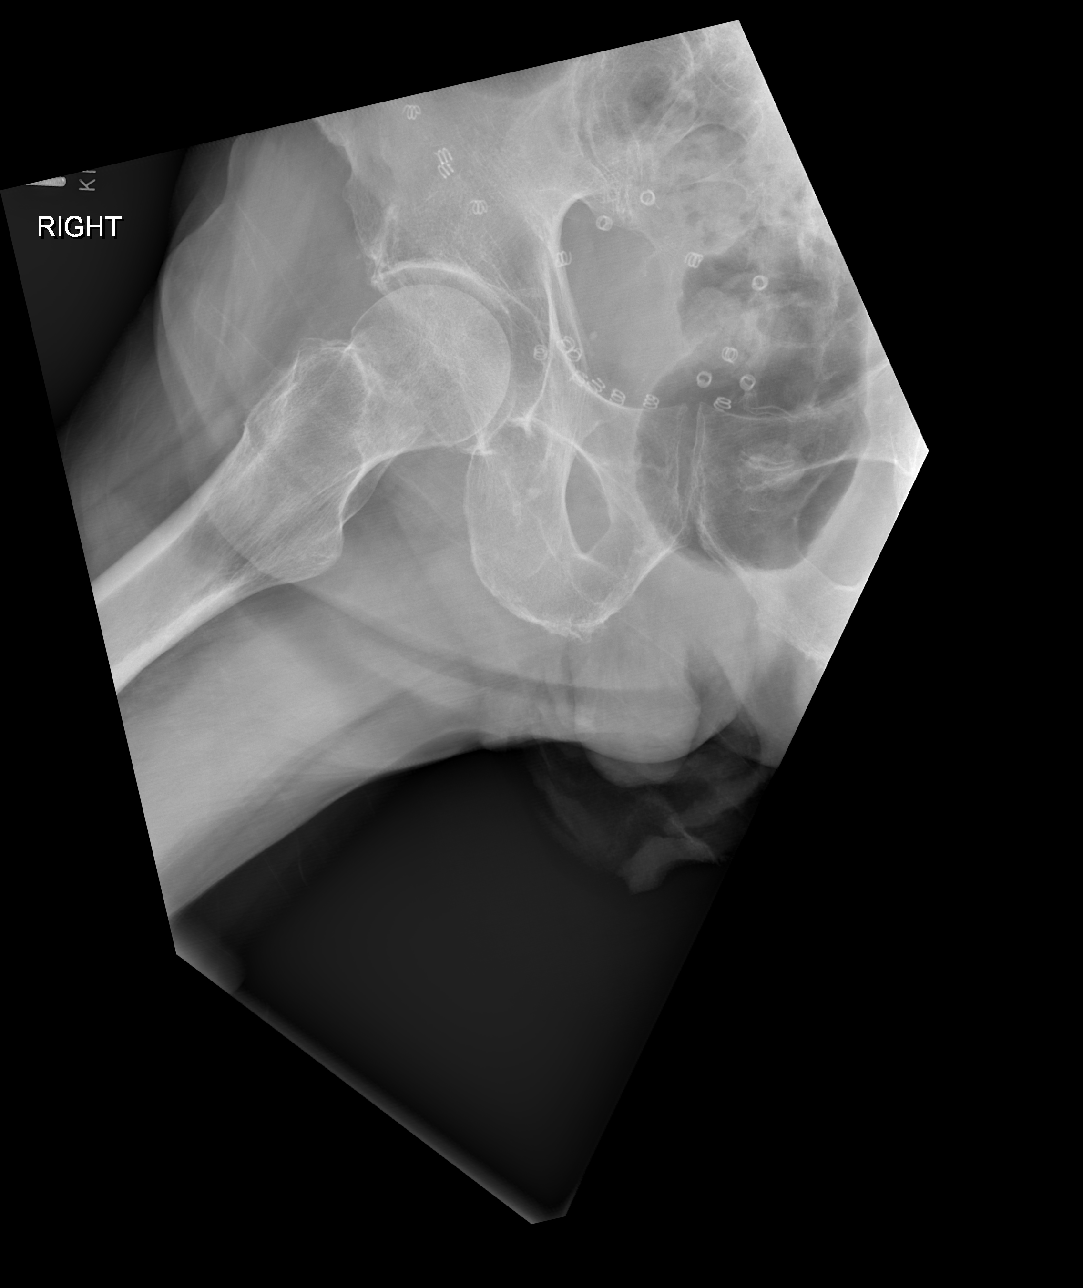

[3 of 3 positions shown; findings below may reference images not displayed]

FINDINGS: Frontal pelvis AP and frog-leg lateral views of the right hip show
no gross fracture or dislocation. Apparent disruption of the
compressive trabecula in the femoral neck raises the question of a
nondisplaced fracture although this is not a definite finding and
may represent superimposition of osteophytes SI joints and symphysis
pubis are unremarkable. Patient has had prior right lower quadrant
ventral mesh placement.
IMPRESSION: Possible, but not definite nondisplaced femoral neck fracture. MRI
recommended to further evaluate.

## 2017-09-10 IMAGING — DX DG PORTABLE PELVIS
1 series · 1 of 1 positions shown · non-contrast
Comparison: Radiography and CT from yesterday

CLINICAL DATA: Preop study for closed right hip fracture. Initial
encounter.

EXAM:
PORTABLE PELVIS 1-2 VIEWS

[pelvis ap]
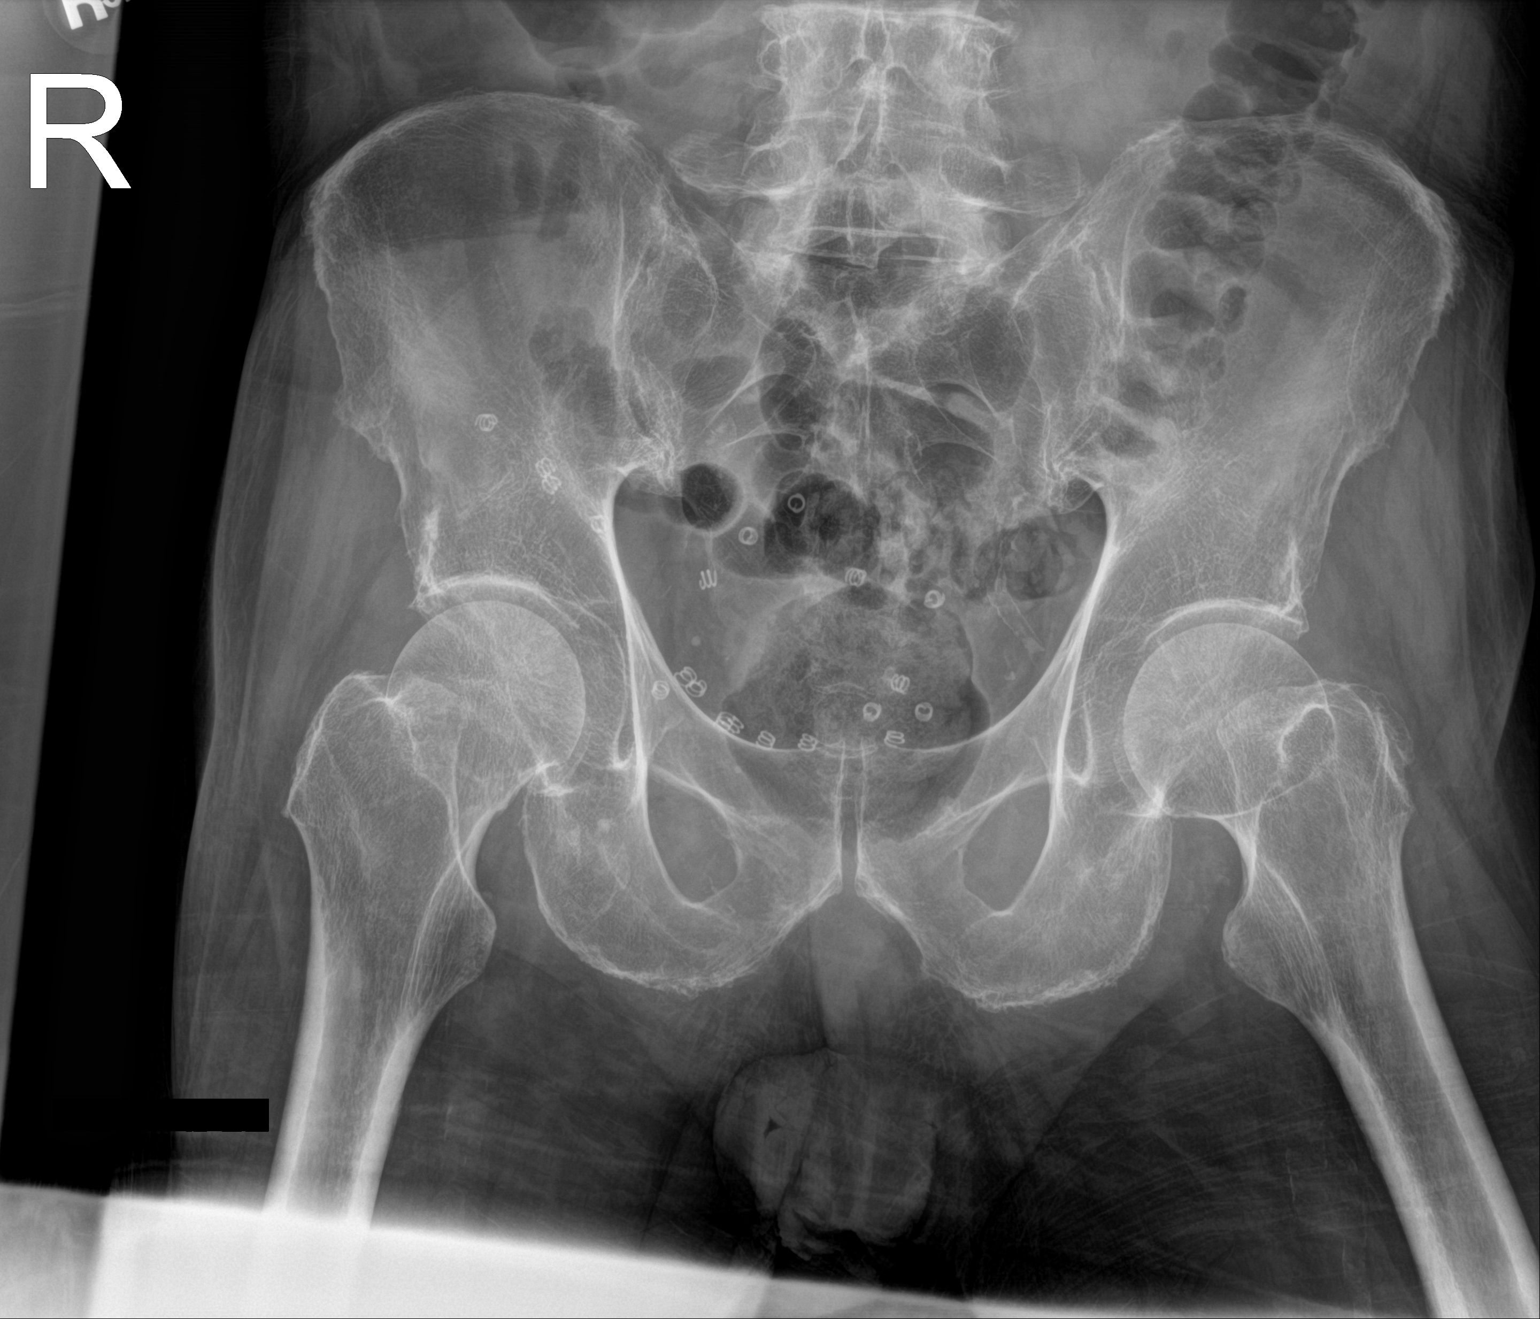

[1 of 1 positions shown; findings below may reference images not displayed]

FINDINGS: Known right femoral neck fracture without signs of interval
displacement. Right femoral neck is foreshortened from leg
positioning. No evidence of pelvic ring fracture or diastasis.

Osteopenia.

Lower abdominal hernia repair with mesh.
IMPRESSION: 1. Known right femoral neck fracture without evidence of interval
displacement.
2. Intact pelvic ring.
